# Patient Record
Sex: Female | Born: 1935 | Race: Black or African American | Hispanic: No | Marital: Single
Health system: Southern US, Community
[De-identification: ages and names within clinical notes are randomized; demographics above are authoritative.]

## PROBLEM LIST (undated history)

## (undated) DIAGNOSIS — I639 Cerebral infarction, unspecified: Secondary | ICD-10-CM

## (undated) DIAGNOSIS — R159 Full incontinence of feces: Secondary | ICD-10-CM

## (undated) DIAGNOSIS — F32A Depression, unspecified: Secondary | ICD-10-CM

## (undated) DIAGNOSIS — R32 Unspecified urinary incontinence: Secondary | ICD-10-CM

## (undated) DIAGNOSIS — R519 Headache, unspecified: Secondary | ICD-10-CM

## (undated) DIAGNOSIS — M2550 Pain in unspecified joint: Secondary | ICD-10-CM

## (undated) DIAGNOSIS — R51 Headache: Secondary | ICD-10-CM

## (undated) DIAGNOSIS — K219 Gastro-esophageal reflux disease without esophagitis: Secondary | ICD-10-CM

## (undated) DIAGNOSIS — N3281 Overactive bladder: Secondary | ICD-10-CM

## (undated) DIAGNOSIS — Z95818 Presence of other cardiac implants and grafts: Secondary | ICD-10-CM

## (undated) DIAGNOSIS — I4891 Unspecified atrial fibrillation: Secondary | ICD-10-CM

## (undated) DIAGNOSIS — R011 Cardiac murmur, unspecified: Secondary | ICD-10-CM

## (undated) DIAGNOSIS — E7800 Pure hypercholesterolemia, unspecified: Secondary | ICD-10-CM

## (undated) DIAGNOSIS — G450 Vertebro-basilar artery syndrome: Secondary | ICD-10-CM

## (undated) DIAGNOSIS — D649 Anemia, unspecified: Secondary | ICD-10-CM

## (undated) DIAGNOSIS — K589 Irritable bowel syndrome without diarrhea: Secondary | ICD-10-CM

## (undated) DIAGNOSIS — K5792 Diverticulitis of intestine, part unspecified, without perforation or abscess without bleeding: Secondary | ICD-10-CM

## (undated) DIAGNOSIS — Z9889 Other specified postprocedural states: Secondary | ICD-10-CM

## (undated) DIAGNOSIS — Z860100 Personal history of colon polyps, unspecified: Secondary | ICD-10-CM

## (undated) DIAGNOSIS — G2581 Restless legs syndrome: Secondary | ICD-10-CM

## (undated) DIAGNOSIS — Z8709 Personal history of other diseases of the respiratory system: Secondary | ICD-10-CM

## (undated) DIAGNOSIS — R112 Nausea with vomiting, unspecified: Secondary | ICD-10-CM

## (undated) DIAGNOSIS — N390 Urinary tract infection, site not specified: Secondary | ICD-10-CM

## (undated) DIAGNOSIS — I10 Essential (primary) hypertension: Secondary | ICD-10-CM

## (undated) DIAGNOSIS — M1990 Unspecified osteoarthritis, unspecified site: Secondary | ICD-10-CM

## (undated) DIAGNOSIS — Z950 Presence of cardiac pacemaker: Secondary | ICD-10-CM

## (undated) DIAGNOSIS — F419 Anxiety disorder, unspecified: Secondary | ICD-10-CM

## (undated) DIAGNOSIS — E039 Hypothyroidism, unspecified: Secondary | ICD-10-CM

## (undated) DIAGNOSIS — M2540 Effusion, unspecified joint: Secondary | ICD-10-CM

## (undated) DIAGNOSIS — Z973 Presence of spectacles and contact lenses: Secondary | ICD-10-CM

## (undated) DIAGNOSIS — F329 Major depressive disorder, single episode, unspecified: Secondary | ICD-10-CM

## (undated) DIAGNOSIS — R351 Nocturia: Secondary | ICD-10-CM

## (undated) HISTORY — PX: EYE SURGERY: SHX253

## (undated) HISTORY — PX: ABDOMINAL HYSTERECTOMY: SHX81

## (undated) HISTORY — PX: BREAST SURGERY: SHX581

## (undated) HISTORY — PX: INSERT / REPLACE / REMOVE PACEMAKER: SUR710

## (undated) HISTORY — PX: OTHER SURGICAL HISTORY: SHX169

## (undated) HISTORY — DX: Vertebro-basilar artery syndrome: G45.0

## (undated) HISTORY — PX: COLONOSCOPY: SHX174

---

## 2014-05-07 DIAGNOSIS — Z95818 Presence of other cardiac implants and grafts: Secondary | ICD-10-CM

## 2014-05-07 HISTORY — DX: Presence of other cardiac implants and grafts: Z95.818

## 2014-05-18 ENCOUNTER — Other Ambulatory Visit (HOSPITAL_COMMUNITY): Payer: Self-pay | Admitting: Interventional Radiology

## 2014-05-18 DIAGNOSIS — I651 Occlusion and stenosis of basilar artery: Secondary | ICD-10-CM

## 2014-05-21 ENCOUNTER — Ambulatory Visit (HOSPITAL_COMMUNITY): Admission: RE | Admit: 2014-05-21 | Payer: Self-pay | Source: Ambulatory Visit

## 2014-05-23 HISTORY — PX: LOOP RECORDER IMPLANT: SHX5954

## 2014-05-25 ENCOUNTER — Ambulatory Visit (HOSPITAL_COMMUNITY)
Admission: RE | Admit: 2014-05-25 | Discharge: 2014-05-25 | Disposition: A | Discharge status: Home / Self Care | Payer: Medicare HMO | Source: Ambulatory Visit | Attending: Interventional Radiology | Admitting: Interventional Radiology

## 2014-05-25 ENCOUNTER — Other Ambulatory Visit: Payer: Self-pay | Admitting: Radiology

## 2014-05-25 DIAGNOSIS — Z79899 Other long term (current) drug therapy: Secondary | ICD-10-CM | POA: Insufficient documentation

## 2014-05-25 DIAGNOSIS — G2581 Restless legs syndrome: Secondary | ICD-10-CM | POA: Diagnosis not present

## 2014-05-25 DIAGNOSIS — F329 Major depressive disorder, single episode, unspecified: Secondary | ICD-10-CM | POA: Diagnosis not present

## 2014-05-25 DIAGNOSIS — I6501 Occlusion and stenosis of right vertebral artery: Secondary | ICD-10-CM | POA: Insufficient documentation

## 2014-05-25 DIAGNOSIS — Z7982 Long term (current) use of aspirin: Secondary | ICD-10-CM | POA: Diagnosis not present

## 2014-05-25 DIAGNOSIS — Z8673 Personal history of transient ischemic attack (TIA), and cerebral infarction without residual deficits: Secondary | ICD-10-CM | POA: Diagnosis not present

## 2014-05-25 DIAGNOSIS — R110 Nausea: Secondary | ICD-10-CM | POA: Insufficient documentation

## 2014-05-25 DIAGNOSIS — M170 Bilateral primary osteoarthritis of knee: Secondary | ICD-10-CM | POA: Diagnosis not present

## 2014-05-25 DIAGNOSIS — Z7902 Long term (current) use of antithrombotics/antiplatelets: Secondary | ICD-10-CM | POA: Insufficient documentation

## 2014-05-25 DIAGNOSIS — E780 Pure hypercholesterolemia: Secondary | ICD-10-CM | POA: Diagnosis not present

## 2014-05-25 DIAGNOSIS — I651 Occlusion and stenosis of basilar artery: Secondary | ICD-10-CM | POA: Insufficient documentation

## 2014-05-25 DIAGNOSIS — R42 Dizziness and giddiness: Secondary | ICD-10-CM | POA: Diagnosis present

## 2014-05-25 DIAGNOSIS — I4891 Unspecified atrial fibrillation: Secondary | ICD-10-CM | POA: Insufficient documentation

## 2014-05-25 LAB — PLATELET INHIBITION P2Y12: PLATELET FUNCTION P2Y12: 187 [PRU] — AB (ref 194–418)

## 2014-06-05 ENCOUNTER — Other Ambulatory Visit (HOSPITAL_COMMUNITY): Payer: Self-pay | Admitting: Interventional Radiology

## 2014-06-05 DIAGNOSIS — I651 Occlusion and stenosis of basilar artery: Secondary | ICD-10-CM

## 2014-07-02 ENCOUNTER — Ambulatory Visit (HOSPITAL_COMMUNITY): Admission: RE | Admit: 2014-07-02 | Payer: Medicare HMO | Source: Ambulatory Visit

## 2014-07-05 ENCOUNTER — Encounter (HOSPITAL_COMMUNITY): Payer: Self-pay | Admitting: Pharmacy Technician

## 2014-07-05 ENCOUNTER — Other Ambulatory Visit: Payer: Self-pay | Admitting: Radiology

## 2014-07-11 ENCOUNTER — Other Ambulatory Visit (HOSPITAL_COMMUNITY): Payer: Self-pay | Admitting: *Deleted

## 2014-07-11 ENCOUNTER — Encounter (HOSPITAL_COMMUNITY): Payer: Self-pay

## 2014-07-11 ENCOUNTER — Encounter (HOSPITAL_COMMUNITY)
Admission: RE | Admit: 2014-07-11 | Discharge: 2014-07-11 | Disposition: A | Discharge status: Home / Self Care | Payer: Medicare HMO | Source: Ambulatory Visit | Attending: Interventional Radiology | Admitting: Interventional Radiology

## 2014-07-11 DIAGNOSIS — E039 Hypothyroidism, unspecified: Secondary | ICD-10-CM | POA: Insufficient documentation

## 2014-07-11 DIAGNOSIS — Z01812 Encounter for preprocedural laboratory examination: Secondary | ICD-10-CM | POA: Insufficient documentation

## 2014-07-11 DIAGNOSIS — Z8673 Personal history of transient ischemic attack (TIA), and cerebral infarction without residual deficits: Secondary | ICD-10-CM | POA: Diagnosis not present

## 2014-07-11 DIAGNOSIS — I4891 Unspecified atrial fibrillation: Secondary | ICD-10-CM | POA: Diagnosis not present

## 2014-07-11 DIAGNOSIS — Z01818 Encounter for other preprocedural examination: Secondary | ICD-10-CM | POA: Insufficient documentation

## 2014-07-11 DIAGNOSIS — I10 Essential (primary) hypertension: Secondary | ICD-10-CM | POA: Diagnosis not present

## 2014-07-11 DIAGNOSIS — Z7902 Long term (current) use of antithrombotics/antiplatelets: Secondary | ICD-10-CM | POA: Diagnosis not present

## 2014-07-11 DIAGNOSIS — I651 Occlusion and stenosis of basilar artery: Secondary | ICD-10-CM | POA: Diagnosis not present

## 2014-07-11 HISTORY — DX: Cardiac murmur, unspecified: R01.1

## 2014-07-11 HISTORY — DX: Urinary tract infection, site not specified: N39.0

## 2014-07-11 HISTORY — DX: Unspecified atrial fibrillation: I48.91

## 2014-07-11 HISTORY — DX: Presence of other cardiac implants and grafts: Z95.818

## 2014-07-11 HISTORY — DX: Pure hypercholesterolemia, unspecified: E78.00

## 2014-07-11 HISTORY — DX: Full incontinence of feces: R15.9

## 2014-07-11 HISTORY — DX: Anxiety disorder, unspecified: F41.9

## 2014-07-11 HISTORY — DX: Major depressive disorder, single episode, unspecified: F32.9

## 2014-07-11 HISTORY — DX: Cerebral infarction, unspecified: I63.9

## 2014-07-11 HISTORY — DX: Nausea with vomiting, unspecified: R11.2

## 2014-07-11 HISTORY — DX: Restless legs syndrome: G25.81

## 2014-07-11 HISTORY — DX: Unspecified osteoarthritis, unspecified site: M19.90

## 2014-07-11 HISTORY — DX: Hypothyroidism, unspecified: E03.9

## 2014-07-11 HISTORY — DX: Essential (primary) hypertension: I10

## 2014-07-11 HISTORY — DX: Depression, unspecified: F32.A

## 2014-07-11 HISTORY — DX: Other specified postprocedural states: Z98.890

## 2014-07-11 LAB — COMPREHENSIVE METABOLIC PANEL
ALK PHOS: 79 U/L (ref 39–117)
ALT: 15 U/L (ref 0–35)
AST: 23 U/L (ref 0–37)
Albumin: 3.6 g/dL (ref 3.5–5.2)
Anion gap: 7 (ref 5–15)
BILIRUBIN TOTAL: 0.5 mg/dL (ref 0.3–1.2)
BUN: 8 mg/dL (ref 6–23)
CO2: 30 mmol/L (ref 19–32)
Calcium: 9.3 mg/dL (ref 8.4–10.5)
Chloride: 99 mmol/L (ref 96–112)
Creatinine, Ser: 0.97 mg/dL (ref 0.50–1.10)
GFR, EST AFRICAN AMERICAN: 60 mL/min — AB (ref >90)
GFR, EST NON AFRICAN AMERICAN: 52 mL/min — AB (ref >90)
GLUCOSE: 88 mg/dL (ref 70–99)
POTASSIUM: 4.1 mmol/L (ref 3.5–5.1)
Sodium: 136 mmol/L (ref 135–145)
Total Protein: 6.6 g/dL (ref 6.0–8.3)

## 2014-07-11 LAB — CBC WITH DIFFERENTIAL/PLATELET
Basophils Absolute: 0 10*3/uL (ref 0.0–0.1)
Basophils Relative: 0 % (ref 0–1)
EOS ABS: 0.3 10*3/uL (ref 0.0–0.7)
Eosinophils Relative: 5 % (ref 0–5)
HCT: 35.7 % — ABNORMAL LOW (ref 36.0–46.0)
HEMOGLOBIN: 12.1 g/dL (ref 12.0–15.0)
LYMPHS ABS: 1.9 10*3/uL (ref 0.7–4.0)
LYMPHS PCT: 35 % (ref 12–46)
MCH: 28.3 pg (ref 26.0–34.0)
MCHC: 33.9 g/dL (ref 30.0–36.0)
MCV: 83.6 fL (ref 78.0–100.0)
MONOS PCT: 8 % (ref 3–12)
Monocytes Absolute: 0.5 10*3/uL (ref 0.1–1.0)
NEUTROS PCT: 52 % (ref 43–77)
Neutro Abs: 2.9 10*3/uL (ref 1.7–7.7)
Platelets: 168 10*3/uL (ref 150–400)
RBC: 4.27 MIL/uL (ref 3.87–5.11)
RDW: 13.2 % (ref 11.5–15.5)
WBC: 5.6 10*3/uL (ref 4.0–10.5)

## 2014-07-11 LAB — APTT: aPTT: 33 seconds (ref 24–37)

## 2014-07-11 LAB — PROTIME-INR
INR: 1.05 (ref 0.00–1.49)
Prothrombin Time: 13.9 seconds (ref 11.6–15.2)

## 2014-07-11 LAB — PLATELET INHIBITION P2Y12: Platelet Function  P2Y12: 248 [PRU] (ref 194–418)

## 2014-07-11 NOTE — Progress Notes (Signed)
Pt denies chest pain or sob. Only "heart" issue is that she was dx'ed with A-fib in February. Had loop recorder placed then. Pt's cardiologist is Dr. Elonda Husky with [facility] in [facility].

## 2014-07-11 NOTE — Pre-Procedure Instructions (Signed)
Jade Juarez  07/11/2014   Your procedure is scheduled on:  Monday, July 16, 2014 at 8:30 AM.   Report to [facility] Entrance "A" Admitting Office at 6:30 AM.   Call this number if you have problems the morning of surgery: [phone]               Any questions prior to day of surgery, please call [phone] between 8 & 4 PM.   Remember:   Do not eat food or drink liquids after midnight Sunday, 07/15/14.   Take these medicines the morning of surgery with A SIP OF WATER: Aspirin, Plavix, Diltiazem (Cardizem CD), Gabapentin (Neurontin), Levothyroxine (Synthroid), Sertraline (Zoloft)   Do not wear jewelry, make-up or nail polish.  Do not wear lotions, powders, or perfumes. You may wear deodorant.  Do not shave 48 hours prior to surgery.   Do not bring valuables to the hospital.  [facility] is not responsible                  for any belongings or valuables.               Contacts, dentures or bridgework may not be worn into surgery.  Leave suitcase in the car. After surgery it may be brought to your room.  For patients admitted to the hospital, discharge time is determined by your                treatment team.               Special Instructions: [place] - Preparing for Surgery  Before surgery, you can play an important role.  Because skin is not sterile, your skin needs to be as free of germs as possible.  You can reduce the number of germs on you skin by washing with CHG (chlorahexidine gluconate) soap before surgery.  CHG is an antiseptic cleaner which kills germs and bonds with the skin to continue killing germs even after washing.  Please DO NOT use if you have an allergy to CHG or antibacterial soaps.  If your skin becomes reddened/irritated stop using the CHG and inform your nurse when you arrive at Short Stay.  Do not shave (including legs and underarms) for at least 48 hours prior to the first CHG shower.  You may shave your face.  Please follow these  instructions carefully:   1.  Shower with CHG Soap the night before surgery and the                                morning of Surgery.  2.  If you choose to wash your hair, wash your hair first as usual with your       normal shampoo.  3.  After you shampoo, rinse your hair and body thoroughly to remove the                      Shampoo.  4.  Use CHG as you would any other liquid soap.  You can apply chg directly       to the skin and wash gently with scrungie or a clean washcloth.  5.  Apply the CHG Soap to your body ONLY FROM THE NECK DOWN.        Do not use on open wounds or open sores.  Avoid contact with your eyes, ears, mouth and genitals (private parts).  Wash genitals (private parts) with your normal soap.  6.  Wash thoroughly, paying special attention to the area where your surgery        will be performed.  7.  Thoroughly rinse your body with warm water from the neck down.  8.  DO NOT shower/wash with your normal soap after using and rinsing off       the CHG Soap.  9.  Pat yourself dry with a clean towel.            10.  Wear clean pajamas.            11.  Place clean sheets on your bed the night of your first shower and do not        sleep with pets.  Day of Surgery  Do not apply any lotions the morning of surgery.  Please wear clean clothes to the hospital.     Please read over the following fact sheets that you were given: Pain Booklet, Coughing and Deep Breathing and Surgical Site Infection Prevention

## 2014-07-12 ENCOUNTER — Encounter (HOSPITAL_COMMUNITY): Payer: Self-pay | Admitting: Emergency Medicine

## 2014-07-12 ENCOUNTER — Other Ambulatory Visit: Payer: Self-pay | Admitting: Radiology

## 2014-07-12 NOTE — Progress Notes (Addendum)
Anesthesia Chart Review:  Pt is 81 year old female scheduled for cerebral arteriogram with possible angioplasty/stent of basilar artery stenosis on 07/16/2014 with Dr. Estanislado Pandy.   Cardiologist is Dr. Elonda Husky of [facility].   PMH includes: HTN, atrial fibrillation (per pt, new dx 05/2014), s/p placement loop recorder 05/2014, heart murmur, stroke, hypothyroidism. Never smoker. BMI 31.   Medications include: Plavix. Pt is not to stop plavix.   Preoperative labs reviewed.    EKG: one was obtained from her cardiologist's office, but is essentially illegible.  Will need to obtain DOS.   Echo 12/27/2013: -Mild concentric LV hypertrophy  -Normal LV systolic function with no appreciable segmental abnormality -EF is visually estimated at 54-00% -Grade I diastolic dysfunction -Normal LV structure and function -Mild aortic regurgitation  Pt last saw Dr. Elonda Husky 06/27/2014. Her loop recorder demonstrated multiple episodes of atrial fibrillation as long as several hours each. He recommended she stop plavix and start eliquis for stroke prevention, but I do not see that this has been done yet. His note indicates he is aware of her upcoming procedure.   If EKG stable DOS, I anticipate pt can proceed with surgery as scheduled.   Willeen Cass, FNP-BC [facility] Short Stay Surgical Center/Anesthesiology Phone: [phone] 07/13/2014 1:43 PM

## 2014-07-13 ENCOUNTER — Encounter (HOSPITAL_COMMUNITY): Payer: Self-pay

## 2014-07-16 ENCOUNTER — Inpatient Hospital Stay (HOSPITAL_COMMUNITY): Admission: RE | Admit: 2014-07-16 | Payer: Medicare HMO | Source: Ambulatory Visit

## 2014-07-16 ENCOUNTER — Ambulatory Visit (HOSPITAL_COMMUNITY): Admission: RE | Admit: 2014-07-16 | Payer: Medicare HMO | Source: Ambulatory Visit | Admitting: Interventional Radiology

## 2014-07-16 ENCOUNTER — Encounter (HOSPITAL_COMMUNITY): Admission: RE | Payer: Self-pay | Source: Ambulatory Visit

## 2014-07-16 SURGERY — RADIOLOGY WITH ANESTHESIA
Anesthesia: General

## 2014-07-30 ENCOUNTER — Ambulatory Visit (HOSPITAL_COMMUNITY): Admission: RE | Admit: 2014-07-30 | Payer: Medicare HMO | Source: Ambulatory Visit

## 2014-08-02 ENCOUNTER — Other Ambulatory Visit: Payer: Self-pay | Admitting: Radiology

## 2014-08-09 ENCOUNTER — Encounter (HOSPITAL_COMMUNITY)
Admission: RE | Admit: 2014-08-09 | Discharge: 2014-08-09 | Disposition: A | Discharge status: Home / Self Care | Payer: Medicare HMO | Source: Ambulatory Visit | Attending: Interventional Radiology | Admitting: Interventional Radiology

## 2014-08-09 ENCOUNTER — Encounter (HOSPITAL_COMMUNITY): Payer: Self-pay

## 2014-08-09 DIAGNOSIS — I10 Essential (primary) hypertension: Secondary | ICD-10-CM | POA: Insufficient documentation

## 2014-08-09 DIAGNOSIS — E039 Hypothyroidism, unspecified: Secondary | ICD-10-CM | POA: Diagnosis not present

## 2014-08-09 DIAGNOSIS — Z7902 Long term (current) use of antithrombotics/antiplatelets: Secondary | ICD-10-CM | POA: Insufficient documentation

## 2014-08-09 DIAGNOSIS — Z01812 Encounter for preprocedural laboratory examination: Secondary | ICD-10-CM | POA: Insufficient documentation

## 2014-08-09 DIAGNOSIS — Z01818 Encounter for other preprocedural examination: Secondary | ICD-10-CM | POA: Diagnosis present

## 2014-08-09 DIAGNOSIS — Z8673 Personal history of transient ischemic attack (TIA), and cerebral infarction without residual deficits: Secondary | ICD-10-CM | POA: Diagnosis not present

## 2014-08-09 DIAGNOSIS — I651 Occlusion and stenosis of basilar artery: Secondary | ICD-10-CM | POA: Diagnosis not present

## 2014-08-09 DIAGNOSIS — I4891 Unspecified atrial fibrillation: Secondary | ICD-10-CM | POA: Insufficient documentation

## 2014-08-09 HISTORY — DX: Headache: R51

## 2014-08-09 HISTORY — DX: Personal history of colonic polyps: Z86.010

## 2014-08-09 HISTORY — DX: Overactive bladder: N32.81

## 2014-08-09 HISTORY — DX: Nocturia: R35.1

## 2014-08-09 HISTORY — DX: Unspecified urinary incontinence: R32

## 2014-08-09 HISTORY — DX: Diverticulitis of intestine, part unspecified, without perforation or abscess without bleeding: K57.92

## 2014-08-09 HISTORY — DX: Effusion, unspecified joint: M25.40

## 2014-08-09 HISTORY — DX: Headache, unspecified: R51.9

## 2014-08-09 HISTORY — DX: Personal history of other diseases of the respiratory system: Z87.09

## 2014-08-09 HISTORY — DX: Gastro-esophageal reflux disease without esophagitis: K21.9

## 2014-08-09 HISTORY — DX: Anemia, unspecified: D64.9

## 2014-08-09 HISTORY — DX: Personal history of colon polyps, unspecified: Z86.0100

## 2014-08-09 HISTORY — DX: Pain in unspecified joint: M25.50

## 2014-08-09 LAB — PLATELET INHIBITION P2Y12: Platelet Function  P2Y12: 215 [PRU] (ref 194–418)

## 2014-08-09 LAB — BASIC METABOLIC PANEL
Anion gap: 9 (ref 5–15)
BUN: 11 mg/dL (ref 6–20)
CO2: 26 mmol/L (ref 22–32)
CREATININE: 0.89 mg/dL (ref 0.44–1.00)
Calcium: 9.4 mg/dL (ref 8.9–10.3)
Chloride: 100 mmol/L — ABNORMAL LOW (ref 101–111)
GFR, EST NON AFRICAN AMERICAN: 57 mL/min — AB (ref >60)
GLUCOSE: 100 mg/dL — AB (ref 70–99)
Potassium: 4.1 mmol/L (ref 3.5–5.1)
Sodium: 135 mmol/L (ref 135–145)

## 2014-08-09 LAB — CBC
HCT: 37.2 % (ref 36.0–46.0)
Hemoglobin: 12.7 g/dL (ref 12.0–15.0)
MCH: 28.7 pg (ref 26.0–34.0)
MCHC: 34.1 g/dL (ref 30.0–36.0)
MCV: 84.2 fL (ref 78.0–100.0)
PLATELETS: 183 10*3/uL (ref 150–400)
RBC: 4.42 MIL/uL (ref 3.87–5.11)
RDW: 13.6 % (ref 11.5–15.5)
WBC: 6.2 10*3/uL (ref 4.0–10.5)

## 2014-08-09 NOTE — Progress Notes (Signed)
Dr.Tyson and Dr.mCGukin is with [facility] and last visit about 6wk-2 months ago-to request records  Echo done within past 2 yrs ago to be requested  Stress test to be requested   Denies ever having a heart cath  EKG to be requested from [facility]  CXR to be requested from [facility]

## 2014-08-09 NOTE — Pre-Procedure Instructions (Signed)
Nomie Buchberger  08/09/2014   Your procedure is scheduled on:  Wed, May 11 @ 8:30 AM  Report to [facility] Entrance A  at 6:30 AM.  Call this number if you have problems the morning of surgery: [phone]   Remember:   Do not eat food or drink liquids after midnight.   Take these medicines the morning of surgery with A SIP OF WATER: Aspirin,Plavix,Synthroid(Levothyroxine),and Sertraline(Zoloft)               No Goody's,BC's,Aleve,Ibuprofen,Fish Oil,or any Herbal Medications.   Do not wear jewelry, make-up or nail polish.  Do not wear lotions, powders, or perfumes. You may wear deodorant.  Do not shave 48 hours prior to surgery.   Do not bring valuables to the hospital.  [facility] is not responsible                  for any belongings or valuables.               Contacts, dentures or bridgework may not be worn into surgery.  Leave suitcase in the car. After surgery it may be brought to your room.  For patients admitted to the hospital, discharge time is determined by your                treatment team.               Patients discharged the day of surgery will not be allowed to drive  home.    Special Instructions:  [place] - Preparing for Surgery  Before surgery, you can play an important role.  Because skin is not sterile, your skin needs to be as free of germs as possible.  You can reduce the number of germs on you skin by washing with CHG (chlorahexidine gluconate) soap before surgery.  CHG is an antiseptic cleaner which kills germs and bonds with the skin to continue killing germs even after washing.  Please DO NOT use if you have an allergy to CHG or antibacterial soaps.  If your skin becomes reddened/irritated stop using the CHG and inform your nurse when you arrive at Short Stay.  Do not shave (including legs and underarms) for at least 48 hours prior to the first CHG shower.  You may shave your face.  Please follow these instructions carefully:   1.  Shower with CHG Soap  the night before surgery and the                                morning of Surgery.  2.  If you choose to wash your hair, wash your hair first as usual with your       normal shampoo.  3.  After you shampoo, rinse your hair and body thoroughly to remove the                      Shampoo.  4.  Use CHG as you would any other liquid soap.  You can apply chg directly       to the skin and wash gently with scrungie or a clean washcloth.  5.  Apply the CHG Soap to your body ONLY FROM THE NECK DOWN.        Do not use on open wounds or open sores.  Avoid contact with your eyes,       ears, mouth and genitals (private parts).  Wash genitals (private parts)       with your normal soap.  6.  Wash thoroughly, paying special attention to the area where your surgery        will be performed.  7.  Thoroughly rinse your body with warm water from the neck down.  8.  DO NOT shower/wash with your normal soap after using and rinsing off       the CHG Soap.  9.  Pat yourself dry with a clean towel.            10.  Wear clean pajamas.            11.  Place clean sheets on your bed the night of your first shower and do not        sleep with pets.  Day of Surgery  Do not apply any lotions/deoderants the morning of surgery.  Please wear clean clothes to the hospital/surgery center.     Please read over the following fact sheets that you were given: Pain Booklet, Coughing and Deep Breathing and Surgical Site Infection Prevention

## 2014-08-10 NOTE — Progress Notes (Addendum)
Anesthesia Chart Review:  Pt is 81 year old female scheduled for endovascular revascularization of the mid basilar artery for 95% plus stenosis on 08/15/2014 with Dr. Estanislado Pandy.   See my note dated  07/12/2014.   Cardiologist is Dr. Elonda Husky of [facility].   PMH includes: HTN, atrial fibrillation (per pt, new dx 05/2014), s/p placement loop recorder 05/2014, heart murmur, stroke, hypothyroidism. Never smoker. BMI 31.   Medications include: Plavix. Pt is not to stop plavix.   Preoperative labs reviewed.   Chest x-ray report 07/26/2014 reviewed. Mild hyperinflation. No active disease.   EKG: one was obtained from her cardiologist's office, but is essentially illegible. Will need to obtain DOS.   Requested updates from [facility]. Pt had loop recorder check on 07/23/2014, otherwise no updates.   If EKG acceptable DOS, I anticipate pt can proceed as scheduled.   Willeen Cass, FNP-BC [facility] Short Stay Surgical Center/Anesthesiology Phone: [phone] 08/14/2014 2:34 PM

## 2014-08-14 ENCOUNTER — Other Ambulatory Visit (HOSPITAL_COMMUNITY): Payer: Self-pay | Admitting: Physician Assistant

## 2014-08-14 ENCOUNTER — Other Ambulatory Visit: Payer: Self-pay | Admitting: Radiology

## 2014-08-15 ENCOUNTER — Encounter (HOSPITAL_COMMUNITY): Payer: Self-pay | Admitting: Anesthesiology

## 2014-08-15 ENCOUNTER — Ambulatory Visit (HOSPITAL_COMMUNITY): Payer: Medicare HMO | Admitting: Emergency Medicine

## 2014-08-15 ENCOUNTER — Other Ambulatory Visit: Payer: Self-pay

## 2014-08-15 ENCOUNTER — Encounter (HOSPITAL_COMMUNITY): Payer: Self-pay

## 2014-08-15 ENCOUNTER — Ambulatory Visit (HOSPITAL_COMMUNITY)
Admission: RE | Admit: 2014-08-15 | Discharge: 2014-08-15 | Disposition: A | Discharge status: Home / Self Care | Payer: Medicare HMO | Source: Ambulatory Visit | Attending: Interventional Radiology | Admitting: Interventional Radiology

## 2014-08-15 ENCOUNTER — Encounter (HOSPITAL_COMMUNITY)
Admission: RE | Disposition: A | Discharge status: Home / Self Care | Payer: Self-pay | Source: Ambulatory Visit | Attending: Interventional Radiology

## 2014-08-15 ENCOUNTER — Ambulatory Visit (HOSPITAL_COMMUNITY): Payer: Medicare HMO | Admitting: Anesthesiology

## 2014-08-15 DIAGNOSIS — I4891 Unspecified atrial fibrillation: Secondary | ICD-10-CM | POA: Insufficient documentation

## 2014-08-15 DIAGNOSIS — E039 Hypothyroidism, unspecified: Secondary | ICD-10-CM | POA: Diagnosis not present

## 2014-08-15 DIAGNOSIS — D649 Anemia, unspecified: Secondary | ICD-10-CM | POA: Diagnosis not present

## 2014-08-15 DIAGNOSIS — Z888 Allergy status to other drugs, medicaments and biological substances status: Secondary | ICD-10-CM | POA: Insufficient documentation

## 2014-08-15 DIAGNOSIS — Z7901 Long term (current) use of anticoagulants: Secondary | ICD-10-CM | POA: Insufficient documentation

## 2014-08-15 DIAGNOSIS — M13862 Other specified arthritis, left knee: Secondary | ICD-10-CM | POA: Insufficient documentation

## 2014-08-15 DIAGNOSIS — K219 Gastro-esophageal reflux disease without esophagitis: Secondary | ICD-10-CM | POA: Diagnosis not present

## 2014-08-15 DIAGNOSIS — I10 Essential (primary) hypertension: Secondary | ICD-10-CM | POA: Diagnosis not present

## 2014-08-15 DIAGNOSIS — Z881 Allergy status to other antibiotic agents status: Secondary | ICD-10-CM | POA: Insufficient documentation

## 2014-08-15 DIAGNOSIS — Z886 Allergy status to analgesic agent status: Secondary | ICD-10-CM | POA: Diagnosis not present

## 2014-08-15 DIAGNOSIS — G2581 Restless legs syndrome: Secondary | ICD-10-CM | POA: Diagnosis not present

## 2014-08-15 DIAGNOSIS — Z95810 Presence of automatic (implantable) cardiac defibrillator: Secondary | ICD-10-CM | POA: Diagnosis not present

## 2014-08-15 DIAGNOSIS — F419 Anxiety disorder, unspecified: Secondary | ICD-10-CM | POA: Insufficient documentation

## 2014-08-15 DIAGNOSIS — Z7982 Long term (current) use of aspirin: Secondary | ICD-10-CM | POA: Diagnosis not present

## 2014-08-15 DIAGNOSIS — E780 Pure hypercholesterolemia: Secondary | ICD-10-CM | POA: Diagnosis not present

## 2014-08-15 DIAGNOSIS — Z86010 Personal history of colonic polyps: Secondary | ICD-10-CM | POA: Diagnosis not present

## 2014-08-15 DIAGNOSIS — M13861 Other specified arthritis, right knee: Secondary | ICD-10-CM | POA: Diagnosis not present

## 2014-08-15 DIAGNOSIS — Z90710 Acquired absence of both cervix and uterus: Secondary | ICD-10-CM | POA: Insufficient documentation

## 2014-08-15 DIAGNOSIS — I668 Occlusion and stenosis of other cerebral arteries: Secondary | ICD-10-CM | POA: Diagnosis not present

## 2014-08-15 DIAGNOSIS — Z87440 Personal history of urinary (tract) infections: Secondary | ICD-10-CM | POA: Diagnosis not present

## 2014-08-15 DIAGNOSIS — F329 Major depressive disorder, single episode, unspecified: Secondary | ICD-10-CM | POA: Insufficient documentation

## 2014-08-15 DIAGNOSIS — I651 Occlusion and stenosis of basilar artery: Secondary | ICD-10-CM

## 2014-08-15 DIAGNOSIS — Z8673 Personal history of transient ischemic attack (TIA), and cerebral infarction without residual deficits: Secondary | ICD-10-CM | POA: Diagnosis not present

## 2014-08-15 DIAGNOSIS — R42 Dizziness and giddiness: Secondary | ICD-10-CM | POA: Diagnosis present

## 2014-08-15 HISTORY — PX: RADIOLOGY WITH ANESTHESIA: SHX6223

## 2014-08-15 LAB — PLATELET INHIBITION P2Y12: PLATELET FUNCTION P2Y12: 191 [PRU] — AB (ref 194–418)

## 2014-08-15 SURGERY — RADIOLOGY WITH ANESTHESIA
Anesthesia: Monitor Anesthesia Care

## 2014-08-15 MED ORDER — IOHEXOL 300 MG/ML  SOLN
200.0000 mL | Freq: Once | INTRAMUSCULAR | Status: AC | PRN
  Administered 2014-08-15: 90 mL via INTRA_ARTERIAL

## 2014-08-15 MED ORDER — LIDOCAINE HCL 1 % IJ SOLN
INTRAMUSCULAR | Status: AC
  Filled 2014-08-15: qty 20

## 2014-08-15 MED ORDER — HYDRALAZINE HCL 20 MG/ML IJ SOLN
INTRAMUSCULAR | Status: DC | PRN
  Administered 2014-08-15: 8 mg via INTRAVENOUS

## 2014-08-15 MED ORDER — LACTATED RINGERS IV SOLN
INTRAVENOUS | Status: DC
  Administered 2014-08-15 (×2): via INTRAVENOUS

## 2014-08-15 MED ORDER — FENTANYL CITRATE (PF) 100 MCG/2ML IJ SOLN
INTRAMUSCULAR | Status: DC | PRN
  Administered 2014-08-15: 50 ug via INTRAVENOUS

## 2014-08-15 MED ORDER — GLYCOPYRROLATE 0.2 MG/ML IJ SOLN
INTRAMUSCULAR | Status: DC | PRN
  Administered 2014-08-15: 0.2 mg via INTRAVENOUS

## 2014-08-15 MED ORDER — CLOPIDOGREL BISULFATE 75 MG PO TABS: 75.0000 mg | ORAL_TABLET | ORAL | Status: DC

## 2014-08-15 MED ORDER — SODIUM CHLORIDE 0.9 % IV SOLN
INTRAVENOUS | Status: AC
  Administered 2014-08-15: 14:00:00 via INTRAVENOUS

## 2014-08-15 MED ORDER — CEFAZOLIN SODIUM-DEXTROSE 2-3 GM-% IV SOLR
INTRAVENOUS | Status: AC
  Administered 2014-08-15: 2 g via INTRAVENOUS
  Filled 2014-08-15: qty 50

## 2014-08-15 MED ORDER — HEPARIN SODIUM (PORCINE) 1000 UNIT/ML IJ SOLN
INTRAMUSCULAR | Status: DC | PRN
  Administered 2014-08-15: 1000 [IU] via INTRAVENOUS

## 2014-08-15 MED ORDER — MIDAZOLAM HCL 5 MG/5ML IJ SOLN
INTRAMUSCULAR | Status: DC | PRN
  Administered 2014-08-15: 1 mg via INTRAVENOUS

## 2014-08-15 MED ORDER — ONDANSETRON HCL 4 MG/2ML IJ SOLN
INTRAMUSCULAR | Status: DC | PRN
  Administered 2014-08-15 (×2): 4 mg via INTRAVENOUS

## 2014-08-15 MED ORDER — ASPIRIN EC 325 MG PO TBEC: 325.0000 mg | DELAYED_RELEASE_TABLET | ORAL | Status: DC

## 2014-08-15 MED ORDER — SODIUM CHLORIDE 0.9 % IV SOLN
Freq: Once | INTRAVENOUS | Status: AC
  Administered 2014-08-15: 12:00:00 via INTRAVENOUS

## 2014-08-15 MED ORDER — NIMODIPINE 30 MG PO CAPS: 0.0000 mg | ORAL_CAPSULE | ORAL | Status: DC

## 2014-08-15 MED ORDER — CEFAZOLIN SODIUM-DEXTROSE 2-3 GM-% IV SOLR: 2.0000 g | Freq: Once | INTRAVENOUS | Status: DC

## 2014-08-15 NOTE — Sedation Documentation (Signed)
Report called to Toni Amend RN

## 2014-08-15 NOTE — Anesthesia Preprocedure Evaluation (Signed)
Anesthesia Evaluation  Patient identified by MRN, date of birth, ID band Patient awake    Reviewed: Allergy & Precautions, NPO status , Patient's Chart, lab work & pertinent test results  History of Anesthesia Complications (+) PONV  Airway Mallampati: I       Dental   Pulmonary    Pulmonary exam normal       Cardiovascular hypertension, + dysrhythmias Atrial Fibrillation Rhythm:Irregular Rate:Abnormal     Neuro/Psych  Headaches,  Neuromuscular disease CVA    GI/Hepatic GERD-  ,  Endo/Other  Hypothyroidism   Renal/GU      Musculoskeletal  (+) Arthritis -,   Abdominal   Peds  Hematology   Anesthesia Other Findings   Reproductive/Obstetrics                             Anesthesia Physical Anesthesia Plan  ASA: III  Anesthesia Plan: General and MAC   Post-op Pain Management:    Induction: Intravenous  Airway Management Planned: Oral ETT  Additional Equipment:   Intra-op Plan:   Post-operative Plan: Extubation in OR  Informed Consent: I have reviewed the patients History and Physical, chart, labs and discussed the procedure including the risks, benefits and alternatives for the proposed anesthesia with the patient or authorized representative who has indicated his/her understanding and acceptance.     Plan Discussed with: CRNA, Anesthesiologist and Surgeon  Anesthesia Plan Comments:         Anesthesia Quick Evaluation

## 2014-08-15 NOTE — Discharge Instructions (Signed)
Arteriogram °Care After °These instructions give you information on caring for yourself after your procedure. Your doctor may also give you more specific instructions. Call your doctor if you have any problems or questions after your procedure. °HOME CARE °· Do not bathe, swim, or use a hot tub until directed by your doctor. You can shower. °· Do not lift anything heavier than 10 pounds (about a gallon of milk) for 2 days. °· Do not walk a lot, run, or drive for 2 days. °· Return to normal activities in 2 days or as told by your doctor. °Finding out the results of your test °Ask when your test results will be ready. Make sure you get your test results. °GET HELP RIGHT AWAY IF:  °· You have fever. °· You have more pain in your leg. °· The leg that was cut is: °¨ Bleeding. °¨ Puffy (swollen) or red. °¨ Cold. °¨ Pale or changes color. °¨ Weak. °¨ Tingly or numb. °If you go to the Emergency Room, tell your nurse that you have had an arteriogram. Take this paper with you to show the nurse. °MAKE SURE YOU: °· Understand these instructions. °· Will watch your condition. °· Will get help right away if you are not doing well or get worse. °Document Released: 06/19/2008 Document Revised: 03/28/2013 Document Reviewed: 06/19/2008 °ExitCare® Patient Information ©2015 [employer]. This information is not intended to replace advice given to you by your health care provider. Make sure you discuss any questions you have with your health care provider. ° °

## 2014-08-15 NOTE — Sedation Documentation (Signed)
Anesthesia present for procedure.

## 2014-08-15 NOTE — Procedures (Signed)
S/p 4 vessel cerebral arteriogram. RT CFA approach. Findings. 1.Severe string sign 95 % plus stenosis of mid basilar artery. 2.%0 5 stenosis of RT VA origoin. 3.Approx 50 % stenosis of sup division of LT MCA  At  origin

## 2014-08-15 NOTE — Transfer of Care (Signed)
Immediate Anesthesia Transfer of Care Note  Patient: Jade Juarez  Procedure(s) Performed: Procedure(s): RADIOLOGY WITH ANESTHESIA (N/A)  Patient Location: Short Stay  Anesthesia Type:MAC  Level of Consciousness: awake, alert , oriented and patient cooperative  Airway & Oxygen Therapy: Patient Spontanous Breathing and Patient connected to nasal cannula oxygen  Post-op Assessment: Report given to RN and Post -op Vital signs reviewed and stable  Post vital signs: Reviewed and stable  Last Vitals:  Filed Vitals:   08/15/14 1346  BP: 102/49  Pulse: 70  Temp: 36.8 C  Resp: 20    Complications: No apparent anesthesia complications

## 2014-08-15 NOTE — H&P (Signed)
Chief Complaint: Dizziness; slurred speech Basilar artery stenosis  Referring Physician(s): Dr Freddie Breech  History of Present Illness: Jade Juarez is a 81 y.o. female   Dizziness; vertigo;slurred speech Developed 04/2014 Work up with Dr Freddie Breech revealed CT findings of mid basilar artery stenosis Referred to Dr Ulanda Edison 05/2014 for cerebral arteriogram with possible angioplasty/stent of Basilar artery Pt on ASA and Palvix p2y12 has been in 248 and 215  last 2 checks Procedure has been rescheduled secondary to these levels Rechecking this level now    Past Medical History  Diagnosis Date  . Atrial fibrillation     prescribed Eliquis but hasn't started-waiting until after this procedure per [place]  . High cholesterol     takes Atorvastatin daily  . Anxiety   . Frequent UTI   . Heart murmur   . Restless leg syndrome   . Stool incontinence     at times  . Status post placement of implantable loop recorder 05/2014  . PONV (postoperative nausea and vomiting)   . Hypertension     takes Diltiazem and Diovan daily  . Hypothyroidism     takes Synthroid daily  . Depression     takes Zoloft daily  . Anemia     takes Ferrous Sulfate daily  . History of bronchitis     about a yr ago  . Headache     occasionally  . Stroke     takes Plavix daily-right sided weakness  . Arthritis     right knees  . Joint pain   . Joint swelling   . GERD (gastroesophageal reflux disease)     hx of-yrs ago  . History of colon polyps   . Diverticulitis     hx of  . Overactive bladder   . Incontinence   . Nocturia     Past Surgical History  Procedure Laterality Date  . Eye surgery Bilateral     cataract surgery  . Abdominal hysterectomy    . Bladder tack      x 2  . Colonoscopy    . Breast surgery Right     cyst removed  . Loop recorder implant  05/23/2014    [employer]onic Reveal loop recorder (Dr. Lisa Roca, [facility])    Allergies: Hibiclens; Benicar; Codeine;  Erythromycin; Lipitor; Other; Tetracyclines & related; Vibramycin; and Vicodin  Medications: Prior to Admission medications   Medication Sig Start Date End Date Taking? Authorizing Provider  amoxicillin (AMOXIL) 500 MG capsule Take 2,000 mg by mouth See admin instructions. 1 hour before dental procedure 06/17/14  Yes Historical Provider, MD  aspirin EC 325 MG tablet Take 325 mg by mouth daily.   Yes Historical Provider, MD  atorvastatin (LIPITOR) 10 MG tablet Take 10 mg by mouth at bedtime.    Yes Historical Provider, MD  calcium carbonate (OS-CAL) 600 MG TABS tablet Take 600 mg by mouth daily.   Yes Historical Provider, MD  Cholecalciferol (VITAMIN D3) 5000 UNITS CAPS Take 5,000 Units by mouth daily.   Yes Historical Provider, MD  clopidogrel (PLAVIX) 75 MG tablet Take 75 mg by mouth daily.   Yes Historical Provider, MD  diltiazem (CARDIZEM CD) 120 MG 24 hr capsule Take 120 mg by mouth at bedtime.    Yes Historical Provider, MD  ferrous sulfate 325 (65 FE) MG tablet Take 325 mg by mouth daily with breakfast.   Yes Historical Provider, MD  gabapentin (NEURONTIN) 400 MG capsule Take 400 mg by mouth at bedtime.  Yes Historical Provider, MD  levothyroxine (SYNTHROID, LEVOTHROID) 88 MCG tablet Take 88 mcg by mouth daily before breakfast.   Yes Historical Provider, MD  sertraline (ZOLOFT) 50 MG tablet Take 50 mg by mouth daily.   Yes Historical Provider, MD  valsartan-hydrochlorothiazide (DIOVAN-HCT) 160-12.5 MG per tablet Take 1 tablet by mouth daily.   Yes Historical Provider, MD     Family History  Problem Relation Age of Onset  . Hypertension Mother   . Heart attack Mother   . Cancer Father     History   Social History  . Marital Status: Widowed    Spouse Name: N/A  . Number of Children: N/A  . Years of Education: N/A   Social History Main Topics  . Smoking status: Never Smoker   . Smokeless tobacco: Never Used  . Alcohol Use: No  . Drug Use: No  . Sexual Activity: Not on file     Other Topics Concern  . None   Social History Narrative     Review of Systems: A 12 point ROS discussed and pertinent positives are indicated in the HPI above.  All other systems are negative.  Review of Systems  Constitutional: Positive for activity change and fatigue. Negative for fever, appetite change and unexpected weight change.  HENT: Negative for sore throat, tinnitus and trouble swallowing.   Eyes: Positive for visual disturbance.  Respiratory: Negative for cough, chest tightness and shortness of breath.   Cardiovascular: Negative for chest pain.  Gastrointestinal: Negative for nausea and vomiting.  Genitourinary: Negative for difficulty urinating.  Neurological: Positive for dizziness, weakness and light-headedness. Negative for tremors, seizures, syncope, facial asymmetry, speech difficulty, numbness and headaches.  Psychiatric/Behavioral: Negative for behavioral problems and confusion.    Vital Signs: BP 171/88 mmHg  Pulse 57  Temp(Src) 97.8 F (36.6 C) (Oral)  Resp 18  Ht 5\' 2"  (1.575 m)  Wt 75.751 kg (167 lb)  BMI 30.54 kg/m2  SpO2 99%  Physical Exam  Constitutional: She is oriented to person, place, and time. She appears well-developed and well-nourished.  Eyes: EOM are normal.  Cardiovascular:  No murmur heard. irreg rate  Pulmonary/Chest: Effort normal and breath sounds normal. She has no wheezes.  Abdominal: Bowel sounds are normal. There is no tenderness.  Musculoskeletal: Normal range of motion.  Neurological: She is alert and oriented to person, place, and time.  Skin: Skin is warm and dry.  Psychiatric: She has a normal mood and affect. Her behavior is normal. Thought content normal.  Nursing note and vitals reviewed.   Mallampati Score:  MD Evaluation Airway: WNL Heart: WNL Abdomen: WNL Chest/ Lungs: WNL ASA  Classification: 3 Mallampati/Airway Score: One  Imaging: No results found.  Labs:  CBC:  Recent Labs  07/11/14 1542  08/09/14 1517  WBC 5.6 6.2  HGB 12.1 12.7  HCT 35.7* 37.2  PLT 168 183    COAGS:  Recent Labs  07/11/14 1542  INR 1.05  APTT 33    BMP:  Recent Labs  07/11/14 1542 08/09/14 1517  NA 136 135  K 4.1 4.1  CL 99 100*  CO2 30 26  GLUCOSE 88 100*  BUN 8 11  CALCIUM 9.3 9.4  CREATININE 0.97 0.89  GFRNONAA 52* 57*  GFRAA 60* >60    LIVER FUNCTION TESTS:  Recent Labs  07/11/14 1542  BILITOT 0.5  AST 23  ALT 15  ALKPHOS 79  PROT 6.6  ALBUMIN 3.6    TUMOR MARKERS: No results for input(s):  AFPTM, CEA, CA199, CHROMGRNA in the last 8760 hours.  Assessment and Plan:  Dizziness and vertigo started 04/2014 Basilar artery stenosis on CT Consulted with Dr Estanislado Pandy 05/2014 for arteriogram with poss pta/stent Pt now prepared for procedure p2y12 still pending We will move forward with arteriogram today---only move forward with pta/stent dependent on p2y12 level Pt aware of plan Risks and Benefits discussed with the patient including, but not limited to bleeding, infection, vascular injury, contrast induced renal failure, stroke or even death. All of the patient's questions were answered, patient is agreeable to proceed. Consent signed and in chart. Pt will be admitted to Neuro ICU if procedure intervention is performed Plan for discharge in am  Thank you for this interesting consult.  I greatly enjoyed meeting Kitrina Maurin and look forward to participating in their care.  Signed: Hoa Briggs A 08/15/2014, 11:34 AM   I spent a total of  20 Minutes   in face to face in clinical consultation, greater than 50% of which was counseling/coordinating care for cerebral arteriogram with poss pta/stent basilar artery

## 2014-08-15 NOTE — Progress Notes (Addendum)
Jade Juarez called related to heart rate 57 and BP 171/88. States to hold Nimotop for now and he will call be later if med is needed.

## 2014-08-15 NOTE — Anesthesia Postprocedure Evaluation (Signed)
  Anesthesia Post-op Note  Patient: Jade Juarez  Procedure(s) Performed: Procedure(s): RADIOLOGY WITH ANESTHESIA (N/A)  Patient Location: PACU  Anesthesia Type:MAC  Level of Consciousness: awake, alert , oriented and patient cooperative  Airway and Oxygen Therapy: Patient Spontanous Breathing  Post-op Pain: none  Post-op Assessment: Post-op Vital signs reviewed, Patient's Cardiovascular Status Stable, Respiratory Function Stable, Patent Airway, No signs of Nausea or vomiting and Pain level controlled  Post-op Vital Signs: stable  Last Vitals:  Filed Vitals:   08/15/14 1346  BP: 102/49  Pulse: 70  Temp: 36.8 C  Resp: 20    Complications: No apparent anesthesia complications

## 2014-08-16 ENCOUNTER — Other Ambulatory Visit: Payer: Self-pay | Admitting: Radiology

## 2014-08-16 ENCOUNTER — Encounter (HOSPITAL_COMMUNITY): Payer: Self-pay | Admitting: Interventional Radiology

## 2014-08-17 ENCOUNTER — Telehealth (HOSPITAL_COMMUNITY): Payer: Self-pay | Admitting: Interventional Radiology

## 2014-08-17 NOTE — Telephone Encounter (Signed)
Pt's daughter called and stated that since Dr. Estanislado Pandy changed her Plavix 75mg  dosage to 1 tablet one day and 1 1/2 tablets the next day she is about to run of this medication. I told her I would call in a script with the new dosage today. Called in Plavix 75mg  1 tablet 1 day and 1 1/2 tablet the next day - alternating dosage x45 tablets with 3 refills to [facility] on [address] JM

## 2014-08-20 LAB — PLATELET INHIBITION P2Y12: Platelet Function  P2Y12: 247 [PRU] (ref 194–418)

## 2014-08-27 ENCOUNTER — Other Ambulatory Visit: Payer: Self-pay | Admitting: Radiology

## 2014-08-27 LAB — PLATELET INHIBITION P2Y12: Platelet Function  P2Y12: 234 [PRU] (ref 194–418)

## 2014-10-02 ENCOUNTER — Other Ambulatory Visit: Payer: Self-pay | Admitting: Radiology

## 2014-10-02 LAB — PLATELET INHIBITION P2Y12: PLATELET FUNCTION P2Y12: 170 [PRU] — AB (ref 194–418)

## 2014-10-05 ENCOUNTER — Telehealth (HOSPITAL_COMMUNITY): Payer: Self-pay

## 2014-10-24 ENCOUNTER — Telehealth (HOSPITAL_COMMUNITY): Payer: Self-pay | Admitting: Interventional Radiology

## 2014-10-24 ENCOUNTER — Other Ambulatory Visit (HOSPITAL_COMMUNITY): Payer: Self-pay | Admitting: Interventional Radiology

## 2014-10-24 DIAGNOSIS — I651 Occlusion and stenosis of basilar artery: Secondary | ICD-10-CM

## 2014-10-24 NOTE — Telephone Encounter (Signed)
Tried to call pt's daughter to schedule procedure. No answer, no VM. JM

## 2014-11-01 ENCOUNTER — Encounter (HOSPITAL_COMMUNITY)
Admission: RE | Admit: 2014-11-01 | Discharge: 2014-11-01 | Disposition: A | Discharge status: Home / Self Care | Payer: Medicare HMO | Source: Ambulatory Visit | Attending: Interventional Radiology | Admitting: Interventional Radiology

## 2014-11-01 ENCOUNTER — Encounter (HOSPITAL_COMMUNITY): Payer: Self-pay

## 2014-11-01 DIAGNOSIS — I6501 Occlusion and stenosis of right vertebral artery: Secondary | ICD-10-CM | POA: Diagnosis not present

## 2014-11-01 DIAGNOSIS — Z01812 Encounter for preprocedural laboratory examination: Secondary | ICD-10-CM | POA: Insufficient documentation

## 2014-11-01 DIAGNOSIS — I651 Occlusion and stenosis of basilar artery: Secondary | ICD-10-CM | POA: Insufficient documentation

## 2014-11-01 HISTORY — DX: Presence of spectacles and contact lenses: Z97.3

## 2014-11-01 HISTORY — DX: Irritable bowel syndrome, unspecified: K58.9

## 2014-11-01 HISTORY — DX: Presence of cardiac pacemaker: Z95.0

## 2014-11-01 LAB — CBC
HEMATOCRIT: 33 % — AB (ref 36.0–46.0)
HEMOGLOBIN: 11 g/dL — AB (ref 12.0–15.0)
MCH: 27.8 pg (ref 26.0–34.0)
MCHC: 33.3 g/dL (ref 30.0–36.0)
MCV: 83.5 fL (ref 78.0–100.0)
Platelets: 166 10*3/uL (ref 150–400)
RBC: 3.95 MIL/uL (ref 3.87–5.11)
RDW: 14.3 % (ref 11.5–15.5)
WBC: 5.4 10*3/uL (ref 4.0–10.5)

## 2014-11-01 LAB — BASIC METABOLIC PANEL
Anion gap: 8 (ref 5–15)
BUN: 11 mg/dL (ref 6–20)
CO2: 27 mmol/L (ref 22–32)
CREATININE: 0.93 mg/dL (ref 0.44–1.00)
Calcium: 9.1 mg/dL (ref 8.9–10.3)
Chloride: 99 mmol/L — ABNORMAL LOW (ref 101–111)
GFR calc Af Amer: >60 mL/min (ref >60)
GFR calc non Af Amer: 54 mL/min — ABNORMAL LOW (ref >60)
Glucose, Bld: 89 mg/dL (ref 65–99)
Potassium: 3.8 mmol/L (ref 3.5–5.1)
SODIUM: 134 mmol/L — AB (ref 135–145)

## 2014-11-01 NOTE — Pre-Procedure Instructions (Signed)
Jade Juarez  11/01/2014      [facility] [address] - [place], [place] AT [address] [place] [address] Phone: [phone] Fax: [phone]    Your procedure is scheduled on Monday. November 05, 2014  Report to [facility] Admitting at 6:30 A.M.  Call this number if you have problems the morning of surgery:  [phone]   Remember:  Do not eat food or drink liquids after midnight Sunday, November 04, 2014  Take these medicines the morning of surgery with A SIP OF WATER :aspirin, clopidogrel (PLAVIX),   diltiazem (CARDIZEM ), levothyroxine (SYNTHROID, LEVOTHROID), sertraline (ZOLOFT)   Stop taking vitamins and herbal medications. Do not take any NSAIDs ie: Ibuprofen, Advil, Naproxen BC's Goody's and etc.;stop now.  Do not wear jewelry, make-up or nail polish.  Do not wear lotions, powders, or perfumes.  You may not wear deodorant.  Do not shave 48 hours prior to surgery.    Do not bring valuables to the hospital.  [facility] is not responsible for any belongings or valuables.  Contacts, dentures or bridgework may not be worn into surgery.  Leave your suitcase in the car.  After surgery it may be brought to your room.  For patients admitted to the hospital, discharge time will be determined by your treatment team.  Patients discharged the day of surgery will not be allowed to drive home.   Name and phone number of your driver:   Special instructions: Special Instructions:Special Instructions: [facility] - Preparing for Surgery  Before surgery, you can play an important role.  Because skin is not sterile, your skin needs to be as free of germs as possible.  You can reduce the number of germs on you skin by washing with CHG (chlorahexidine gluconate) soap before surgery.  CHG is an antiseptic cleaner which kills germs and bonds with the skin to continue killing germs even after washing.  Please DO NOT use if  you have an allergy to CHG or antibacterial soaps.  If your skin becomes reddened/irritated stop using the CHG and inform your nurse when you arrive at Short Stay.  Do not shave (including legs and underarms) for at least 48 hours prior to the first CHG shower.  You may shave your face.  Please follow these instructions carefully:   1.  Shower with CHG Soap the night before surgery and the morning of Surgery.  2.  If you choose to wash your hair, wash your hair first as usual with your normal shampoo.  3.  After you shampoo, rinse your hair and body thoroughly to remove the Shampoo.  4.  Use CHG as you would any other liquid soap.  You can apply chg directly  to the skin and wash gently with scrungie or a clean washcloth.  5.  Apply the CHG Soap to your body ONLY FROM THE NECK DOWN.  Do not use on open wounds or open sores.  Avoid contact with your eyes, ears, mouth and genitals (private parts).  Wash genitals (private parts) with your normal soap.  6.  Wash thoroughly, paying special attention to the area where your surgery will be performed.  7.  Thoroughly rinse your body with warm water from the neck down.  8.  DO NOT shower/wash with your normal soap after using and rinsing off the CHG Soap.  9.  Pat yourself dry with a  clean towel.            10.  Wear clean pajamas.            11.  Place clean sheets on your bed the night of your first shower and do not sleep with pets.  Day of Surgery  Do not apply any lotions/deodorants the morning of surgery.  Please wear clean clothes to the hospital/surgery center.  Please read over the following fact sheets that you were given. Pain Booklet, Coughing and Deep Breathing and Surgical Site Infection Prevention

## 2014-11-01 NOTE — Progress Notes (Signed)
Pt denies SOB and chest pain. Pt under the care of Dr. Elonda Husky of [facility] Cardiology and Dr. Adella Hare, cardiology. Pt stated that a stress test and echo was performed; records requested in addition to request for ICD peri-op prescription to be completed and re-faxed.

## 2014-11-02 ENCOUNTER — Other Ambulatory Visit: Payer: Self-pay | Admitting: Radiology

## 2014-11-02 NOTE — Progress Notes (Signed)
Anesthesia Chart Review: See previous anesthesia notes by Kabbe, FNP-BC from 07/12/14 and 08/14/14. She is s/p 4 vessel cerebral arteriogram on 08/15/14 with findings: 1.Severe string sign 95 % plus stenosis of mid basilar artery. 2.50% stenosis of RT VA origoin. 3.Approx 50 % stenosis of sup division of LT MCA At origin   Patient is now posted for angioplasty with possible stenting by Dr. Estanislado Pandy on 11/05/14. DX: Vertebrobasilar artery stenosis. She is on Plavix and ASA. Surgeon orders were pending at PAT. IR PA was notified yesterday morning.  Since her last procedure, she had explantation of her loop recorder and implantation of a Medtronic PPM (10/09/14) for SSS with 3 second pause after conversion from afib on her loop recorder. She had normal PPM function on interrogation 10/22/14.  Cardiologist is Dr. Elonda Husky with [facility]. He is aware of plans for procedure. She may ultimately be switched to Eliquis, but he is suggesting on waiting until after Dr. Estanislado Pandy has completed his procedure and gives his recommendations.   08/15/14 EKG: SR with occasional PVC and PACs, right BBB.  10/19/14 Echo: Limited echocardiogram/post-pacemaker check. LV cavity is normal in size. Concentric hypertrophy of the LV. Normal global wall motion. Calculated EF 73%. Pacing wire in RV/RA. No pericardial effusion.  10/09/14 CXR (Care Everywhere): IMPRESSION: Dual lead cardiac pacemaker with left subclavian approach with leads in right atrium and right ventricle. No acute infiltrate or pulmonary edema. No pneumothorax.  Preoperative labs noted. Surgeon orders were pending at that time of her PAT visit. Typically, IR orders a p2y12 and PT/PTT prior to these procedure, so I would anticipate that these will still need to be drawn on the day of surgery per IR orders.   From an anesthesia standpoint I think she will be able to proceed if no new changes.  [facility] Short Stay  Center/Anesthesiology Phone [phone] 11/02/2014 12:24 PM

## 2014-11-05 ENCOUNTER — Ambulatory Visit (HOSPITAL_COMMUNITY): Payer: Medicare HMO | Admitting: Anesthesiology

## 2014-11-05 ENCOUNTER — Ambulatory Visit (HOSPITAL_COMMUNITY): Payer: Medicare HMO | Admitting: Vascular Surgery

## 2014-11-05 ENCOUNTER — Encounter (HOSPITAL_COMMUNITY)
Admission: RE | Disposition: A | Discharge status: Home / Self Care | Payer: Self-pay | Source: Ambulatory Visit | Attending: Interventional Radiology

## 2014-11-05 ENCOUNTER — Encounter (HOSPITAL_COMMUNITY): Payer: Self-pay | Admitting: Certified Registered Nurse Anesthetist

## 2014-11-05 ENCOUNTER — Ambulatory Visit (HOSPITAL_COMMUNITY)
Admission: RE | Admit: 2014-11-05 | Discharge: 2014-11-05 | Disposition: A | Discharge status: Home / Self Care | Payer: Medicare HMO | Source: Ambulatory Visit | Attending: Interventional Radiology | Admitting: Interventional Radiology

## 2014-11-05 DIAGNOSIS — Z538 Procedure and treatment not carried out for other reasons: Secondary | ICD-10-CM | POA: Insufficient documentation

## 2014-11-05 DIAGNOSIS — I6509 Occlusion and stenosis of unspecified vertebral artery: Secondary | ICD-10-CM | POA: Diagnosis not present

## 2014-11-05 HISTORY — PX: RADIOLOGY WITH ANESTHESIA: SHX6223

## 2014-11-05 LAB — PROTIME-INR
INR: 1.03 (ref 0.00–1.49)
PROTHROMBIN TIME: 13.7 s (ref 11.6–15.2)

## 2014-11-05 LAB — PLATELET INHIBITION P2Y12: Platelet Function  P2Y12: 227 [PRU] (ref 194–418)

## 2014-11-05 SURGERY — RADIOLOGY WITH ANESTHESIA
Anesthesia: General

## 2014-11-05 MED ORDER — SODIUM CHLORIDE 0.9 % IV SOLN: INTRAVENOUS | Status: DC

## 2014-11-05 MED ORDER — CEFAZOLIN SODIUM-DEXTROSE 2-3 GM-% IV SOLR
2.0000 g | Freq: Once | INTRAVENOUS | Status: DC
  Filled 2014-11-05: qty 50

## 2014-11-05 MED ORDER — NIMODIPINE 30 MG PO CAPS
0.0000 mg | ORAL_CAPSULE | ORAL | Status: DC
  Filled 2014-11-05: qty 2

## 2014-11-05 MED ORDER — CLOPIDOGREL BISULFATE 75 MG PO TABS
75.0000 mg | ORAL_TABLET | ORAL | Status: DC
  Filled 2014-11-05: qty 1

## 2014-11-05 MED ORDER — ASPIRIN EC 325 MG PO TBEC
325.0000 mg | DELAYED_RELEASE_TABLET | ORAL | Status: DC
  Filled 2014-11-05: qty 1

## 2014-11-05 NOTE — H&P (Signed)
Chief Complaint: "I'm here for an angioplasty"  HPI: Jade Juarez is an 81 y.o. female with basilar stenosis and prior hx of CVA. She has had cerebral angiogram and is scheduled today for angioplasty/stent of this basilar stenosis. Pt feels ok, no new sxs or spells of TIA/CVA. Has been NPO this am. Taking her ASA and Plavix as directed. Family at bedside today  Past Medical History:  Past Medical History  Diagnosis Date  . Atrial fibrillation     prescribed Eliquis but hasn't started-waiting until after this procedure per [place]  . High cholesterol     takes Atorvastatin daily  . Anxiety   . Frequent UTI   . Heart murmur   . Restless leg syndrome   . Stool incontinence     at times  . Status post placement of implantable loop recorder 05/2014  . PONV (postoperative nausea and vomiting)   . Hypertension     takes Diltiazem and Diovan daily  . Hypothyroidism     takes Synthroid daily  . Depression     takes Zoloft daily  . Anemia     takes Ferrous Sulfate daily  . History of bronchitis     about a yr ago  . Headache     occasionally  . Stroke     takes Plavix daily-right sided weakness  . Arthritis     right knees  . Joint pain   . Joint swelling   . GERD (gastroesophageal reflux disease)     hx of-yrs ago  . History of colon polyps   . Diverticulitis     hx of  . Overactive bladder   . Incontinence   . Nocturia   . Presence of permanent cardiac pacemaker   . IBS (irritable bowel syndrome)     PMH  . Wears glasses     Past Surgical History:  Past Surgical History  Procedure Laterality Date  . Eye surgery Bilateral     cataract surgery  . Abdominal hysterectomy    . Bladder tack      x 2  . Colonoscopy    . Breast surgery Right     cyst removed  . Loop recorder implant  05/23/2014    Medtronic Reveal loop recorder (Dr. Lisa Roca, [facility])  . Radiology with anesthesia N/A 08/15/2014    Procedure: RADIOLOGY WITH ANESTHESIA;  Surgeon: Luanne Bras, MD;   Location: [facility];  Service: Radiology;  Laterality: N/A;  . Insert / replace / remove pacemaker      Family History:  Family History  Problem Relation Age of Onset  . Hypertension Mother   . Heart attack Mother   . Cancer Father     Social History:  reports that she has never smoked. She has never used smokeless tobacco. She reports that she does not drink alcohol or use illicit drugs.  Allergies:  Allergies  Allergen Reactions  . Hibiclens [Chlorhexidine Gluconate]     vomiting  . Benicar [Olmesartan] Other (See Comments)    Headache  . Codeine Nausea Only  . Erythromycin Nausea Only  . Lipitor [Atorvastatin] Other (See Comments)    Myalgia  . Other Nausea Only and Other (See Comments)    Entex ER OTC Cough Medicines - Causes confusion / disorientation   . Tetracyclines & Related Nausea Only  . Vibramycin [Doxycycline] Nausea Only  . Vicodin [Hydrocodone-Acetaminophen] Nausea Only    Medications:   Medication List    Notice    Cannot display patient  medications because the patient has not yet been checked in.      Please HPI for pertinent positives, otherwise complete 10 system ROS negative.  Physical Exam: There were no vitals taken for this visit. There is no weight on file to calculate BMI.   General Appearance:  Alert, cooperative, no distress, appears stated age  Head:  Normocephalic, without obvious abnormality, atraumatic  ENT: Unremarkable  Neck: Supple, symmetrical, trachea midline  Lungs:   Clear to auscultation bilaterally, no w/r/r, respirations unlabored without use of accessory muscles.  Chest Wall:  Palpable (L)pacemaker. Incision well healed  Heart:  Regular rate and rhythm, S1, S2 normal, no murmur, rub or gallop.  Abdomen:   Soft, non-tender, non distended.  Extremities: Extremities normal, atraumatic, no cyanosis or edema  Pulses: 2+ and symmetric  Neurologic: Normal affect, no gross deficits.  Labs: Results for orders placed or  performed during the hospital encounter of 11/05/14 (from the past 48 hour(s))  Platelet inhibition p2y12     Status: None   Collection Time: 11/05/14  7:21 AM  Result Value Ref Range   Platelet Function  P2Y12 227 194 - 418 PRU    Comment:        The literature has shown a direct correlation of PRU values over 230 with higher risks of thrombotic events.  Lower PRU values are associated with platelet inhibition.   Protime-INR     Status: None   Collection Time: 11/05/14  7:21 AM  Result Value Ref Range   Prothrombin Time 13.7 11.6 - 15.2 seconds   INR 1.03 0.00 - 1.49    Imaging: No results found.  Assessment/Plan Severe basilar artery stenosis For angiogram and possible intervention by angioplasty/stent Dr. Estanislado Pandy to review labs, as P2Y12 227. May need dose adjustment and reschedule vs additional loading dose today. Procedure reviewed, risks, complications, and plan for overnight observation. Consent signed in chart  Ascencion Dike PA-C 11/05/2014, 8:07 AM

## 2014-11-05 NOTE — Anesthesia Preprocedure Evaluation (Addendum)
Anesthesia Evaluation  Patient identified by MRN, date of birth, ID band Patient awake    Reviewed: Allergy & Precautions, NPO status , Patient's Chart, lab work & pertinent test results  History of Anesthesia Complications (+) PONV and history of anesthetic complications  Airway Mallampati: II  TM Distance: >3 FB Neck ROM: full    Dental  (+) Dental Advisory Given, Partial Upper   Pulmonary  breath sounds clear to auscultation        Cardiovascular hypertension, + Peripheral Vascular Disease + dysrhythmias Atrial Fibrillation + pacemaker Rhythm:irregular Rate:Normal     Neuro/Psych  Headaches, Anxiety Depression  Neuromuscular disease CVA, Residual Symptoms    GI/Hepatic GERD-  Medicated,  Endo/Other  Hypothyroidism obese  Renal/GU      Musculoskeletal  (+) Arthritis -,   Abdominal   Peds  Hematology   Anesthesia Other Findings   Reproductive/Obstetrics                            Anesthesia Physical Anesthesia Plan  ASA: III  Anesthesia Plan: General   Post-op Pain Management:    Induction: Intravenous  Airway Management Planned: Oral ETT  Additional Equipment: Arterial line  Intra-op Plan:   Post-operative Plan: Extubation in OR and Possible Post-op intubation/ventilation  Informed Consent: I have reviewed the patients History and Physical, chart, labs and discussed the procedure including the risks, benefits and alternatives for the proposed anesthesia with the patient or authorized representative who has indicated his/her understanding and acceptance.     Plan Discussed with: CRNA, Anesthesiologist and Surgeon  Anesthesia Plan Comments:         Anesthesia Quick Evaluation

## 2014-11-06 ENCOUNTER — Encounter (HOSPITAL_COMMUNITY): Payer: Self-pay | Admitting: Interventional Radiology

## 2014-11-06 ENCOUNTER — Telehealth (HOSPITAL_COMMUNITY): Payer: Self-pay | Admitting: Interventional Radiology

## 2014-11-06 NOTE — Telephone Encounter (Signed)
Pt's daughter called stating that her [employer] company requires an authorization for the [place] that Dr. Estanislado Pandy prescribed for her. I told her I would call the insurance company to try and get authorization and call her back this afternoon before I leave for the day. She was in agreement with this. JM

## 2014-11-07 ENCOUNTER — Telehealth (HOSPITAL_COMMUNITY): Payer: Self-pay | Admitting: Interventional Radiology

## 2014-11-12 ENCOUNTER — Other Ambulatory Visit: Payer: Self-pay | Admitting: Radiology

## 2014-11-12 LAB — PLATELET INHIBITION P2Y12: PLATELET FUNCTION P2Y12: 6 [PRU] — AB (ref 194–418)

## 2014-11-13 ENCOUNTER — Other Ambulatory Visit: Payer: Self-pay | Admitting: Radiology

## 2014-11-13 ENCOUNTER — Encounter (HOSPITAL_COMMUNITY): Payer: Self-pay | Admitting: *Deleted

## 2014-11-13 NOTE — Progress Notes (Signed)
Medtr[employer] rep paged. ?

## 2014-11-13 NOTE — Progress Notes (Signed)
Called pharmacy tech to put in [place] into the Med list. Pt started it on 11/06/14 taking 90 mg BID thru 11/11/14. On 11/12/14 she 90 mg one time, today took 45 mg this AM, she is to take 45 mg this PM and 45 mg tomorrow AM. This is per pt's daughter Taylor Spilde.

## 2014-11-13 NOTE — Progress Notes (Signed)
Jade Juarez, [employer] rep returned page. She states to call her in the AM if she is needed.

## 2014-11-14 ENCOUNTER — Encounter (HOSPITAL_COMMUNITY): Payer: Self-pay | Admitting: Surgery

## 2014-11-14 ENCOUNTER — Encounter (HOSPITAL_COMMUNITY)
Admission: AD | Disposition: A | Discharge status: Home / Self Care | Payer: Self-pay | Source: Ambulatory Visit | Attending: Interventional Radiology

## 2014-11-14 ENCOUNTER — Inpatient Hospital Stay (HOSPITAL_COMMUNITY)
Admission: AD | Admit: 2014-11-14 | Discharge: 2014-11-15 | DRG: 027 | Disposition: A | Discharge status: Home / Self Care | Payer: Medicare HMO | Source: Ambulatory Visit | Attending: Interventional Radiology | Admitting: Interventional Radiology

## 2014-11-14 ENCOUNTER — Encounter (HOSPITAL_COMMUNITY): Payer: Self-pay

## 2014-11-14 ENCOUNTER — Ambulatory Visit (HOSPITAL_COMMUNITY)
Admission: RE | Admit: 2014-11-14 | Discharge: 2014-11-14 | Disposition: A | Discharge status: Home / Self Care | Payer: Medicare HMO | Source: Ambulatory Visit | Attending: Interventional Radiology | Admitting: Interventional Radiology

## 2014-11-14 ENCOUNTER — Ambulatory Visit (HOSPITAL_COMMUNITY): Payer: Medicare HMO | Admitting: Certified Registered"

## 2014-11-14 DIAGNOSIS — Z7982 Long term (current) use of aspirin: Secondary | ICD-10-CM

## 2014-11-14 DIAGNOSIS — I10 Essential (primary) hypertension: Secondary | ICD-10-CM | POA: Diagnosis present

## 2014-11-14 DIAGNOSIS — Z950 Presence of cardiac pacemaker: Secondary | ICD-10-CM

## 2014-11-14 DIAGNOSIS — Z7902 Long term (current) use of antithrombotics/antiplatelets: Secondary | ICD-10-CM

## 2014-11-14 DIAGNOSIS — I651 Occlusion and stenosis of basilar artery: Secondary | ICD-10-CM | POA: Diagnosis present

## 2014-11-14 DIAGNOSIS — E039 Hypothyroidism, unspecified: Secondary | ICD-10-CM | POA: Diagnosis present

## 2014-11-14 DIAGNOSIS — Z79899 Other long term (current) drug therapy: Secondary | ICD-10-CM | POA: Diagnosis not present

## 2014-11-14 DIAGNOSIS — Z8673 Personal history of transient ischemic attack (TIA), and cerebral infarction without residual deficits: Secondary | ICD-10-CM | POA: Diagnosis not present

## 2014-11-14 DIAGNOSIS — F329 Major depressive disorder, single episode, unspecified: Secondary | ICD-10-CM | POA: Diagnosis present

## 2014-11-14 HISTORY — PX: RADIOLOGY WITH ANESTHESIA: SHX6223

## 2014-11-14 LAB — CBC WITH DIFFERENTIAL/PLATELET
BASOS ABS: 0 10*3/uL (ref 0.0–0.1)
Basophils Relative: 0 % (ref 0–1)
EOS PCT: 5 % (ref 0–5)
Eosinophils Absolute: 0.3 10*3/uL (ref 0.0–0.7)
HEMATOCRIT: 33.2 % — AB (ref 36.0–46.0)
Hemoglobin: 11.2 g/dL — ABNORMAL LOW (ref 12.0–15.0)
Lymphocytes Relative: 26 % (ref 12–46)
Lymphs Abs: 1.5 10*3/uL (ref 0.7–4.0)
MCH: 28.6 pg (ref 26.0–34.0)
MCHC: 33.7 g/dL (ref 30.0–36.0)
MCV: 84.7 fL (ref 78.0–100.0)
MONO ABS: 0.5 10*3/uL (ref 0.1–1.0)
Monocytes Relative: 8 % (ref 3–12)
Neutro Abs: 3.7 10*3/uL (ref 1.7–7.7)
Neutrophils Relative %: 61 % (ref 43–77)
Platelets: 171 10*3/uL (ref 150–400)
RBC: 3.92 MIL/uL (ref 3.87–5.11)
RDW: 14.4 % (ref 11.5–15.5)
WBC: 5.9 10*3/uL (ref 4.0–10.5)

## 2014-11-14 LAB — COMPREHENSIVE METABOLIC PANEL
ALT: 19 U/L (ref 14–54)
AST: 24 U/L (ref 15–41)
Albumin: 3.6 g/dL (ref 3.5–5.0)
Alkaline Phosphatase: 74 U/L (ref 38–126)
Anion gap: 8 (ref 5–15)
BUN: 20 mg/dL (ref 6–20)
CO2: 27 mmol/L (ref 22–32)
Calcium: 9.2 mg/dL (ref 8.9–10.3)
Chloride: 100 mmol/L — ABNORMAL LOW (ref 101–111)
Creatinine, Ser: 1.08 mg/dL — ABNORMAL HIGH (ref 0.44–1.00)
GFR calc non Af Amer: 45 mL/min — ABNORMAL LOW (ref >60)
GFR, EST AFRICAN AMERICAN: 52 mL/min — AB (ref >60)
GLUCOSE: 89 mg/dL (ref 65–99)
Potassium: 3.6 mmol/L (ref 3.5–5.1)
SODIUM: 135 mmol/L (ref 135–145)
TOTAL PROTEIN: 6.4 g/dL — AB (ref 6.5–8.1)
Total Bilirubin: 0.7 mg/dL (ref 0.3–1.2)

## 2014-11-14 LAB — POCT ACTIVATED CLOTTING TIME
ACTIVATED CLOTTING TIME: 245 s
Activated Clotting Time: 220 seconds
Activated Clotting Time: 239 seconds
Activated Clotting Time: 183 seconds

## 2014-11-14 LAB — PROTIME-INR
INR: 1.1 (ref 0.00–1.49)
PROTHROMBIN TIME: 14.4 s (ref 11.6–15.2)

## 2014-11-14 LAB — PLATELET INHIBITION P2Y12: Platelet Function  P2Y12: 59 [PRU] — ABNORMAL LOW (ref 194–418)

## 2014-11-14 LAB — APTT: aPTT: 31 seconds (ref 24–37)

## 2014-11-14 LAB — HEPARIN LEVEL (UNFRACTIONATED): Heparin Unfractionated: 0.31 IU/mL (ref 0.30–0.70)

## 2014-11-14 LAB — MRSA PCR SCREENING: MRSA by PCR: NEGATIVE

## 2014-11-14 SURGERY — RADIOLOGY WITH ANESTHESIA
Anesthesia: General

## 2014-11-14 MED ORDER — NIMODIPINE 30 MG PO CAPS
ORAL_CAPSULE | ORAL | Status: AC
  Administered 2014-11-14: 60 mg via ORAL
  Filled 2014-11-14: qty 2

## 2014-11-14 MED ORDER — NIMODIPINE 30 MG PO CAPS
0.0000 mg | ORAL_CAPSULE | ORAL | Status: AC
  Administered 2014-11-14: 60 mg via ORAL
  Filled 2014-11-14: qty 2

## 2014-11-14 MED ORDER — ASPIRIN 81 MG PO CHEW
81.0000 mg | CHEWABLE_TABLET | Freq: Every day | ORAL | Status: DC
Start: 2014-11-15 — End: 2014-11-15
  Filled 2014-11-14 (×2): qty 1

## 2014-11-14 MED ORDER — ONDANSETRON HCL 4 MG/2ML IJ SOLN
4.0000 mg | Freq: Four times a day (QID) | INTRAMUSCULAR | Status: DC | PRN
  Administered 2014-11-14 (×2): 4 mg via INTRAVENOUS
  Filled 2014-11-14 (×2): qty 2

## 2014-11-14 MED ORDER — HEPARIN (PORCINE) IN NACL 100-0.45 UNIT/ML-% IJ SOLN
400.0000 [IU]/h | INTRAMUSCULAR | Status: DC
  Administered 2014-11-14 (×2): 500 [IU]/h via INTRAVENOUS
  Filled 2014-11-14: qty 250

## 2014-11-14 MED ORDER — HEPARIN SODIUM (PORCINE) 1000 UNIT/ML IJ SOLN
INTRAMUSCULAR | Status: DC | PRN
  Administered 2014-11-14: 3000 [IU] via INTRAVENOUS

## 2014-11-14 MED ORDER — NITROGLYCERIN 1 MG/10 ML FOR IR/CATH LAB
INTRA_ARTERIAL | Status: AC
  Administered 2014-11-14: 25 ug
  Filled 2014-11-14: qty 10

## 2014-11-14 MED ORDER — PROPOFOL 10 MG/ML IV BOLUS
INTRAVENOUS | Status: DC | PRN
Start: 2014-11-14 — End: 2014-11-14
  Administered 2014-11-14: 110 mg via INTRAVENOUS

## 2014-11-14 MED ORDER — PHENYLEPHRINE HCL 10 MG/ML IJ SOLN
INTRAMUSCULAR | Status: DC | PRN
  Administered 2014-11-14 (×2): 80 ug via INTRAVENOUS

## 2014-11-14 MED ORDER — ONDANSETRON HCL 4 MG/2ML IJ SOLN
INTRAMUSCULAR | Status: DC | PRN
  Administered 2014-11-14: 4 mg via INTRAVENOUS

## 2014-11-14 MED ORDER — NICARDIPINE HCL IN NACL 20-0.86 MG/200ML-% IV SOLN
5.0000 mg/h | INTRAVENOUS | Status: DC
  Administered 2014-11-14 (×4): 10 mg/h via INTRAVENOUS
  Administered 2014-11-14: 5 mg/h via INTRAVENOUS
  Filled 2014-11-14 (×5): qty 200

## 2014-11-14 MED ORDER — ROCURONIUM BROMIDE 100 MG/10ML IV SOLN
INTRAVENOUS | Status: DC | PRN
  Administered 2014-11-14: 30 mg via INTRAVENOUS
  Administered 2014-11-14 (×2): 10 mg via INTRAVENOUS

## 2014-11-14 MED ORDER — ACETAMINOPHEN 650 MG RE SUPP: 650.0000 mg | Freq: Four times a day (QID) | RECTAL | Status: DC | PRN

## 2014-11-14 MED ORDER — LIDOCAINE HCL (CARDIAC) 20 MG/ML IV SOLN
INTRAVENOUS | Status: DC | PRN
  Administered 2014-11-14: 60 mg via INTRAVENOUS

## 2014-11-14 MED ORDER — PROMETHAZINE HCL 25 MG/ML IJ SOLN: 6.2500 mg | INTRAMUSCULAR | Status: DC | PRN

## 2014-11-14 MED ORDER — CEFAZOLIN SODIUM-DEXTROSE 2-3 GM-% IV SOLR
INTRAVENOUS | Status: AC
  Filled 2014-11-14: qty 50

## 2014-11-14 MED ORDER — TICAGRELOR 90 MG PO TABS
45.0000 mg | ORAL_TABLET | Freq: Two times a day (BID) | ORAL | Status: DC
  Filled 2014-11-14 (×2): qty 1

## 2014-11-14 MED ORDER — GLYCOPYRROLATE 0.2 MG/ML IJ SOLN
INTRAMUSCULAR | Status: DC | PRN
  Administered 2014-11-14: 0.4 mg via INTRAVENOUS

## 2014-11-14 MED ORDER — DEXTROSE 5 % IV SOLN
10.0000 mg | INTRAVENOUS | Status: DC | PRN
  Administered 2014-11-14: 20 ug/min via INTRAVENOUS

## 2014-11-14 MED ORDER — NICARDIPINE HCL IN NACL 40-0.83 MG/200ML-% IV SOLN
5.0000 mg/h | INTRAVENOUS | Status: DC
  Administered 2014-11-14: 15 mg/h via INTRAVENOUS
  Filled 2014-11-14 (×2): qty 200

## 2014-11-14 MED ORDER — ACETAMINOPHEN 500 MG PO TABS
1000.0000 mg | ORAL_TABLET | Freq: Four times a day (QID) | ORAL | Status: DC | PRN
  Administered 2014-11-14: 1000 mg via ORAL
  Filled 2014-11-14: qty 2

## 2014-11-14 MED ORDER — NEOSTIGMINE METHYLSULFATE 10 MG/10ML IV SOLN
INTRAVENOUS | Status: DC | PRN
  Administered 2014-11-14: 3 mg via INTRAVENOUS

## 2014-11-14 MED ORDER — LACTATED RINGERS IV SOLN
INTRAVENOUS | Status: DC | PRN
  Administered 2014-11-14 (×2): via INTRAVENOUS

## 2014-11-14 MED ORDER — HEPARIN (PORCINE) IN NACL 100-0.45 UNIT/ML-% IJ SOLN
INTRAMUSCULAR | Status: AC
  Filled 2014-11-14: qty 250

## 2014-11-14 MED ORDER — FENTANYL CITRATE (PF) 250 MCG/5ML IJ SOLN
INTRAMUSCULAR | Status: DC | PRN
  Administered 2014-11-14: 100 ug via INTRAVENOUS

## 2014-11-14 MED ORDER — SODIUM CHLORIDE 0.9 % IV SOLN: INTRAVENOUS | Status: DC

## 2014-11-14 MED ORDER — SODIUM CHLORIDE 0.9 % IV SOLN
INTRAVENOUS | Status: DC
  Administered 2014-11-14 – 2014-11-15 (×2): via INTRAVENOUS

## 2014-11-14 MED ORDER — LIDOCAINE HCL 1 % IJ SOLN
INTRAMUSCULAR | Status: AC
  Filled 2014-11-14: qty 20

## 2014-11-14 MED ORDER — PROTAMINE SULFATE 10 MG/ML IV SOLN
INTRAVENOUS | Status: DC | PRN
  Administered 2014-11-14: 7.5 mg via INTRAVENOUS

## 2014-11-14 MED ORDER — FENTANYL CITRATE (PF) 100 MCG/2ML IJ SOLN: 25.0000 ug | INTRAMUSCULAR | Status: DC | PRN

## 2014-11-14 MED ORDER — CEFAZOLIN SODIUM-DEXTROSE 2-3 GM-% IV SOLR
2.0000 g | Freq: Once | INTRAVENOUS | Status: AC
  Administered 2014-11-14: 2 g via INTRAVENOUS

## 2014-11-14 MED ORDER — IOHEXOL 300 MG/ML  SOLN
150.0000 mL | Freq: Once | INTRAMUSCULAR | Status: DC | PRN
  Administered 2014-11-14: 80 mL via INTRAVENOUS
  Filled 2014-11-14: qty 150

## 2014-11-14 NOTE — Progress Notes (Signed)
Referring Physician(s): Dr Everette Rank  Chief Complaint:  Basilar artery stenosis  Subjective:  Basilar artery stenosis Angioplasty 11/14/14 with Dr Estanislado Pandy Pt has done well Resting Only mildly nauseated No vomiting Denies headache Denies visual or speech issues Heparin ongoing Cardene still in use  Allergies: Hibiclens; Benicar; Codeine; Erythromycin; Lipitor; Other; Tetracyclines & related; Vibramycin; and Vicodin  Medications: Prior to Admission medications   Medication Sig Start Date End Date Taking? Authorizing Provider  aspirin EC 325 MG tablet Take 325 mg by mouth daily.   Yes Historical Provider, MD  atorvastatin (LIPITOR) 10 MG tablet Take 10 mg by mouth at bedtime.    Yes Historical Provider, MD  calcium carbonate (OS-CAL) 600 MG TABS tablet Take 600 mg by mouth daily.   Yes Historical Provider, MD  Cholecalciferol (VITAMIN D3) 5000 UNITS CAPS Take 5,000 Units by mouth daily.   Yes Historical Provider, MD  clopidogrel (PLAVIX) 75 MG tablet Take 150 mg by mouth daily.    Yes Historical Provider, MD  conjugated estrogens (PREMARIN) vaginal cream Place 1 Applicatorful vaginally every other day.   Yes Historical Provider, MD  diltiazem (CARDIZEM CD) 120 MG 24 hr capsule Take 120 mg by mouth 2 (two) times daily.    Yes Historical Provider, MD  ferrous sulfate 325 (65 FE) MG tablet Take 325 mg by mouth 2 (two) times a week. Takes on Monday & Wedensday   Yes Historical Provider, MD  gabapentin (NEURONTIN) 400 MG capsule Take 400 mg by mouth at bedtime.    Yes Historical Provider, MD  levothyroxine (SYNTHROID, LEVOTHROID) 88 MCG tablet Take 88 mcg by mouth daily before breakfast.   Yes Historical Provider, MD  mirabegron ER (MYRBETRIQ) 50 MG TB24 tablet Take 50 mg by mouth daily.   Yes Historical Provider, MD  sertraline (ZOLOFT) 50 MG tablet Take 50 mg by mouth daily.   Yes Historical Provider, MD  ticagrelor (BRILINTA) 90 MG TABS tablet Take 45-90 mg by mouth See admin  instructions. 11/06/14-11/11/14=90 mg twice a day 11/12/14=90 mg daily 11/13/14=45 mg twice a day 11/14/14=45 mg in the morning... Unknowing dosing thereafter   Yes Historical Provider, MD  valsartan-hydrochlorothiazide (DIOVAN-HCT) 160-12.5 MG per tablet Take 1 tablet by mouth daily.   Yes Historical Provider, MD  amoxicillin (AMOXIL) 500 MG capsule Take 2,000 mg by mouth See admin instructions. 1 hour before dental procedure 06/17/14   Historical Provider, MD  ciprofloxacin (CIPRO) 500 MG tablet Take 1,000 mg by mouth daily as needed (chronic dental appt).  10/11/14   Historical Provider, MD  mupirocin ointment (BACTROBAN) 2 % Place 1 application into the nose 2 (two) times daily. 10/04/14   Historical Provider, MD     Vital Signs: BP 103/49 mmHg  Pulse 70  Temp(Src) 97.5 F (36.4 C) (Oral)  Resp 17  Ht 5\' 2"  (1.575 m)  Wt 168 lb (76.204 kg)  BMI 30.72 kg/m2  SpO2 94%  Physical Exam  Constitutional: She is oriented to person, place, and time. She appears well-nourished.  HENT:  Face symmetrical Smile = Puffs cheeks =   Eyes: EOM are normal.  Neck: Normal range of motion.  Cardiovascular: Normal rate and regular rhythm.   Pulmonary/Chest: Effort normal and breath sounds normal.  Abdominal: Soft.  Musculoskeletal: Normal range of motion.  Rt groin Bandaged NT No bleeding No hematoma Rt foot 2+ pulses  Grip is strong B Minimally weaker on RT   Neurological: She is alert and oriented to person, place, and time.  Skin:  Skin is warm and dry.  Psychiatric: She has a normal mood and affect. Her behavior is normal. Judgment and thought content normal.  Nursing note and vitals reviewed.   Imaging: No results found.  Labs:  CBC:  Recent Labs  07/11/14 1542 08/09/14 1517 11/01/14 1606 11/14/14 0744  WBC 5.6 6.2 5.4 5.9  HGB 12.1 12.7 11.0* 11.2*  HCT 35.7* 37.2 33.0* 33.2*  PLT 168 183 166 171    COAGS:  Recent Labs  07/11/14 1542 11/05/14 0721 11/14/14 0744  INR  1.05 1.03 1.10  APTT 33  --  31    BMP:  Recent Labs  07/11/14 1542 08/09/14 1517 11/01/14 1606 11/14/14 0744  NA 136 135 134* 135  K 4.1 4.1 3.8 3.6  CL 99 100* 99* 100*  CO2 30 26 27 27   GLUCOSE 88 100* 89 89  BUN 8 11 11 20   CALCIUM 9.3 9.4 9.1 9.2  CREATININE 0.97 0.89 0.93 1.08*  GFRNONAA 52* 57* 54* 45*  GFRAA 60* >60 >60 52*    LIVER FUNCTION TESTS:  Recent Labs  07/11/14 1542 11/14/14 0744  BILITOT 0.5 0.7  AST 23 24  ALT 15 19  ALKPHOS 79 74  PROT 6.6 6.4*  ALBUMIN 3.6 3.6    Assessment and Plan:  Basilar artery pta Doing well Dr Estanislado Pandy has seen and examined pt Admit overnight Plan for dc in am  Signed: Oliviagrace Crisanti A 11/14/2014, 3:26 PM   I spent a total of 15 Minutes in face to face in clinical consultation/evaluation, greater than 50% of which was counseling/coordinating care for basilar artery pta

## 2014-11-14 NOTE — H&P (Signed)
HPI: Patient with severe basilar artery stenosis scheduled today for cerebral angiogram and possible intervention by angioplasty/stent. She denies any new neurological symptoms and states she has been taking her medications as directed.   The patient has had a H&P performed within the last 30 days, all history, medications, and exam have been reviewed. The patient denies any interval changes since the H&P.  Medications: Prior to Admission medications   Medication Sig Start Date End Date Taking? Authorizing Provider  amoxicillin (AMOXIL) 500 MG capsule Take 2,000 mg by mouth See admin instructions. 1 hour before dental procedure 06/17/14  Yes Historical Provider, MD  aspirin EC 325 MG tablet Take 325 mg by mouth daily.   Yes Historical Provider, MD  atorvastatin (LIPITOR) 10 MG tablet Take 10 mg by mouth at bedtime.    Yes Historical Provider, MD  calcium carbonate (OS-CAL) 600 MG TABS tablet Take 600 mg by mouth daily.   Yes Historical Provider, MD  Cholecalciferol (VITAMIN D3) 5000 UNITS CAPS Take 5,000 Units by mouth daily.   Yes Historical Provider, MD  ciprofloxacin (CIPRO) 500 MG tablet Take 1,000 mg by mouth daily as needed (chronic dental appt).  10/11/14  Yes Historical Provider, MD  clopidogrel (PLAVIX) 75 MG tablet Take 150 mg by mouth daily.    Yes Historical Provider, MD  conjugated estrogens (PREMARIN) vaginal cream Place 1 Applicatorful vaginally every other day.   Yes Historical Provider, MD  diltiazem (CARDIZEM CD) 120 MG 24 hr capsule Take 120 mg by mouth 2 (two) times daily.    Yes Historical Provider, MD  ferrous sulfate 325 (65 FE) MG tablet Take 325 mg by mouth 2 (two) times a week. Takes on Monday & Wedensday   Yes Historical Provider, MD  gabapentin (NEURONTIN) 400 MG capsule Take 400 mg by mouth at bedtime.    Yes Historical Provider, MD  levothyroxine (SYNTHROID, LEVOTHROID) 88 MCG tablet Take 88 mcg by mouth daily before breakfast.   Yes Historical Provider, MD    mirabegron ER (MYRBETRIQ) 50 MG TB24 tablet Take 50 mg by mouth daily.   Yes Historical Provider, MD  mupirocin ointment (BACTROBAN) 2 % Place 1 application into the nose 2 (two) times daily. 10/04/14  Yes Historical Provider, MD  sertraline (ZOLOFT) 50 MG tablet Take 50 mg by mouth daily.   Yes Historical Provider, MD  valsartan-hydrochlorothiazide (DIOVAN-HCT) 160-12.5 MG per tablet Take 1 tablet by mouth daily.   Yes Historical Provider, MD  ticagrelor (BRILINTA) 90 MG TABS tablet Take 45-90 mg by mouth See admin instructions. 11/06/14-11/11/14=90 mg twice a day 11/12/14=90 mg daily 11/13/14=45 mg twice a day 11/14/14=45 mg in the morning... Unknowing dosing thereafter    Historical Provider, MD    Vital Signs: T: 97.5 F, HR: 74 bpm, BP: 159/80 mmHg  Physical Exam  Constitutional: She is oriented to person, place, and time. No distress.  HENT:  Head: Normocephalic and atraumatic.  Neck: No tracheal deviation present.  Cardiovascular: Normal rate, regular rhythm and intact distal pulses.  Exam reveals no gallop and no friction rub.   No murmur heard. Pulmonary/Chest: Effort normal and breath sounds normal. No respiratory distress. She has no wheezes. She has no rales.  Abdominal: Soft. Bowel sounds are normal.  Neurological: She is alert and oriented to person, place, and time.  Speech clear, smile symmetrical, equal strength upper and lower extremities 5/5  Skin: Skin is warm and dry. She is not diaphoretic.  Psychiatric: She has a normal mood and affect. Her  behavior is normal. Thought content normal.    Mallampati Score:  MD Evaluation Airway: WNL Heart: WNL Abdomen: WNL Chest/ Lungs: WNL ASA  Classification: 3 Mallampati/Airway Score: One  Labs:  CBC:  Recent Labs  07/11/14 1542 08/09/14 1517 11/01/14 1606 11/14/14 0744  WBC 5.6 6.2 5.4 5.9  HGB 12.1 12.7 11.0* 11.2*  HCT 35.7* 37.2 33.0* 33.2*  PLT 168 183 166 171    COAGS:  Recent Labs  07/11/14 1542  11/05/14 0721 11/14/14 0744  INR 1.05 1.03 1.10  APTT 33  --  31    BMP:  Recent Labs  07/11/14 1542 08/09/14 1517 11/01/14 1606 11/14/14 0744  NA 136 135 134* 135  K 4.1 4.1 3.8 3.6  CL 99 100* 99* 100*  CO2 30 26 27 27   GLUCOSE 88 100* 89 89  BUN 8 11 11 20   CALCIUM 9.3 9.4 9.1 9.2  CREATININE 0.97 0.89 0.93 1.08*  GFRNONAA 52* 57* 54* 45*  GFRAA 60* >60 >60 52*    LIVER FUNCTION TESTS:  Recent Labs  07/11/14 1542 11/14/14 0744  BILITOT 0.5 0.7  AST 23 24  ALT 15 19  ALKPHOS 79 74  PROT 6.6 6.4*  ALBUMIN 3.6 3.6    Assessment/Plan:  Severe basilar artery stenosis For angiogram and possible intervention by angioplasty/stent Dr. Estanislado Pandy reviewed labs, as P2Y12 59 The patient has been NPO, no blood thinners taken, labs and vitals have been reviewed. Risks and Benefits discussed with the patient including, but not limited to bleeding, infection, vascular injury, contrast induced renal failure or stroke. All of the patient's questions were answered, patient is agreeable to proceed. Consent signed and in chart.   SignedHedy Jacob 11/14/2014, 8:54 AM

## 2014-11-14 NOTE — Anesthesia Procedure Notes (Signed)
Procedure Name: Intubation Date/Time: 11/14/2014 9:39 AM Performed by: Barrington Ellison Pre-anesthesia Checklist: Patient identified, Emergency Drugs available, Suction available, Patient being monitored and Timeout performed Patient Re-evaluated:Patient Re-evaluated prior to inductionOxygen Delivery Method: Circle system utilized Preoxygenation: Pre-oxygenation with 100% oxygen Intubation Type: IV induction Ventilation: Mask ventilation without difficulty and Oral airway inserted - appropriate to patient size Laryngoscope Size: Mac and 3 Grade View: Grade I Tube type: Oral Tube size: 7.0 mm Number of attempts: 1 Airway Equipment and Method: Stylet Placement Confirmation: ETT inserted through vocal cords under direct vision,  positive ETCO2 and breath sounds checked- equal and bilateral Secured at: 21 cm Tube secured with: Tape Dental Injury: Teeth and Oropharynx as per pre-operative assessment

## 2014-11-14 NOTE — Progress Notes (Signed)
ANTICOAGULATION CONSULT NOTE - Initial Consult  Pharmacy Consult for heparin Indication: s/p cerebral angiogram  Allergies  Allergen Reactions  . Hibiclens [Chlorhexidine Gluconate]     vomiting  . Benicar [Olmesartan] Other (See Comments)    Headache  . Codeine Nausea Only  . Erythromycin Nausea Only  . Lipitor [Atorvastatin] Other (See Comments)    Myalgia  . Other Nausea Only and Other (See Comments)    Entex ER OTC Cough Medicines - Causes confusion / disorientation   . Tetracyclines & Related Nausea Only  . Vibramycin [Doxycycline] Nausea Only  . Vicodin [Hydrocodone-Acetaminophen] Nausea Only    Patient Measurements: Height: 5\' 2"  (157.5 cm) Weight: 168 lb (76.204 kg) IBW/kg (Calculated) : 50.1 Heparin Dosing Weight: 66.7kg  Vital Signs: Temp: 97.5 F (36.4 C) (08/10 1330) Temp Source: Oral (08/10 0716) BP: 103/49 mmHg (08/10 1345) Pulse Rate: 70 (08/10 1345)  Labs:  Recent Labs  11/14/14 0744  HGB 11.2*  HCT 33.2*  PLT 171  APTT 31  LABPROT 14.4  INR 1.10  CREATININE 1.08*    Estimated Creatinine Clearance: 35.7 mL/min (by C-G formula based on Cr of 1.08).  Medications:  Infusions:  . heparin 500 Units/hr (11/14/14 1239)  . niCARDipine 10 mg/hr (11/14/14 1300)    Assessment: 56 yof s/p cerebral angiogram to start heparin gtt post-op. H/H slightly low but platelets are WNL. Heparin already started at a dose of 8 units/kg/hr which is appropriate.   Goal of Therapy:  Heparin level 0.1-0.25 units/ml Monitor platelets by anticoagulation protocol: Yes   Plan:  - Continue heparin gtt at 500 units/hr - off at 0700 on 8/11 - Check an 8 hour heparin level - Daily HL and CBC x 1  Crescentia Boutwell, Rande Lawman 11/14/2014,1:57 PM

## 2014-11-14 NOTE — Progress Notes (Signed)
Right groin oozing bright red blood upon assessment.  Direct Pressure applied for 10 min then 10lb sandbag placed to area.  Pt instructed to keep right leg straight and HOB flat.  Pt verbalized understanding.  Old drainage marked on dressing.  Oozing has subsided, sandbag remains in place.  Pt denies pain at site.  Gus Rankin 11/14/2014 10:18 PM

## 2014-11-14 NOTE — Addendum Note (Signed)
Addendum  created 11/14/14 1449 by Barrington Ellison, CRNA   Modules edited: Anesthesia Attestations

## 2014-11-14 NOTE — Anesthesia Preprocedure Evaluation (Addendum)
Anesthesia Evaluation  Patient identified by MRN, date of birth, ID band Patient awake    Reviewed: Allergy & Precautions, NPO status , Patient's Chart, lab work & pertinent test results  History of Anesthesia Complications (+) PONV  Airway Mallampati: II  TM Distance: >3 FB Neck ROM: Full    Dental  (+) Dental Advisory Given, Partial Upper, Partial Lower   Pulmonary neg pulmonary ROS,  breath sounds clear to auscultation        Cardiovascular hypertension, Pt. on medications + Peripheral Vascular Disease + dysrhythmias (SSS) Atrial Fibrillation + pacemaker Rhythm:Regular Rate:Normal     Neuro/Psych Anxiety Depression CVA    GI/Hepatic Neg liver ROS, GERD-  ,  Endo/Other  Hypothyroidism   Renal/GU negative Renal ROS     Musculoskeletal  (+) Arthritis -,   Abdominal   Peds  Hematology  (+) anemia ,   Anesthesia Other Findings   Reproductive/Obstetrics                           Anesthesia Physical Anesthesia Plan  ASA: III  Anesthesia Plan: General   Post-op Pain Management:    Induction: Intravenous  Airway Management Planned: Oral ETT  Additional Equipment: Arterial line  Intra-op Plan:   Post-operative Plan: Extubation in OR and Possible Post-op intubation/ventilation  Informed Consent: I have reviewed the patients History and Physical, chart, labs and discussed the procedure including the risks, benefits and alternatives for the proposed anesthesia with the patient or authorized representative who has indicated his/her understanding and acceptance.   Dental advisory given  Plan Discussed with: CRNA  Anesthesia Plan Comments:         Anesthesia Quick Evaluation

## 2014-11-14 NOTE — Progress Notes (Signed)
Pre-procedure in IR 2: pedal pulses: DP 2+; PT 1+

## 2014-11-14 NOTE — Progress Notes (Signed)
ANTICOAGULATION CONSULT NOTE - Follow Up Consult  Pharmacy Consult for Heparin Indication: s/p cerebral angiogram  Allergies  Allergen Reactions  . Hibiclens [Chlorhexidine Gluconate]     vomiting  . Benicar [Olmesartan] Other (See Comments)    Headache  . Codeine Nausea Only  . Erythromycin Nausea Only  . Lipitor [Atorvastatin] Other (See Comments)    Myalgia  . Other Nausea Only and Other (See Comments)    Entex ER OTC Cough Medicines - Causes confusion / disorientation   . Tetracyclines & Related Nausea Only  . Vibramycin [Doxycycline] Nausea Only  . Vicodin [Hydrocodone-Acetaminophen] Nausea Only    Patient Measurements: Height: 5\' 2"  (157.5 cm) Weight: 163 lb 5.8 oz (74.1 kg) IBW/kg (Calculated) : 50.1 Heparin Dosing Weight: 66.7 kg  Vital Signs: Temp: 98.4 F (36.9 C) (08/10 2000) Temp Source: Axillary (08/10 2000) BP: 102/39 mmHg (08/10 2100) Pulse Rate: 70 (08/10 2100)  Labs:  Recent Labs  11/14/14 0744 11/14/14 2010  HGB 11.2*  --   HCT 33.2*  --   PLT 171  --   APTT 31  --   LABPROT 14.4  --   INR 1.10  --   HEPARINUNFRC  --  0.31  CREATININE 1.08*  --     Estimated Creatinine Clearance: 35.2 mL/min (by C-G formula based on Cr of 1.08).  Assessment:  Heparin level tonight is 0.31 on 500 units/hr.  Above low goal.  RN reports IV access lost for ~30 minutes prior to level sample, so steady-state level would have been higher.  No bleeding noted.  Goal of Therapy:  Heparin level 0.1-0.25 units/ml Monitor platelets by anticoagulation protocol: Yes   Plan:   Decrease heparin drip to 400 units/hr.  Drip to be off at 7am on 11/15/14.  Arty Baumgartner, [place] Pager: [phone] 11/14/2014,9:55 PM

## 2014-11-14 NOTE — Procedures (Signed)
S/P basilar artery angioplasty for symptomatic severe bid basilar artery stenosis with residual stenosis of approx 10 %

## 2014-11-14 NOTE — Anesthesia Postprocedure Evaluation (Signed)
  Anesthesia Post-op Note  Patient: Jade Juarez  Procedure(s) Performed: Procedure(s): RADIOLOGY WITH ANESTHESIA (N/A)  Patient Location: PACU  Anesthesia Type:General  Level of Consciousness: awake and alert   Airway and Oxygen Therapy: Patient Spontanous Breathing  Post-op Pain: none  Post-op Assessment: Post-op Vital signs reviewed   LLE Sensation: Full sensation, No numbness, No tingling RLE Motor Response: Purposeful movement, Responds to commands RLE Sensation: Full sensation, No numbness, No tingling      Post-op Vital Signs: Reviewed  Last Vitals:  Filed Vitals:   11/14/14 1345  BP: 103/49  Pulse: 70  Temp:   Resp: 17    Complications: No apparent anesthesia complications

## 2014-11-14 NOTE — Transfer of Care (Signed)
Immediate Anesthesia Transfer of Care Note  Patient: Jade Juarez  Procedure(s) Performed: Procedure(s): RADIOLOGY WITH ANESTHESIA (N/A)  Patient Location: PACU  Anesthesia Type:General  Level of Consciousness: awake and patient cooperative  Airway & Oxygen Therapy: Patient Spontanous Breathing and Patient connected to nasal cannula oxygen  Post-op Assessment: Report given to RN, Patient moving all extremities X 4 and Patient able to stick tongue midline  Post vital signs: Reviewed and stable  Last Vitals:  Filed Vitals:   11/14/14 0839  BP: 163/76  Pulse:   Temp:   Resp:     Complications: No apparent anesthesia complications

## 2014-11-15 ENCOUNTER — Encounter (HOSPITAL_COMMUNITY): Payer: Self-pay | Admitting: Interventional Radiology

## 2014-11-15 LAB — CBC WITH DIFFERENTIAL/PLATELET
Basophils Absolute: 0 10*3/uL (ref 0.0–0.1)
Basophils Relative: 0 % (ref 0–1)
EOS PCT: 1 % (ref 0–5)
Eosinophils Absolute: 0.1 10*3/uL (ref 0.0–0.7)
HCT: 26.1 % — ABNORMAL LOW (ref 36.0–46.0)
HEMOGLOBIN: 8.8 g/dL — AB (ref 12.0–15.0)
Lymphocytes Relative: 23 % (ref 12–46)
Lymphs Abs: 1.8 10*3/uL (ref 0.7–4.0)
MCH: 28.7 pg (ref 26.0–34.0)
MCHC: 33.7 g/dL (ref 30.0–36.0)
MCV: 85 fL (ref 78.0–100.0)
MONO ABS: 0.6 10*3/uL (ref 0.1–1.0)
Monocytes Relative: 8 % (ref 3–12)
Neutro Abs: 5.2 10*3/uL (ref 1.7–7.7)
Neutrophils Relative %: 68 % (ref 43–77)
Platelets: 145 10*3/uL — ABNORMAL LOW (ref 150–400)
RBC: 3.07 MIL/uL — ABNORMAL LOW (ref 3.87–5.11)
RDW: 14.6 % (ref 11.5–15.5)
WBC: 7.6 10*3/uL (ref 4.0–10.5)

## 2014-11-15 LAB — BASIC METABOLIC PANEL
Anion gap: 7 (ref 5–15)
BUN: 17 mg/dL (ref 6–20)
CO2: 24 mmol/L (ref 22–32)
Calcium: 7.7 mg/dL — ABNORMAL LOW (ref 8.9–10.3)
Chloride: 106 mmol/L (ref 101–111)
Creatinine, Ser: 1.08 mg/dL — ABNORMAL HIGH (ref 0.44–1.00)
GFR calc non Af Amer: 45 mL/min — ABNORMAL LOW (ref >60)
GFR, EST AFRICAN AMERICAN: 52 mL/min — AB (ref >60)
Glucose, Bld: 94 mg/dL (ref 65–99)
Potassium: 3.9 mmol/L (ref 3.5–5.1)
Sodium: 137 mmol/L (ref 135–145)

## 2014-11-15 LAB — HEPARIN LEVEL (UNFRACTIONATED): Heparin Unfractionated: <0.1 IU/mL — ABNORMAL LOW (ref 0.30–0.70)

## 2014-11-15 MED ORDER — DILTIAZEM HCL ER COATED BEADS 120 MG PO CP24
120.0000 mg | ORAL_CAPSULE | Freq: Two times a day (BID) | ORAL | Status: DC
  Administered 2014-11-15: 120 mg via ORAL
  Filled 2014-11-15 (×2): qty 1

## 2014-11-15 MED ORDER — IRBESARTAN 150 MG PO TABS
150.0000 mg | ORAL_TABLET | Freq: Every day | ORAL | Status: DC
  Administered 2014-11-15: 150 mg via ORAL
  Filled 2014-11-15: qty 1

## 2014-11-15 MED ORDER — ATORVASTATIN CALCIUM 10 MG PO TABS
10.0000 mg | ORAL_TABLET | Freq: Every day | ORAL | Status: DC
  Filled 2014-11-15: qty 1

## 2014-11-15 MED ORDER — HYDROCHLOROTHIAZIDE 12.5 MG PO CAPS
12.5000 mg | ORAL_CAPSULE | Freq: Every day | ORAL | Status: DC
  Administered 2014-11-15: 12.5 mg via ORAL
  Filled 2014-11-15: qty 1

## 2014-11-15 MED ORDER — VALSARTAN-HYDROCHLOROTHIAZIDE 160-12.5 MG PO TABS: 1.0000 | ORAL_TABLET | Freq: Every day | ORAL | Status: DC

## 2014-11-15 MED ORDER — TICAGRELOR 90 MG PO TABS
45.0000 mg | ORAL_TABLET | Freq: Two times a day (BID) | ORAL | Status: DC
  Administered 2014-11-15: 45 mg via ORAL
  Filled 2014-11-15 (×2): qty 1

## 2014-11-15 MED ORDER — FERROUS SULFATE 325 (65 FE) MG PO TABS
325.0000 mg | ORAL_TABLET | ORAL | Status: DC
  Filled 2014-11-15: qty 1

## 2014-11-15 MED ORDER — ASPIRIN EC 325 MG PO TBEC
325.0000 mg | DELAYED_RELEASE_TABLET | Freq: Every day | ORAL | Status: DC
  Administered 2014-11-15: 325 mg via ORAL
  Filled 2014-11-15: qty 1

## 2014-11-15 MED ORDER — MIRABEGRON ER 50 MG PO TB24
50.0000 mg | ORAL_TABLET | Freq: Every day | ORAL | Status: DC
Start: 2014-11-15 — End: 2014-11-15
  Filled 2014-11-15: qty 1

## 2014-11-15 MED ORDER — VITAMIN D3 125 MCG (5000 UT) PO CAPS: 5000.0000 [IU] | ORAL_CAPSULE | Freq: Every day | ORAL | Status: DC

## 2014-11-15 MED ORDER — TICAGRELOR 90 MG PO TABS: 45.0000 mg | ORAL_TABLET | ORAL | Status: DC

## 2014-11-15 MED ORDER — ASPIRIN 81 MG PO CHEW: 324.0000 mg | CHEWABLE_TABLET | Freq: Every day | ORAL | Status: DC

## 2014-11-15 MED ORDER — LEVOTHYROXINE SODIUM 88 MCG PO TABS
88.0000 ug | ORAL_TABLET | Freq: Every day | ORAL | Status: DC
  Administered 2014-11-15: 88 ug via ORAL
  Filled 2014-11-15 (×2): qty 1

## 2014-11-15 MED ORDER — SERTRALINE HCL 50 MG PO TABS
50.0000 mg | ORAL_TABLET | Freq: Every day | ORAL | Status: DC
  Administered 2014-11-15: 50 mg via ORAL
  Filled 2014-11-15: qty 1

## 2014-11-15 MED ORDER — CALCIUM CARBONATE 600 MG PO TABS: 600.0000 mg | ORAL_TABLET | Freq: Every day | ORAL | Status: DC

## 2014-11-15 MED ORDER — GABAPENTIN 400 MG PO CAPS
400.0000 mg | ORAL_CAPSULE | Freq: Every day | ORAL | Status: DC
  Filled 2014-11-15: qty 1

## 2014-11-15 NOTE — Progress Notes (Signed)
Referring Physician(s): Dr Joya Salm  Chief Complaint:  Basilar artery stenosis Angioplasty 8/10 in IR  Subjective:  Basilar artery angioplasty 8/10 in IR with Dr Estanislado Pandy Has done well overnight Did have some oozing at Rt groin Controlled immediately No complaints Dr Estanislado Pandy has seen and examined pt  Allergies: Hibiclens; Benicar; Codeine; Erythromycin; Lipitor; Other; Tetracyclines & related; Vibramycin; and Vicodin  Medications: Prior to Admission medications   Medication Sig Start Date End Date Taking? Authorizing Provider  aspirin EC 325 MG tablet Take 325 mg by mouth daily.   Yes Historical Provider, MD  atorvastatin (LIPITOR) 10 MG tablet Take 10 mg by mouth at bedtime.    Yes Historical Provider, MD  calcium carbonate (OS-CAL) 600 MG TABS tablet Take 600 mg by mouth daily.   Yes Historical Provider, MD  Cholecalciferol (VITAMIN D3) 5000 UNITS CAPS Take 5,000 Units by mouth daily.   Yes Historical Provider, MD  clopidogrel (PLAVIX) 75 MG tablet Take 150 mg by mouth daily.    Yes Historical Provider, MD  conjugated estrogens (PREMARIN) vaginal cream Place 1 Applicatorful vaginally every other day.   Yes Historical Provider, MD  diltiazem (CARDIZEM CD) 120 MG 24 hr capsule Take 120 mg by mouth 2 (two) times daily.    Yes Historical Provider, MD  ferrous sulfate 325 (65 FE) MG tablet Take 325 mg by mouth 2 (two) times a week. Takes on Monday & Wedensday   Yes Historical Provider, MD  gabapentin (NEURONTIN) 400 MG capsule Take 400 mg by mouth at bedtime.    Yes Historical Provider, MD  levothyroxine (SYNTHROID, LEVOTHROID) 88 MCG tablet Take 88 mcg by mouth daily before breakfast.   Yes Historical Provider, MD  mirabegron ER (MYRBETRIQ) 50 MG TB24 tablet Take 50 mg by mouth daily.   Yes Historical Provider, MD  sertraline (ZOLOFT) 50 MG tablet Take 50 mg by mouth daily.   Yes Historical Provider, MD  ticagrelor (BRILINTA) 90 MG TABS tablet Take 45-90 mg by mouth See admin  instructions. 11/06/14-11/11/14=90 mg twice a day 11/12/14=90 mg daily 11/13/14=45 mg twice a day 11/14/14=45 mg in the morning... Unknowing dosing thereafter   Yes Historical Provider, MD  valsartan-hydrochlorothiazide (DIOVAN-HCT) 160-12.5 MG per tablet Take 1 tablet by mouth daily.   Yes Historical Provider, MD  amoxicillin (AMOXIL) 500 MG capsule Take 2,000 mg by mouth See admin instructions. 1 hour before dental procedure 06/17/14   Historical Provider, MD  ciprofloxacin (CIPRO) 500 MG tablet Take 1,000 mg by mouth daily as needed (chronic dental appt).  10/11/14   Historical Provider, MD  mupirocin ointment (BACTROBAN) 2 % Place 1 application into the nose 2 (two) times daily. 10/04/14   Historical Provider, MD     Vital Signs: BP 103/50 mmHg  Pulse 80  Temp(Src) 98.5 F (36.9 C) (Oral)  Resp 17  Ht 5\' 2"  (1.575 m)  Wt 163 lb 5.8 oz (74.1 kg)  BMI 29.87 kg/m2  SpO2 99%  Physical Exam  Constitutional: She is oriented to person, place, and time.  HENT:  Face symmetrical Puffs cheeks = Smile = appropriate  Eyes: EOM are normal.  Neck: Normal range of motion.  Musculoskeletal: Normal range of motion.  R groin NT; no bleeding No hematoma Clean and dry  Rt foot 2+ pulses  Good strength B Good sensation  Neurological: She is alert and oriented to person, place, and time.  Skin: Skin is warm and dry.  Psychiatric: She has a normal mood and affect. Her behavior is  normal. Judgment and thought content normal.  Nursing note and vitals reviewed.   Imaging: No results found.  Labs:  CBC:  Recent Labs  08/09/14 1517 11/01/14 1606 11/14/14 0744 11/15/14 0510  WBC 6.2 5.4 5.9 7.6  HGB 12.7 11.0* 11.2* 8.8*  HCT 37.2 33.0* 33.2* 26.1*  PLT 183 166 171 145*    COAGS:  Recent Labs  07/11/14 1542 11/05/14 0721 11/14/14 0744  INR 1.05 1.03 1.10  APTT 33  --  31    BMP:  Recent Labs  08/09/14 1517 11/01/14 1606 11/14/14 0744 11/15/14 0510  NA 135 134* 135 137    K 4.1 3.8 3.6 3.9  CL 100* 99* 100* 106  CO2 26 27 27 24   GLUCOSE 100* 89 89 94  BUN 11 11 20 17   CALCIUM 9.4 9.1 9.2 7.7*  CREATININE 0.89 0.93 1.08* 1.08*  GFRNONAA 57* 54* 45* 45*  GFRAA >60 >60 52* 52*    LIVER FUNCTION TESTS:  Recent Labs  07/11/14 1542 11/14/14 0744  BILITOT 0.5 0.7  AST 23 24  ALT 15 19  ALKPHOS 79 74  PROT 6.6 6.4*  ALBUMIN 3.6 3.6    Assessment and Plan:  Basilar artery angioplasty 8/10 Doing well Rt groin clean and dry; NT No bleeding Plan to advance diet Dc foley Dc art line Up to chair then ambulate with assistance Plan to dc home today  Signed: Sita Mangen A 11/15/2014, 8:34 AM   I spent a total of 15 Minutes in face to face in clinical consultation/evaluation, greater than 50% of which was counseling/coordinating care for basilar artery pta

## 2014-11-15 NOTE — Discharge Summary (Signed)
Patient ID: Jade Juarez MRN: [id number] DOB/AGE: 1926/10/10 81 y.o.  Admit date: 11/14/2014 Discharge date: 11/15/2014  Admission Diagnoses: Basilar artery stenosis  Discharge Diagnoses:  Active Problems:   Basilar artery stenosis   Discharged Condition: improved  Hospital Course: Basilar artery stenosis and seen by Dr Everette Rank in [facility]. Referred to Dr Estanislado Pandy for evaluation and treatment of same. Cerebral arteriogram and angioplasty of basilar artery stenosis performed in IR 11/14/2014. Pt has done well overnight in Neuro ICU. No complaints. Slept well, eating well this am. Passing gas; foley out now--awaiting urination on own. Neuro intact per exam Pt has been seen and examined by Dr Estanislado Pandy Plan for discharge today.  Consults: None  Significant Diagnostic Studies: cerebral arteriogram  Treatments: Basilar artery angioplasty  Discharge Exam: Blood pressure 111/50, pulse 77, temperature 98.5 F (36.9 C), temperature source Oral, resp. rate 18, height 5\' 2"  (1.575 m), weight 163 lb 5.8 oz (74.1 kg), SpO2 97 %.  PE:  Heart: RRR Lungs: CTA Abd: soft; +BS Constitutional: She is oriented to person, place, and time.  HENT:  Face symmetrical Puffs cheeks = Smile = appropriate  Eyes: EOM are normal.  Neck: Normal range of motion.  Musculoskeletal: Normal range of motion.  R groin NT; no bleeding No hematoma Clean and dry Rt foot 2+ pulses Good strength B Good sensation  Neurological: She is alert and oriented to person, place, and time.  Skin: Skin is warm and dry.  Psychiatric: She has a normal mood and affect. Her behavior is normal. Judgment and thought content normal.  Nursing note and vitals reviewed.  Results for orders placed or performed during the hospital encounter of 11/14/14  MRSA PCR Screening  Result Value Ref Range   MRSA by PCR NEGATIVE NEGATIVE  APTT  Result Value Ref Range   aPTT 31 24 - 37 seconds  CBC WITH DIFFERENTIAL  Result Value  Ref Range   WBC 5.9 4.0 - 10.5 K/uL   RBC 3.92 3.87 - 5.11 MIL/uL   Hemoglobin 11.2 (L) 12.0 - 15.0 g/dL   HCT 33.2 (L) 36.0 - 46.0 %   MCV 84.7 78.0 - 100.0 fL   MCH 28.6 26.0 - 34.0 pg   MCHC 33.7 30.0 - 36.0 g/dL   RDW 14.4 11.5 - 15.5 %   Platelets 171 150 - 400 K/uL   Neutrophils Relative % 61 43 - 77 %   Neutro Abs 3.7 1.7 - 7.7 K/uL   Lymphocytes Relative 26 12 - 46 %   Lymphs Abs 1.5 0.7 - 4.0 K/uL   Monocytes Relative 8 3 - 12 %   Monocytes Absolute 0.5 0.1 - 1.0 K/uL   Eosinophils Relative 5 0 - 5 %   Eosinophils Absolute 0.3 0.0 - 0.7 K/uL   Basophils Relative 0 0 - 1 %   Basophils Absolute 0.0 0.0 - 0.1 K/uL  Comprehensive metabolic panel  Result Value Ref Range   Sodium 135 135 - 145 mmol/L   Potassium 3.6 3.5 - 5.1 mmol/L   Chloride 100 (L) 101 - 111 mmol/L   CO2 27 22 - 32 mmol/L   Glucose, Bld 89 65 - 99 mg/dL   BUN 20 6 - 20 mg/dL   Creatinine, Ser 1.08 (H) 0.44 - 1.00 mg/dL   Calcium 9.2 8.9 - 10.3 mg/dL   Total Protein 6.4 (L) 6.5 - 8.1 g/dL   Albumin 3.6 3.5 - 5.0 g/dL   AST 24 15 - 41 U/L   ALT 19  14 - 54 U/L   Alkaline Phosphatase 74 38 - 126 U/L   Total Bilirubin 0.7 0.3 - 1.2 mg/dL   GFR calc non Af Amer 45 (L) >60 mL/min   GFR calc Af Amer 52 (L) >60 mL/min   Anion gap 8 5 - 15  Platelet inhibition p2y12  (not at [facility])  Result Value Ref Range   Platelet Function  P2Y12 59 (L) 194 - 418 PRU  Protime-INR  Result Value Ref Range   Prothrombin Time 14.4 11.6 - 15.2 seconds   INR 1.10 0.00 - 1.49  Heparin level (unfractionated)  Result Value Ref Range   Heparin Unfractionated 0.31 0.30 - 0.70 IU/mL  Heparin level (unfractionated)  Result Value Ref Range   Heparin Unfractionated <0.10 (L) 0.30 - 0.70 IU/mL  Basic metabolic panel  Result Value Ref Range   Sodium 137 135 - 145 mmol/L   Potassium 3.9 3.5 - 5.1 mmol/L   Chloride 106 101 - 111 mmol/L   CO2 24 22 - 32 mmol/L   Glucose, Bld 94 65 - 99 mg/dL   BUN 17 6 - 20 mg/dL   Creatinine,  Ser 1.08 (H) 0.44 - 1.00 mg/dL   Calcium 7.7 (L) 8.9 - 10.3 mg/dL   GFR calc non Af Amer 45 (L) >60 mL/min   GFR calc Af Amer 52 (L) >60 mL/min   Anion gap 7 5 - 15  CBC WITH DIFFERENTIAL  Result Value Ref Range   WBC 7.6 4.0 - 10.5 K/uL   RBC 3.07 (L) 3.87 - 5.11 MIL/uL   Hemoglobin 8.8 (L) 12.0 - 15.0 g/dL   HCT 26.1 (L) 36.0 - 46.0 %   MCV 85.0 78.0 - 100.0 fL   MCH 28.7 26.0 - 34.0 pg   MCHC 33.7 30.0 - 36.0 g/dL   RDW 14.6 11.5 - 15.5 %   Platelets 145 (L) 150 - 400 K/uL   Neutrophils Relative % 68 43 - 77 %   Neutro Abs 5.2 1.7 - 7.7 K/uL   Lymphocytes Relative 23 12 - 46 %   Lymphs Abs 1.8 0.7 - 4.0 K/uL   Monocytes Relative 8 3 - 12 %   Monocytes Absolute 0.6 0.1 - 1.0 K/uL   Eosinophils Relative 1 0 - 5 %   Eosinophils Absolute 0.1 0.0 - 0.7 K/uL   Basophils Relative 0 0 - 1 %   Basophils Absolute 0.0 0.0 - 0.1 K/uL    Disposition: Basilar artery angioplasty with Dr Estanislado Pandy 11/14/2014 Has done well Now for discharge Pt has been seen and examined by Dr Estanislado Pandy She has good understanding of dc instructions Continue all home meds Continue Brilinta 45 mg twice daily; ASA 325 mg daily Pt to see Cardiologist next week---if starts Eloquis---must stay on Brilinta at least 3 months To see Dr Estanislado Pandy 2 week follow up---she will hear from scheduler for appt date and time She leaves here with good understanding of plan.  Discharge Instructions    Call MD for:  difficulty breathing, headache or visual disturbances    Complete by:  As directed      Call MD for:  extreme fatigue    Complete by:  As directed      Call MD for:  hives    Complete by:  As directed      Call MD for:  persistant dizziness or light-headedness    Complete by:  As directed      Call MD for:  persistant nausea and  vomiting    Complete by:  As directed      Call MD for:  redness, tenderness, or signs of infection (pain, swelling, redness, odor or green/yellow discharge around incision site)     Complete by:  As directed      Call MD for:  severe uncontrolled pain    Complete by:  As directed      Call MD for:  temperature >100.4    Complete by:  As directed      Diet - low sodium heart healthy    Complete by:  As directed      Discharge instructions    Complete by:  As directed   Return in 2 weeks for follow up with Dr Estanislado Pandy- scheduler will call pt with time and date (call [phone] if questions/issues); resume all home meds; continue Brilinta 45 mg twice daily and ASA 325 mg daily     Discharge wound care:    Complete by:  As directed   Leave bandage on RT groin today; may shower tomorrow and replace bandage with clean band aid daily x 1 week     Driving Restrictions    Complete by:  As directed   No driving x 2 weeks     Increase activity slowly    Complete by:  As directed      Lifting restrictions    Complete by:  As directed   No lifting over 10 lbs x 2 weeks            Medication List    TAKE these medications        amoxicillin 500 MG capsule  Commonly known as:  AMOXIL  Take 2,000 mg by mouth See admin instructions. 1 hour before dental procedure     aspirin EC 325 MG tablet  Take 325 mg by mouth daily.     atorvastatin 10 MG tablet  Commonly known as:  LIPITOR  Take 10 mg by mouth at bedtime.     calcium carbonate 600 MG Tabs tablet  Commonly known as:  OS-CAL  Take 600 mg by mouth daily.     ciprofloxacin 500 MG tablet  Commonly known as:  CIPRO  Take 1,000 mg by mouth daily as needed (chronic dental appt).     conjugated estrogens vaginal cream  Commonly known as:  PREMARIN  Place 1 Applicatorful vaginally every other day.     diltiazem 120 MG 24 hr capsule  Commonly known as:  CARDIZEM CD  Take 120 mg by mouth 2 (two) times daily.     ferrous sulfate 325 (65 FE) MG tablet  Take 325 mg by mouth 2 (two) times a week. Takes on Monday & Wedensday     gabapentin 400 MG capsule  Commonly known as:  NEURONTIN  Take 400 mg by mouth at  bedtime.     levothyroxine 88 MCG tablet  Commonly known as:  SYNTHROID, LEVOTHROID  Take 88 mcg by mouth daily before breakfast.     mupirocin ointment 2 %  Commonly known as:  BACTROBAN  Place 1 application into the nose 2 (two) times daily.     MYRBETRIQ 50 MG Tb24 tablet  Generic drug:  mirabegron ER  Take 50 mg by mouth daily.     sertraline 50 MG tablet  Commonly known as:  ZOLOFT  Take 50 mg by mouth daily.     ticagrelor 90 MG Tabs tablet  Commonly known as:  BRILINTA  Take 45-90 mg  by mouth See admin instructions. 11/06/14-11/11/14=90 mg twice a day 11/12/14=90 mg daily 11/13/14=45 mg twice a day 11/14/14=45 mg in the morning... Unknowing dosing thereafter     valsartan-hydrochlorothiazide 160-12.5 MG per tablet  Commonly known as:  DIOVAN-HCT  Take 1 tablet by mouth daily.     Vitamin D3 5000 UNITS Caps  Take 5,000 Units by mouth daily.          Signed: Zyniah Ferraiolo A 11/15/2014, 9:50 AM   I have spent Greater Than 30 Minutes discharging Jade Juarez.

## 2014-11-22 ENCOUNTER — Telehealth (HOSPITAL_COMMUNITY): Payer: Self-pay | Admitting: *Deleted

## 2014-11-22 ENCOUNTER — Other Ambulatory Visit (HOSPITAL_COMMUNITY): Payer: Self-pay | Admitting: Interventional Radiology

## 2014-11-22 DIAGNOSIS — I771 Stricture of artery: Secondary | ICD-10-CM

## 2014-11-22 NOTE — Telephone Encounter (Signed)
Called and verified pt had medication refilled and scheduled appointment for follow-up fro 9/6 at 2pm

## 2014-12-11 ENCOUNTER — Ambulatory Visit (HOSPITAL_COMMUNITY): Payer: Medicare HMO

## 2014-12-28 ENCOUNTER — Other Ambulatory Visit: Payer: Self-pay | Admitting: Radiology

## 2014-12-28 ENCOUNTER — Ambulatory Visit (HOSPITAL_COMMUNITY)
Admission: RE | Admit: 2014-12-28 | Discharge: 2014-12-28 | Disposition: A | Discharge status: Home / Self Care | Payer: Medicare HMO | Source: Ambulatory Visit | Attending: Interventional Radiology | Admitting: Interventional Radiology

## 2014-12-28 DIAGNOSIS — I771 Stricture of artery: Secondary | ICD-10-CM | POA: Insufficient documentation

## 2014-12-28 LAB — PLATELET INHIBITION P2Y12: Platelet Function  P2Y12: 18 [PRU] — ABNORMAL LOW (ref 194–418)

## 2015-01-08 ENCOUNTER — Telehealth (HOSPITAL_COMMUNITY): Payer: Self-pay | Admitting: *Deleted

## 2015-01-08 NOTE — Telephone Encounter (Signed)
Attempted call no answer, no option for VM

## 2015-01-16 ENCOUNTER — Telehealth (HOSPITAL_COMMUNITY): Payer: Self-pay | Admitting: *Deleted

## 2015-01-16 NOTE — Telephone Encounter (Signed)
Called and spoke with Merlene Pulling patients daughter.  As per Dr. Anette Guarneri instructions patient is to take Brilanta 45mg  daily.  Daughter also asked about scheduling patients next angio.  Will F/U with Dr. Estanislado Pandy and get that scheduled once he confirms timeline

## 2015-01-17 ENCOUNTER — Other Ambulatory Visit (HOSPITAL_COMMUNITY): Payer: Self-pay | Admitting: Interventional Radiology

## 2015-01-17 DIAGNOSIS — I729 Aneurysm of unspecified site: Secondary | ICD-10-CM

## 2015-03-05 ENCOUNTER — Telehealth (HOSPITAL_COMMUNITY): Payer: Self-pay | Admitting: *Deleted

## 2015-03-05 NOTE — Telephone Encounter (Signed)
Called and spoke Ledane pt's daughter per Dr. Estanislado Pandy instructions it is okay for patient to stop ASA 325 and start taking Eloquis 2.5 twice a day and continue brilanta.  Ledane ask that I call Dr. Elonda Husky and let him know.

## 2015-03-15 ENCOUNTER — Ambulatory Visit (HOSPITAL_COMMUNITY)
Admission: RE | Admit: 2015-03-15 | Discharge: 2015-03-15 | Disposition: A | Discharge status: Home / Self Care | Payer: Medicare HMO | Source: Ambulatory Visit | Attending: Interventional Radiology | Admitting: Interventional Radiology

## 2015-03-15 ENCOUNTER — Encounter (HOSPITAL_COMMUNITY): Payer: Self-pay

## 2015-03-15 DIAGNOSIS — I672 Cerebral atherosclerosis: Secondary | ICD-10-CM | POA: Diagnosis not present

## 2015-03-15 DIAGNOSIS — I6501 Occlusion and stenosis of right vertebral artery: Secondary | ICD-10-CM | POA: Diagnosis not present

## 2015-03-15 DIAGNOSIS — R42 Dizziness and giddiness: Secondary | ICD-10-CM | POA: Insufficient documentation

## 2015-03-15 DIAGNOSIS — G450 Vertebro-basilar artery syndrome: Secondary | ICD-10-CM | POA: Insufficient documentation

## 2015-03-15 DIAGNOSIS — I729 Aneurysm of unspecified site: Secondary | ICD-10-CM | POA: Diagnosis present

## 2015-03-15 LAB — POCT I-STAT CREATININE: Creatinine, Ser: 1 mg/dL (ref 0.44–1.00)

## 2015-03-15 MED ORDER — IOHEXOL 350 MG/ML SOLN
50.0000 mL | Freq: Once | INTRAVENOUS | Status: AC | PRN
  Administered 2015-03-15: 50 mL via INTRAVENOUS

## 2015-03-18 ENCOUNTER — Telehealth (HOSPITAL_COMMUNITY): Payer: Self-pay | Admitting: Interventional Radiology

## 2015-03-18 NOTE — Telephone Encounter (Signed)
Called pt and daughter, left VM for either one of them to call me back. JM

## 2015-03-20 ENCOUNTER — Telehealth (HOSPITAL_COMMUNITY): Payer: Self-pay | Admitting: Interventional Radiology

## 2015-03-20 ENCOUNTER — Other Ambulatory Visit (HOSPITAL_COMMUNITY): Payer: Self-pay | Admitting: Interventional Radiology

## 2015-03-20 DIAGNOSIS — I771 Stricture of artery: Secondary | ICD-10-CM

## 2015-03-20 NOTE — Telephone Encounter (Signed)
Called and spoke with patient and daughter. Told them both that Dr. Estanislado Pandy wanted Jade Juarez to have a cerebral angiogram in March 2017. They agreed and the angiogram is scheduled for 06/11/15. JM

## 2015-03-27 ENCOUNTER — Encounter: Payer: Self-pay | Admitting: Radiology

## 2015-06-10 ENCOUNTER — Other Ambulatory Visit: Payer: Self-pay | Admitting: Radiology

## 2015-06-10 MED ORDER — SODIUM CHLORIDE 0.9 % IV SOLN: INTRAVENOUS | Status: AC

## 2015-06-11 ENCOUNTER — Ambulatory Visit (HOSPITAL_COMMUNITY)
Admission: RE | Admit: 2015-06-11 | Discharge: 2015-06-11 | Disposition: A | Discharge status: Home / Self Care | Payer: Medicare HMO | Source: Ambulatory Visit | Attending: Interventional Radiology | Admitting: Interventional Radiology

## 2015-06-11 DIAGNOSIS — Z7901 Long term (current) use of anticoagulants: Secondary | ICD-10-CM | POA: Diagnosis not present

## 2015-06-11 DIAGNOSIS — Z5309 Procedure and treatment not carried out because of other contraindication: Secondary | ICD-10-CM | POA: Insufficient documentation

## 2015-06-11 DIAGNOSIS — I4891 Unspecified atrial fibrillation: Secondary | ICD-10-CM | POA: Insufficient documentation

## 2015-06-11 DIAGNOSIS — I771 Stricture of artery: Secondary | ICD-10-CM

## 2015-06-11 LAB — CBC WITH DIFFERENTIAL/PLATELET
Basophils Absolute: 0 10*3/uL (ref 0.0–0.1)
Basophils Relative: 0 %
Eosinophils Absolute: 0.2 10*3/uL (ref 0.0–0.7)
Eosinophils Relative: 3 %
HCT: 35.4 % — ABNORMAL LOW (ref 36.0–46.0)
HEMOGLOBIN: 12.4 g/dL (ref 12.0–15.0)
Lymphocytes Relative: 29 %
Lymphs Abs: 1.7 10*3/uL (ref 0.7–4.0)
MCH: 29.2 pg (ref 26.0–34.0)
MCHC: 35 g/dL (ref 30.0–36.0)
MCV: 83.5 fL (ref 78.0–100.0)
Monocytes Absolute: 0.5 10*3/uL (ref 0.1–1.0)
Monocytes Relative: 8 %
NEUTROS ABS: 3.5 10*3/uL (ref 1.7–7.7)
NEUTROS PCT: 60 %
Platelets: 165 10*3/uL (ref 150–400)
RBC: 4.24 MIL/uL (ref 3.87–5.11)
RDW: 14 % (ref 11.5–15.5)
WBC: 5.9 10*3/uL (ref 4.0–10.5)

## 2015-06-11 LAB — PROTIME-INR
INR: 1.23 (ref 0.00–1.49)
Prothrombin Time: 15.7 seconds — ABNORMAL HIGH (ref 11.6–15.2)

## 2015-06-11 LAB — BASIC METABOLIC PANEL
Anion gap: 10 (ref 5–15)
BUN: 12 mg/dL (ref 6–20)
CO2: 27 mmol/L (ref 22–32)
Calcium: 9.8 mg/dL (ref 8.9–10.3)
Chloride: 100 mmol/L — ABNORMAL LOW (ref 101–111)
Creatinine, Ser: 0.96 mg/dL (ref 0.44–1.00)
GFR calc non Af Amer: 52 mL/min — ABNORMAL LOW (ref >60)
Glucose, Bld: 91 mg/dL (ref 65–99)
Potassium: 3.7 mmol/L (ref 3.5–5.1)
Sodium: 137 mmol/L (ref 135–145)

## 2015-06-11 LAB — APTT: aPTT: 35 seconds (ref 24–37)

## 2015-06-11 MED ORDER — SODIUM CHLORIDE 0.9 % IV SOLN: INTRAVENOUS | Status: DC

## 2015-06-11 NOTE — Progress Notes (Signed)
Spoke with pt daughter and pt. Daughter states that Dr Elonda Husky prescribed Eliquis for atrial fibrillation. Daughter states she asked JM about taking eliquis prior to procedure and JM advised her to take all blood thinners, especially eliquis.

## 2015-06-11 NOTE — Progress Notes (Signed)
Patient ID: Jade Juarez, female   DOB: 12/25/1926, 82 y.o.   MRN: [id number] Patient presented today for her cerebral angiogram.  However, she has been taking her eliquis.  She apparently is on this for A. Fib which is prescribed to her by Dr. Elonda Husky.  The patient states she was told she should continue taking her blood thinner prior to this procedure by our staff.  Unfortunately, this should be held for 48 hrs prior to procedure.  Her procedure will have to be rescheduled.  Vick Filter E 8:19 AM 06/11/2015

## 2015-06-13 ENCOUNTER — Other Ambulatory Visit: Payer: Self-pay | Admitting: Radiology

## 2015-06-13 NOTE — Patient Instructions (Signed)
Instructions to arrive at 9am to radiology, NPO after MN, take am meds with sips NO eliquis

## 2015-06-14 ENCOUNTER — Other Ambulatory Visit (HOSPITAL_COMMUNITY): Payer: Self-pay | Admitting: Interventional Radiology

## 2015-06-14 ENCOUNTER — Encounter (HOSPITAL_COMMUNITY): Payer: Self-pay

## 2015-06-14 ENCOUNTER — Ambulatory Visit (HOSPITAL_COMMUNITY)
Admission: RE | Admit: 2015-06-14 | Discharge: 2015-06-14 | Disposition: A | Discharge status: Home / Self Care | Payer: Medicare HMO | Source: Ambulatory Visit | Attending: Interventional Radiology | Admitting: Interventional Radiology

## 2015-06-14 DIAGNOSIS — Z7902 Long term (current) use of antithrombotics/antiplatelets: Secondary | ICD-10-CM | POA: Diagnosis not present

## 2015-06-14 DIAGNOSIS — Z950 Presence of cardiac pacemaker: Secondary | ICD-10-CM | POA: Diagnosis not present

## 2015-06-14 DIAGNOSIS — F419 Anxiety disorder, unspecified: Secondary | ICD-10-CM | POA: Insufficient documentation

## 2015-06-14 DIAGNOSIS — Z86010 Personal history of colonic polyps: Secondary | ICD-10-CM | POA: Insufficient documentation

## 2015-06-14 DIAGNOSIS — M1990 Unspecified osteoarthritis, unspecified site: Secondary | ICD-10-CM | POA: Diagnosis not present

## 2015-06-14 DIAGNOSIS — Z8249 Family history of ischemic heart disease and other diseases of the circulatory system: Secondary | ICD-10-CM | POA: Insufficient documentation

## 2015-06-14 DIAGNOSIS — Z48812 Encounter for surgical aftercare following surgery on the circulatory system: Secondary | ICD-10-CM | POA: Diagnosis not present

## 2015-06-14 DIAGNOSIS — D649 Anemia, unspecified: Secondary | ICD-10-CM | POA: Insufficient documentation

## 2015-06-14 DIAGNOSIS — I4891 Unspecified atrial fibrillation: Secondary | ICD-10-CM | POA: Insufficient documentation

## 2015-06-14 DIAGNOSIS — E039 Hypothyroidism, unspecified: Secondary | ICD-10-CM | POA: Diagnosis not present

## 2015-06-14 DIAGNOSIS — G2581 Restless legs syndrome: Secondary | ICD-10-CM | POA: Diagnosis not present

## 2015-06-14 DIAGNOSIS — K219 Gastro-esophageal reflux disease without esophagitis: Secondary | ICD-10-CM | POA: Insufficient documentation

## 2015-06-14 DIAGNOSIS — E7800 Pure hypercholesterolemia, unspecified: Secondary | ICD-10-CM | POA: Diagnosis not present

## 2015-06-14 DIAGNOSIS — F329 Major depressive disorder, single episode, unspecified: Secondary | ICD-10-CM | POA: Insufficient documentation

## 2015-06-14 DIAGNOSIS — I771 Stricture of artery: Secondary | ICD-10-CM

## 2015-06-14 DIAGNOSIS — K589 Irritable bowel syndrome without diarrhea: Secondary | ICD-10-CM | POA: Insufficient documentation

## 2015-06-14 DIAGNOSIS — Z8673 Personal history of transient ischemic attack (TIA), and cerebral infarction without residual deficits: Secondary | ICD-10-CM | POA: Insufficient documentation

## 2015-06-14 DIAGNOSIS — G450 Vertebro-basilar artery syndrome: Secondary | ICD-10-CM | POA: Diagnosis not present

## 2015-06-14 DIAGNOSIS — Z7901 Long term (current) use of anticoagulants: Secondary | ICD-10-CM | POA: Diagnosis not present

## 2015-06-14 MED ORDER — SODIUM CHLORIDE 0.9 % IV SOLN
INTRAVENOUS | Status: AC
Start: 2015-06-14 — End: 2015-06-14

## 2015-06-14 MED ORDER — ONDANSETRON HCL 4 MG/2ML IJ SOLN
INTRAMUSCULAR | Status: AC
  Administered 2015-06-14: 4 mg via INTRAVENOUS
  Filled 2015-06-14: qty 2

## 2015-06-14 MED ORDER — FENTANYL CITRATE (PF) 100 MCG/2ML IJ SOLN
INTRAMUSCULAR | Status: AC
  Filled 2015-06-14: qty 2

## 2015-06-14 MED ORDER — FENTANYL CITRATE (PF) 100 MCG/2ML IJ SOLN
INTRAMUSCULAR | Status: AC | PRN
  Administered 2015-06-14: 25 ug via INTRAVENOUS

## 2015-06-14 MED ORDER — ONDANSETRON HCL 4 MG/2ML IJ SOLN
INTRAMUSCULAR | Status: AC
  Filled 2015-06-14: qty 2

## 2015-06-14 MED ORDER — HEPARIN SODIUM (PORCINE) 1000 UNIT/ML IJ SOLN
INTRAMUSCULAR | Status: AC
  Filled 2015-06-14: qty 1

## 2015-06-14 MED ORDER — MIDAZOLAM HCL 2 MG/2ML IJ SOLN
INTRAMUSCULAR | Status: AC
  Filled 2015-06-14: qty 2

## 2015-06-14 MED ORDER — LIDOCAINE HCL 1 % IJ SOLN
INTRAMUSCULAR | Status: AC
  Filled 2015-06-14: qty 20

## 2015-06-14 MED ORDER — ONDANSETRON HCL 4 MG/2ML IJ SOLN
4.0000 mg | Freq: Once | INTRAMUSCULAR | Status: AC
  Administered 2015-06-14: 4 mg via INTRAVENOUS

## 2015-06-14 MED ORDER — MIDAZOLAM HCL 2 MG/2ML IJ SOLN
INTRAMUSCULAR | Status: AC | PRN
  Administered 2015-06-14: 1 mg via INTRAVENOUS

## 2015-06-14 MED ORDER — IODIXANOL 320 MG/ML IV SOLN: 150.0000 mL | Freq: Once | INTRAVENOUS | Status: DC | PRN

## 2015-06-14 MED ORDER — HEPARIN SODIUM (PORCINE) 1000 UNIT/ML IJ SOLN
INTRAMUSCULAR | Status: AC | PRN
  Administered 2015-06-14: 500 [IU] via INTRAVENOUS

## 2015-06-14 MED ORDER — IOHEXOL 300 MG/ML  SOLN
150.0000 mL | Freq: Once | INTRAMUSCULAR | Status: AC | PRN
  Administered 2015-06-14: 85 mL via INTRAVENOUS

## 2015-06-14 NOTE — Sedation Documentation (Signed)
Will medicate pt with IV Zofran as ordered pre-procedure to prevent nausea, as requested by pt and family.

## 2015-06-14 NOTE — Sedation Documentation (Signed)
Pressure held until `12pm. dsg intact. Instructions given

## 2015-06-14 NOTE — Sedation Documentation (Signed)
Patient is resting comfortably. 

## 2015-06-14 NOTE — H&P (Signed)
Chief Complaint: follow up s/p angioplasty for basilar artery stenosis  Referring Physician:Dr. Luanne Bras  Supervising Physician: Dr. Luanne Bras  HPI: Jade Juarez is an 82 y.o. female who underwent an arteriogram with angioplasty and revascularization of the basilar artery in August of 2016.  She has done well since then.  Her repeat CT angio in December showed slightly more prominent residual narrowing of the basilar artery stenosis.  She presents today for a follow up cerebral arteriogram.  Past Medical History:  Past Medical History  Diagnosis Date  . Atrial fibrillation ([place])     prescribed Eliquis but hasn't started-waiting until after this procedure per [place]  . High cholesterol     takes Atorvastatin daily  . Anxiety   . Frequent UTI   . Heart murmur   . Restless leg syndrome   . Stool incontinence     at times  . Status post placement of implantable loop recorder 05/2014  . PONV (postoperative nausea and vomiting)   . Hypertension     takes Diltiazem and Diovan daily  . Hypothyroidism     takes Synthroid daily  . Depression     takes Zoloft daily  . Anemia     takes Ferrous Sulfate daily  . History of bronchitis     about a yr ago  . Headache     occasionally  . Stroke [facility])     takes Plavix daily-right sided weakness  . Arthritis     right knees  . Joint pain   . Joint swelling   . GERD (gastroesophageal reflux disease)     hx of-yrs ago  . History of colon polyps   . Diverticulitis     hx of  . Overactive bladder   . Incontinence   . Nocturia   . Presence of permanent cardiac pacemaker   . IBS (irritable bowel syndrome)     PMH  . Wears glasses   . Vertebrobasilar artery insufficiency     Past Surgical History:  Past Surgical History  Procedure Laterality Date  . Eye surgery Bilateral     cataract surgery  . Abdominal hysterectomy    . Bladder tack      x 2  . Colonoscopy    . Breast surgery Right     cyst removed  .  Loop recorder implant  05/23/2014    Medtronic Reveal loop recorder (Dr. Lisa Roca, [facility])  . Radiology with anesthesia N/A 08/15/2014    Procedure: RADIOLOGY WITH ANESTHESIA;  Surgeon: Luanne Bras, MD;  Location: [place];  Service: Radiology;  Laterality: N/A;  . Insert / replace / remove pacemaker    . Radiology with anesthesia N/A 11/05/2014    Procedure: ANGIOPLASTY WITH POSSIBLE STENTING      (RADIOLOGY WITH ANESTHESIA );  Surgeon: Luanne Bras, MD;  Location: [place];  Service: Radiology;  Laterality: N/A;  . Radiology with anesthesia N/A 11/14/2014    Procedure: RADIOLOGY WITH ANESTHESIA;  Surgeon: Luanne Bras, MD;  Location: [employer];  Service: Radiology;  Laterality: N/A;    Family History:  Family History  Problem Relation Age of Onset  . Hypertension Mother   . Heart attack Mother   . Cancer Father     Social History:  reports that she has never smoked. She has never used smokeless tobacco. She reports that she does not drink alcohol or use illicit drugs.  Allergies:  Allergies  Allergen Reactions  . Hibiclens [Chlorhexidine Gluconate]  vomiting  . Benicar [Olmesartan] Other (See Comments)    Headache  . Codeine Nausea Only  . Erythromycin Nausea Only  . Lipitor [Atorvastatin] Other (See Comments)    Myalgia  . Other Nausea Only and Other (See Comments)    Entex ER OTC Cough Medicines - Causes confusion / disorientation   . Tetracyclines & Related Nausea Only  . Vibramycin [Doxycycline] Nausea Only  . Vicodin [Hydrocodone-Acetaminophen] Nausea Only    Medications:   Medication List    ASK your doctor about these medications        amoxicillin 500 MG capsule  Commonly known as:  AMOXIL  Take 2,000 mg by mouth See admin instructions. 1 hour before dental procedure     atorvastatin 10 MG tablet  Commonly known as:  LIPITOR  Take 10 mg by mouth daily.     calcium carbonate 600 MG Tabs tablet  Commonly known as:  OS-CAL  Take 600 mg by mouth daily.  2 tabs     conjugated estrogens vaginal cream  Commonly known as:  PREMARIN  Place 1 Applicatorful vaginally every other day.     diclofenac sodium 1 % Gel  Commonly known as:  VOLTAREN  Apply 2 g topically 2 (two) times daily.     diltiazem 120 MG 24 hr capsule  Commonly known as:  CARDIZEM CD  Take 120 mg by mouth 2 (two) times daily.     ELIQUIS 2.5 MG Tabs tablet  Generic drug:  apixaban  Take 2.5 mg by mouth 2 (two) times daily.     ferrous sulfate 325 (65 FE) MG tablet  Take 325 mg by mouth 2 (two) times a week. Takes on Monday & Wedensday     gabapentin 400 MG capsule  Commonly known as:  NEURONTIN  Take 400 mg by mouth at bedtime.     glucosamine-chondroitin 500-400 MG tablet  Take 1 tablet by mouth daily.     levothyroxine 88 MCG tablet  Commonly known as:  SYNTHROID, LEVOTHROID  Take 88 mcg by mouth daily before breakfast.     montelukast 10 MG tablet  Commonly known as:  SINGULAIR  Take 10 mg by mouth at bedtime.     multivitamin tablet  Take 1 tablet by mouth daily.     sertraline 50 MG tablet  Commonly known as:  ZOLOFT  Take 50 mg by mouth daily.     ticagrelor 90 MG Tabs tablet  Commonly known as:  BRILINTA  Take 45-90 mg by mouth See admin instructions. 11/06/14-11/11/14=90 mg twice a day 11/12/14=90 mg daily 11/13/14=45 mg twice a day 11/14/14=45 mg in the morning... Unknowing dosing thereafter     valsartan-hydrochlorothiazide 160-12.5 MG tablet  Commonly known as:  DIOVAN-HCT  Take 1 tablet by mouth daily.     Vitamin D3 5000 units Caps  Take 5,000 Units by mouth daily.        Please HPI for pertinent positives, otherwise complete 10 system ROS negative.  Mallampati Score: MD Evaluation Airway: WNL Heart: WNL Abdomen: WNL Chest/ Lungs: WNL ASA  Classification: 3 Mallampati/Airway Score: One  Physical Exam: BP 162/82 mmHg  Pulse 71  Resp 72  SpO2 98% There is no weight on file to calculate BMI. General: pleasant, obese black female who  is laying in bed in NAD HEENT: head is normocephalic, atraumatic.  Sclera are noninjected.  PERRL.  Ears and nose without any masses or lesions.  Mouth is pink and moist Heart: regular, rate, and rhythm.  Normal s1,s2. No obvious murmurs, gallops, or rubs noted.  Palpable radial and pedal pulses bilaterally Lungs: CTAB, no wheezes, rhonchi, or rales noted.  Respiratory effort nonlabored Abd: soft, NT, ND, +BS, no masses, hernias, or organomegaly Skin: warm and dry with no masses, lesions, or rashes Psych: A&Ox3 with an appropriate affect.   Labs: No results found for this or any previous visit (from the past 48 hour(s)).  Imaging: No results found.  Assessment/Plan 1. H/o basilar artery stenosis, s/p revascularization and angioplasty in 11/2014. -will plan for cerebral arteriogram today to follow up on patient's above condition. -vitals are normal.  Labs are pending. -Risks and Benefits discussed with the patient including, but not limited to bleeding, infection, vascular injury or contrast induced renal failure. All of the patient's questions were answered, patient is agreeable to proceed. Consent signed and in chart.  Thank you for this interesting consult.  I greatly enjoyed meeting Jade Juarez and look forward to participating in their care.  A copy of this report was sent to the requesting provider on this date.  Electronically Signed: Henreitta Cea 06/14/2015, 9:52 AM   I spent a total of    25 Minutes in face to face in clinical consultation, greater than 50% of which was counseling/coordinating care for basilar artery stenosis

## 2015-06-14 NOTE — Sedation Documentation (Signed)
IR tech holding pressure to R groin 

## 2015-06-14 NOTE — Sedation Documentation (Signed)
IR staff to place exoseal to R groin

## 2015-06-14 NOTE — Sedation Documentation (Signed)
No exoseal- Vpad to R groin- to be removed by [facility] RN

## 2015-06-14 NOTE — Procedures (Signed)
S/P 4 vessel cerebral arteriogram. RT CVFA approach. Findings.Marland Kitchen 1.Approx 30% stenosis of mid basilar artert angioplastied site. 2aprox 60 % stenosis of prox  Sup division of LT MCA . 3.Approx 60  To 70 % stenosis at origin of RT [place]

## 2015-06-14 NOTE — Discharge Instructions (Signed)
Angiogram, Care After °Refer to this sheet in the next few weeks. These instructions provide you with information about caring for yourself after your procedure. Your health care provider may also give you more specific instructions. Your treatment has been planned according to current medical practices, but problems sometimes occur. Call your health care provider if you have any problems or questions after your procedure. °WHAT TO EXPECT AFTER THE PROCEDURE °After your procedure, it is typical to have the following: °· Bruising at the catheter insertion site that usually fades within 1-2 weeks. °· Blood collecting in the tissue (hematoma) that may be painful to the touch. It should usually decrease in size and tenderness within 1-2 weeks. °HOME CARE INSTRUCTIONS °· Take medicines only as directed by your health care provider. °· You may shower 24-48 hours after the procedure or as directed by your health care provider. Remove the bandage (dressing) and gently wash the site with plain soap and water. Pat the area dry with a clean towel. Do not rub the site, because this may cause bleeding. °· Do not take baths, swim, or use a hot tub until your health care provider approves. °· Check your insertion site every day for redness, swelling, or drainage. °· Do not apply powder or lotion to the site. °· Do not lift over 10 lb (4.5 kg) for 5 days after your procedure or as directed by your health care provider. °· Ask your health care provider when it is okay to: °¨ Return to work or school. °¨ Resume usual physical activities or sports. °¨ Resume sexual activity. °· Do not drive home if you are discharged the same day as the procedure. Have someone else drive you. °· You may drive 24 hours after the procedure unless otherwise instructed by your health care provider. °· Do not operate machinery or power tools for 24 hours after the procedure or as directed by your health care provider. °· If your procedure was done as an  outpatient procedure, which means that you went home the same day as your procedure, a responsible adult should be with you for the first 24 hours after you arrive home. °· Keep all follow-up visits as directed by your health care provider. This is important. °SEEK MEDICAL CARE IF: °· You have a fever. °· You have chills. °· You have increased bleeding from the catheter insertion site. Hold pressure on the site.  CALL 911 °SEEK IMMEDIATE MEDICAL CARE IF: °· You have unusual pain at the catheter insertion site. °· You have redness, warmth, or swelling at the catheter insertion site. °· You have drainage (other than a small amount of blood on the dressing) from the catheter insertion site. °· The catheter insertion site is bleeding, and the bleeding does not stop after 30 minutes of holding steady pressure on the site. °· The area near or just beyond the catheter insertion site becomes pale, cool, tingly, or numb. °  °This information is not intended to replace advice given to you by your health care provider. Make sure you discuss any questions you have with your health care provider. °  °Document Released: 10/09/2004 Document Revised: 04/13/2014 Document Reviewed: 08/24/2012 °Elsevier Interactive Patient Education ©2016 Else[employer] Inc[employer] ° °

## 2015-06-20 ENCOUNTER — Other Ambulatory Visit (HOSPITAL_COMMUNITY): Payer: Self-pay | Admitting: Interventional Radiology

## 2015-06-20 DIAGNOSIS — I771 Stricture of artery: Secondary | ICD-10-CM

## 2015-12-08 ENCOUNTER — Other Ambulatory Visit (HOSPITAL_COMMUNITY): Payer: Self-pay | Admitting: Interventional Radiology

## 2015-12-10 ENCOUNTER — Ambulatory Visit (HOSPITAL_COMMUNITY)
Admission: RE | Admit: 2015-12-10 | Discharge: 2015-12-10 | Disposition: A | Discharge status: Home / Self Care | Payer: Medicare HMO | Source: Ambulatory Visit | Attending: Interventional Radiology | Admitting: Interventional Radiology

## 2015-12-10 DIAGNOSIS — I771 Stricture of artery: Secondary | ICD-10-CM

## 2015-12-10 DIAGNOSIS — I6501 Occlusion and stenosis of right vertebral artery: Secondary | ICD-10-CM | POA: Insufficient documentation

## 2015-12-10 LAB — POCT I-STAT CREATININE: Creatinine, Ser: 1 mg/dL (ref 0.44–1.00)

## 2015-12-10 MED ORDER — IOPAMIDOL (ISOVUE-370) INJECTION 76%
INTRAVENOUS | Status: AC
  Administered 2015-12-10: 50 mL
  Filled 2015-12-10: qty 50

## 2015-12-17 ENCOUNTER — Telehealth (HOSPITAL_COMMUNITY): Payer: Self-pay

## 2015-12-17 ENCOUNTER — Other Ambulatory Visit (HOSPITAL_COMMUNITY): Payer: Self-pay | Admitting: Interventional Radiology

## 2015-12-17 DIAGNOSIS — I771 Stricture of artery: Secondary | ICD-10-CM

## 2015-12-17 NOTE — Telephone Encounter (Signed)
Left message for pt's daughter to return call. AW 

## 2016-01-28 ENCOUNTER — Telehealth: Payer: Self-pay | Admitting: General Surgery

## 2016-01-28 NOTE — Progress Notes (Signed)
brilinta 45mg  daily, 90 tablets called into pharmacy.  Jade Juarez E

## 2016-04-15 DIAGNOSIS — L700 Acne vulgaris: Secondary | ICD-10-CM | POA: Diagnosis not present

## 2016-04-16 DIAGNOSIS — K5909 Other constipation: Secondary | ICD-10-CM | POA: Diagnosis not present

## 2016-04-16 DIAGNOSIS — R112 Nausea with vomiting, unspecified: Secondary | ICD-10-CM | POA: Diagnosis not present

## 2016-04-16 DIAGNOSIS — K5900 Constipation, unspecified: Secondary | ICD-10-CM | POA: Diagnosis not present

## 2016-04-20 DIAGNOSIS — Z950 Presence of cardiac pacemaker: Secondary | ICD-10-CM | POA: Diagnosis not present

## 2016-05-07 DIAGNOSIS — I10 Essential (primary) hypertension: Secondary | ICD-10-CM | POA: Diagnosis not present

## 2016-05-07 DIAGNOSIS — R911 Solitary pulmonary nodule: Secondary | ICD-10-CM | POA: Diagnosis not present

## 2016-05-07 DIAGNOSIS — R918 Other nonspecific abnormal finding of lung field: Secondary | ICD-10-CM | POA: Diagnosis not present

## 2016-05-07 DIAGNOSIS — E782 Mixed hyperlipidemia: Secondary | ICD-10-CM | POA: Diagnosis not present

## 2016-05-14 DIAGNOSIS — F0150 Vascular dementia without behavioral disturbance: Secondary | ICD-10-CM | POA: Diagnosis not present

## 2016-05-14 DIAGNOSIS — D500 Iron deficiency anemia secondary to blood loss (chronic): Secondary | ICD-10-CM | POA: Diagnosis not present

## 2016-05-14 DIAGNOSIS — Z950 Presence of cardiac pacemaker: Secondary | ICD-10-CM | POA: Diagnosis not present

## 2016-05-14 DIAGNOSIS — I480 Paroxysmal atrial fibrillation: Secondary | ICD-10-CM | POA: Diagnosis not present

## 2016-05-14 DIAGNOSIS — I10 Essential (primary) hypertension: Secondary | ICD-10-CM | POA: Diagnosis not present

## 2016-05-14 DIAGNOSIS — E782 Mixed hyperlipidemia: Secondary | ICD-10-CM | POA: Diagnosis not present

## 2016-05-14 DIAGNOSIS — E039 Hypothyroidism, unspecified: Secondary | ICD-10-CM | POA: Diagnosis not present

## 2016-05-26 DIAGNOSIS — N3941 Urge incontinence: Secondary | ICD-10-CM | POA: Diagnosis not present

## 2016-06-01 DIAGNOSIS — N3941 Urge incontinence: Secondary | ICD-10-CM | POA: Diagnosis not present

## 2016-06-11 ENCOUNTER — Ambulatory Visit (HOSPITAL_COMMUNITY)
Admission: RE | Admit: 2016-06-11 | Discharge: 2016-06-11 | Disposition: A | Discharge status: Home / Self Care | Payer: PPO | Source: Ambulatory Visit | Attending: Interventional Radiology | Admitting: Interventional Radiology

## 2016-06-11 ENCOUNTER — Encounter (HOSPITAL_COMMUNITY): Payer: Self-pay

## 2016-06-11 ENCOUNTER — Ambulatory Visit (HOSPITAL_COMMUNITY): Payer: PPO

## 2016-06-11 DIAGNOSIS — I771 Stricture of artery: Secondary | ICD-10-CM | POA: Diagnosis not present

## 2016-06-11 DIAGNOSIS — Z09 Encounter for follow-up examination after completed treatment for conditions other than malignant neoplasm: Secondary | ICD-10-CM | POA: Insufficient documentation

## 2016-06-11 DIAGNOSIS — I6523 Occlusion and stenosis of bilateral carotid arteries: Secondary | ICD-10-CM | POA: Diagnosis not present

## 2016-06-11 LAB — POCT I-STAT CREATININE: Creatinine, Ser: 1 mg/dL (ref 0.44–1.00)

## 2016-06-11 MED ORDER — IOPAMIDOL (ISOVUE-370) INJECTION 76%
INTRAVENOUS | Status: DC
Start: 2016-06-11 — End: 2016-06-12
  Filled 2016-06-11: qty 50

## 2016-06-22 ENCOUNTER — Telehealth (HOSPITAL_COMMUNITY): Payer: Self-pay

## 2016-06-22 NOTE — Telephone Encounter (Signed)
Called, left message for pt's daughter to return call. AW

## 2016-06-23 ENCOUNTER — Telehealth (HOSPITAL_COMMUNITY): Payer: Self-pay

## 2016-06-23 NOTE — Telephone Encounter (Signed)
Pt agreed to f/u in 6 months with cta head and neck. AW 

## 2016-06-29 ENCOUNTER — Other Ambulatory Visit (HOSPITAL_COMMUNITY): Payer: Self-pay | Admitting: Interventional Radiology

## 2016-06-29 DIAGNOSIS — I771 Stricture of artery: Secondary | ICD-10-CM

## 2016-07-08 DIAGNOSIS — Z0000 Encounter for general adult medical examination without abnormal findings: Secondary | ICD-10-CM | POA: Diagnosis not present

## 2016-07-14 DIAGNOSIS — N3941 Urge incontinence: Secondary | ICD-10-CM | POA: Diagnosis not present

## 2016-07-24 DIAGNOSIS — J309 Allergic rhinitis, unspecified: Secondary | ICD-10-CM | POA: Diagnosis not present

## 2016-08-17 DIAGNOSIS — M8588 Other specified disorders of bone density and structure, other site: Secondary | ICD-10-CM | POA: Diagnosis not present

## 2016-08-17 DIAGNOSIS — Z13820 Encounter for screening for osteoporosis: Secondary | ICD-10-CM | POA: Diagnosis not present

## 2016-08-17 DIAGNOSIS — Z1231 Encounter for screening mammogram for malignant neoplasm of breast: Secondary | ICD-10-CM | POA: Diagnosis not present

## 2016-08-21 DIAGNOSIS — I10 Essential (primary) hypertension: Secondary | ICD-10-CM | POA: Diagnosis not present

## 2016-08-21 DIAGNOSIS — F3341 Major depressive disorder, recurrent, in partial remission: Secondary | ICD-10-CM | POA: Diagnosis not present

## 2016-08-21 DIAGNOSIS — E782 Mixed hyperlipidemia: Secondary | ICD-10-CM | POA: Diagnosis not present

## 2016-08-21 DIAGNOSIS — J45991 Cough variant asthma: Secondary | ICD-10-CM | POA: Diagnosis not present

## 2016-08-21 DIAGNOSIS — E039 Hypothyroidism, unspecified: Secondary | ICD-10-CM | POA: Diagnosis not present

## 2016-08-21 DIAGNOSIS — R3915 Urgency of urination: Secondary | ICD-10-CM | POA: Diagnosis not present

## 2016-08-21 DIAGNOSIS — F0150 Vascular dementia without behavioral disturbance: Secondary | ICD-10-CM | POA: Diagnosis not present

## 2016-08-21 DIAGNOSIS — I480 Paroxysmal atrial fibrillation: Secondary | ICD-10-CM | POA: Diagnosis not present

## 2016-08-24 DIAGNOSIS — R8271 Bacteriuria: Secondary | ICD-10-CM | POA: Diagnosis not present

## 2016-08-24 DIAGNOSIS — R399 Unspecified symptoms and signs involving the genitourinary system: Secondary | ICD-10-CM | POA: Diagnosis not present

## 2016-09-11 DIAGNOSIS — H52221 Regular astigmatism, right eye: Secondary | ICD-10-CM | POA: Diagnosis not present

## 2016-09-11 DIAGNOSIS — H52222 Regular astigmatism, left eye: Secondary | ICD-10-CM | POA: Diagnosis not present

## 2016-09-11 DIAGNOSIS — H5211 Myopia, right eye: Secondary | ICD-10-CM | POA: Diagnosis not present

## 2016-10-01 DIAGNOSIS — N3941 Urge incontinence: Secondary | ICD-10-CM | POA: Diagnosis not present

## 2016-10-01 DIAGNOSIS — Z87440 Personal history of urinary (tract) infections: Secondary | ICD-10-CM | POA: Diagnosis not present

## 2016-10-05 DIAGNOSIS — G2581 Restless legs syndrome: Secondary | ICD-10-CM | POA: Diagnosis not present

## 2016-10-05 DIAGNOSIS — F0150 Vascular dementia without behavioral disturbance: Secondary | ICD-10-CM | POA: Diagnosis not present

## 2016-10-05 DIAGNOSIS — I651 Occlusion and stenosis of basilar artery: Secondary | ICD-10-CM | POA: Diagnosis not present

## 2016-11-11 DIAGNOSIS — I10 Essential (primary) hypertension: Secondary | ICD-10-CM | POA: Diagnosis not present

## 2016-11-11 DIAGNOSIS — D500 Iron deficiency anemia secondary to blood loss (chronic): Secondary | ICD-10-CM | POA: Diagnosis not present

## 2016-11-11 DIAGNOSIS — E782 Mixed hyperlipidemia: Secondary | ICD-10-CM | POA: Diagnosis not present

## 2016-11-11 DIAGNOSIS — E039 Hypothyroidism, unspecified: Secondary | ICD-10-CM | POA: Diagnosis not present

## 2016-11-17 DIAGNOSIS — R7301 Impaired fasting glucose: Secondary | ICD-10-CM | POA: Diagnosis not present

## 2016-11-17 DIAGNOSIS — Z87440 Personal history of urinary (tract) infections: Secondary | ICD-10-CM | POA: Diagnosis not present

## 2016-11-17 DIAGNOSIS — E782 Mixed hyperlipidemia: Secondary | ICD-10-CM | POA: Diagnosis not present

## 2016-11-17 DIAGNOSIS — I480 Paroxysmal atrial fibrillation: Secondary | ICD-10-CM | POA: Diagnosis not present

## 2016-11-17 DIAGNOSIS — F0150 Vascular dementia without behavioral disturbance: Secondary | ICD-10-CM | POA: Diagnosis not present

## 2016-11-17 DIAGNOSIS — J45991 Cough variant asthma: Secondary | ICD-10-CM | POA: Diagnosis not present

## 2016-12-15 ENCOUNTER — Ambulatory Visit (HOSPITAL_COMMUNITY): Payer: PPO

## 2016-12-15 DIAGNOSIS — N3941 Urge incontinence: Secondary | ICD-10-CM | POA: Diagnosis not present

## 2016-12-22 DIAGNOSIS — G2581 Restless legs syndrome: Secondary | ICD-10-CM | POA: Diagnosis not present

## 2016-12-22 DIAGNOSIS — F0150 Vascular dementia without behavioral disturbance: Secondary | ICD-10-CM | POA: Diagnosis not present

## 2016-12-22 DIAGNOSIS — I651 Occlusion and stenosis of basilar artery: Secondary | ICD-10-CM | POA: Diagnosis not present

## 2017-01-14 DIAGNOSIS — N3281 Overactive bladder: Secondary | ICD-10-CM | POA: Diagnosis not present

## 2017-01-14 DIAGNOSIS — N3941 Urge incontinence: Secondary | ICD-10-CM | POA: Diagnosis not present

## 2017-01-14 DIAGNOSIS — N390 Urinary tract infection, site not specified: Secondary | ICD-10-CM | POA: Diagnosis not present

## 2017-01-21 DIAGNOSIS — Z45018 Encounter for adjustment and management of other part of cardiac pacemaker: Secondary | ICD-10-CM | POA: Diagnosis not present

## 2017-01-21 DIAGNOSIS — I495 Sick sinus syndrome: Secondary | ICD-10-CM | POA: Diagnosis not present

## 2017-02-16 ENCOUNTER — Ambulatory Visit (HOSPITAL_COMMUNITY)
Admission: RE | Admit: 2017-02-16 | Discharge: 2017-02-16 | Disposition: A | Discharge status: Home / Self Care | Payer: PPO | Source: Ambulatory Visit | Attending: Interventional Radiology | Admitting: Interventional Radiology

## 2017-02-16 ENCOUNTER — Encounter (HOSPITAL_COMMUNITY): Payer: Self-pay

## 2017-02-16 ENCOUNTER — Ambulatory Visit (HOSPITAL_COMMUNITY): Payer: PPO

## 2017-02-16 DIAGNOSIS — I6523 Occlusion and stenosis of bilateral carotid arteries: Secondary | ICD-10-CM | POA: Diagnosis not present

## 2017-02-16 DIAGNOSIS — R55 Syncope and collapse: Secondary | ICD-10-CM | POA: Diagnosis not present

## 2017-02-16 DIAGNOSIS — I771 Stricture of artery: Secondary | ICD-10-CM | POA: Diagnosis not present

## 2017-02-16 DIAGNOSIS — R5383 Other fatigue: Secondary | ICD-10-CM | POA: Diagnosis not present

## 2017-02-16 DIAGNOSIS — I639 Cerebral infarction, unspecified: Secondary | ICD-10-CM | POA: Diagnosis not present

## 2017-02-16 DIAGNOSIS — I7389 Other specified peripheral vascular diseases: Secondary | ICD-10-CM | POA: Insufficient documentation

## 2017-02-16 LAB — POCT I-STAT CREATININE: CREATININE: 1 mg/dL (ref 0.44–1.00)

## 2017-02-16 MED ORDER — IOPAMIDOL (ISOVUE-370) INJECTION 76%
INTRAVENOUS | Status: AC
  Administered 2017-02-16: 50 mL
  Filled 2017-02-16: qty 50

## 2017-02-18 DIAGNOSIS — I10 Essential (primary) hypertension: Secondary | ICD-10-CM | POA: Diagnosis not present

## 2017-02-18 DIAGNOSIS — E559 Vitamin D deficiency, unspecified: Secondary | ICD-10-CM | POA: Diagnosis not present

## 2017-02-18 DIAGNOSIS — D500 Iron deficiency anemia secondary to blood loss (chronic): Secondary | ICD-10-CM | POA: Diagnosis not present

## 2017-02-18 DIAGNOSIS — E039 Hypothyroidism, unspecified: Secondary | ICD-10-CM | POA: Diagnosis not present

## 2017-02-18 DIAGNOSIS — E782 Mixed hyperlipidemia: Secondary | ICD-10-CM | POA: Diagnosis not present

## 2017-02-18 DIAGNOSIS — I651 Occlusion and stenosis of basilar artery: Secondary | ICD-10-CM | POA: Diagnosis not present

## 2017-03-02 ENCOUNTER — Telehealth (HOSPITAL_COMMUNITY): Payer: Self-pay

## 2017-03-02 NOTE — Telephone Encounter (Signed)
Pt agreed to f/u in 6 months with cta head/neck. AW 

## 2017-03-03 DIAGNOSIS — N3941 Urge incontinence: Secondary | ICD-10-CM | POA: Diagnosis not present

## 2017-04-08 DIAGNOSIS — N952 Postmenopausal atrophic vaginitis: Secondary | ICD-10-CM | POA: Diagnosis not present

## 2017-04-08 DIAGNOSIS — N3281 Overactive bladder: Secondary | ICD-10-CM | POA: Diagnosis not present

## 2017-04-08 DIAGNOSIS — R3915 Urgency of urination: Secondary | ICD-10-CM | POA: Diagnosis not present

## 2017-04-08 DIAGNOSIS — R350 Frequency of micturition: Secondary | ICD-10-CM | POA: Diagnosis not present

## 2017-04-08 DIAGNOSIS — R300 Dysuria: Secondary | ICD-10-CM | POA: Diagnosis not present

## 2017-04-21 DIAGNOSIS — L853 Xerosis cutis: Secondary | ICD-10-CM | POA: Diagnosis not present

## 2017-04-21 DIAGNOSIS — D239 Other benign neoplasm of skin, unspecified: Secondary | ICD-10-CM | POA: Diagnosis not present

## 2017-04-21 DIAGNOSIS — L821 Other seborrheic keratosis: Secondary | ICD-10-CM | POA: Diagnosis not present

## 2017-04-23 DIAGNOSIS — E785 Hyperlipidemia, unspecified: Secondary | ICD-10-CM | POA: Diagnosis not present

## 2017-04-23 DIAGNOSIS — I10 Essential (primary) hypertension: Secondary | ICD-10-CM | POA: Diagnosis not present

## 2017-04-23 DIAGNOSIS — I480 Paroxysmal atrial fibrillation: Secondary | ICD-10-CM | POA: Diagnosis not present

## 2017-04-23 DIAGNOSIS — Z950 Presence of cardiac pacemaker: Secondary | ICD-10-CM | POA: Diagnosis not present

## 2017-04-28 DIAGNOSIS — K811 Chronic cholecystitis: Secondary | ICD-10-CM | POA: Diagnosis not present

## 2017-04-28 DIAGNOSIS — E7800 Pure hypercholesterolemia, unspecified: Secondary | ICD-10-CM | POA: Diagnosis not present

## 2017-04-28 DIAGNOSIS — J45909 Unspecified asthma, uncomplicated: Secondary | ICD-10-CM | POA: Diagnosis not present

## 2017-04-28 DIAGNOSIS — G609 Hereditary and idiopathic neuropathy, unspecified: Secondary | ICD-10-CM | POA: Diagnosis not present

## 2017-04-28 DIAGNOSIS — R112 Nausea with vomiting, unspecified: Secondary | ICD-10-CM | POA: Diagnosis not present

## 2017-04-28 DIAGNOSIS — D509 Iron deficiency anemia, unspecified: Secondary | ICD-10-CM | POA: Diagnosis not present

## 2017-04-28 DIAGNOSIS — E039 Hypothyroidism, unspecified: Secondary | ICD-10-CM | POA: Diagnosis not present

## 2017-04-28 DIAGNOSIS — R062 Wheezing: Secondary | ICD-10-CM | POA: Diagnosis not present

## 2017-04-28 DIAGNOSIS — K5730 Diverticulosis of large intestine without perforation or abscess without bleeding: Secondary | ICD-10-CM | POA: Diagnosis not present

## 2017-04-28 DIAGNOSIS — R109 Unspecified abdominal pain: Secondary | ICD-10-CM | POA: Diagnosis not present

## 2017-04-28 DIAGNOSIS — R531 Weakness: Secondary | ICD-10-CM | POA: Diagnosis not present

## 2017-04-28 DIAGNOSIS — R1011 Right upper quadrant pain: Secondary | ICD-10-CM | POA: Diagnosis not present

## 2017-04-28 DIAGNOSIS — I480 Paroxysmal atrial fibrillation: Secondary | ICD-10-CM | POA: Diagnosis not present

## 2017-04-28 DIAGNOSIS — M8580 Other specified disorders of bone density and structure, unspecified site: Secondary | ICD-10-CM | POA: Diagnosis not present

## 2017-04-28 DIAGNOSIS — I6509 Occlusion and stenosis of unspecified vertebral artery: Secondary | ICD-10-CM | POA: Diagnosis not present

## 2017-04-28 DIAGNOSIS — R1031 Right lower quadrant pain: Secondary | ICD-10-CM | POA: Diagnosis not present

## 2017-04-28 DIAGNOSIS — K810 Acute cholecystitis: Secondary | ICD-10-CM | POA: Diagnosis not present

## 2017-04-28 DIAGNOSIS — R197 Diarrhea, unspecified: Secondary | ICD-10-CM | POA: Diagnosis not present

## 2017-04-28 DIAGNOSIS — K8020 Calculus of gallbladder without cholecystitis without obstruction: Secondary | ICD-10-CM | POA: Diagnosis not present

## 2017-04-28 DIAGNOSIS — I10 Essential (primary) hypertension: Secondary | ICD-10-CM | POA: Diagnosis not present

## 2017-04-28 DIAGNOSIS — R1032 Left lower quadrant pain: Secondary | ICD-10-CM | POA: Diagnosis not present

## 2017-04-28 DIAGNOSIS — K5900 Constipation, unspecified: Secondary | ICD-10-CM | POA: Diagnosis not present

## 2017-04-28 DIAGNOSIS — R0989 Other specified symptoms and signs involving the circulatory and respiratory systems: Secondary | ICD-10-CM | POA: Diagnosis not present

## 2017-04-28 DIAGNOSIS — K8000 Calculus of gallbladder with acute cholecystitis without obstruction: Secondary | ICD-10-CM | POA: Diagnosis not present

## 2017-04-28 DIAGNOSIS — Z8679 Personal history of other diseases of the circulatory system: Secondary | ICD-10-CM | POA: Diagnosis not present

## 2017-04-28 DIAGNOSIS — E785 Hyperlipidemia, unspecified: Secondary | ICD-10-CM | POA: Diagnosis not present

## 2017-04-28 DIAGNOSIS — E559 Vitamin D deficiency, unspecified: Secondary | ICD-10-CM | POA: Diagnosis not present

## 2017-04-28 DIAGNOSIS — F0150 Vascular dementia without behavioral disturbance: Secondary | ICD-10-CM | POA: Diagnosis not present

## 2017-04-28 DIAGNOSIS — E876 Hypokalemia: Secondary | ICD-10-CM | POA: Diagnosis not present

## 2017-04-28 DIAGNOSIS — R1030 Lower abdominal pain, unspecified: Secondary | ICD-10-CM | POA: Diagnosis not present

## 2017-04-28 DIAGNOSIS — Z7901 Long term (current) use of anticoagulants: Secondary | ICD-10-CM | POA: Diagnosis not present

## 2017-04-28 DIAGNOSIS — G2581 Restless legs syndrome: Secondary | ICD-10-CM | POA: Diagnosis not present

## 2017-04-28 DIAGNOSIS — F329 Major depressive disorder, single episode, unspecified: Secondary | ICD-10-CM | POA: Diagnosis not present

## 2017-04-30 DIAGNOSIS — R112 Nausea with vomiting, unspecified: Secondary | ICD-10-CM | POA: Diagnosis not present

## 2017-04-30 DIAGNOSIS — R109 Unspecified abdominal pain: Secondary | ICD-10-CM | POA: Diagnosis not present

## 2017-04-30 DIAGNOSIS — K5730 Diverticulosis of large intestine without perforation or abscess without bleeding: Secondary | ICD-10-CM | POA: Diagnosis not present

## 2017-04-30 DIAGNOSIS — R197 Diarrhea, unspecified: Secondary | ICD-10-CM | POA: Diagnosis not present

## 2017-04-30 DIAGNOSIS — R1031 Right lower quadrant pain: Secondary | ICD-10-CM | POA: Diagnosis not present

## 2017-05-09 DIAGNOSIS — E559 Vitamin D deficiency, unspecified: Secondary | ICD-10-CM | POA: Diagnosis not present

## 2017-05-09 DIAGNOSIS — E7800 Pure hypercholesterolemia, unspecified: Secondary | ICD-10-CM | POA: Diagnosis not present

## 2017-05-09 DIAGNOSIS — Z7982 Long term (current) use of aspirin: Secondary | ICD-10-CM | POA: Diagnosis not present

## 2017-05-09 DIAGNOSIS — I480 Paroxysmal atrial fibrillation: Secondary | ICD-10-CM | POA: Diagnosis not present

## 2017-05-09 DIAGNOSIS — K810 Acute cholecystitis: Secondary | ICD-10-CM | POA: Diagnosis not present

## 2017-05-09 DIAGNOSIS — G2581 Restless legs syndrome: Secondary | ICD-10-CM | POA: Diagnosis not present

## 2017-05-09 DIAGNOSIS — F3341 Major depressive disorder, recurrent, in partial remission: Secondary | ICD-10-CM | POA: Diagnosis not present

## 2017-05-09 DIAGNOSIS — Z4682 Encounter for fitting and adjustment of non-vascular catheter: Secondary | ICD-10-CM | POA: Diagnosis not present

## 2017-05-09 DIAGNOSIS — I69398 Other sequelae of cerebral infarction: Secondary | ICD-10-CM | POA: Diagnosis not present

## 2017-05-09 DIAGNOSIS — I651 Occlusion and stenosis of basilar artery: Secondary | ICD-10-CM | POA: Diagnosis not present

## 2017-05-09 DIAGNOSIS — Z7901 Long term (current) use of anticoagulants: Secondary | ICD-10-CM | POA: Diagnosis not present

## 2017-05-09 DIAGNOSIS — G629 Polyneuropathy, unspecified: Secondary | ICD-10-CM | POA: Diagnosis not present

## 2017-05-09 DIAGNOSIS — F0150 Vascular dementia without behavioral disturbance: Secondary | ICD-10-CM | POA: Diagnosis not present

## 2017-05-09 DIAGNOSIS — M1711 Unilateral primary osteoarthritis, right knee: Secondary | ICD-10-CM | POA: Diagnosis not present

## 2017-05-09 DIAGNOSIS — J45991 Cough variant asthma: Secondary | ICD-10-CM | POA: Diagnosis not present

## 2017-05-09 DIAGNOSIS — I495 Sick sinus syndrome: Secondary | ICD-10-CM | POA: Diagnosis not present

## 2017-05-09 DIAGNOSIS — D509 Iron deficiency anemia, unspecified: Secondary | ICD-10-CM | POA: Diagnosis not present

## 2017-05-09 DIAGNOSIS — I10 Essential (primary) hypertension: Secondary | ICD-10-CM | POA: Diagnosis not present

## 2017-05-12 DIAGNOSIS — R32 Unspecified urinary incontinence: Secondary | ICD-10-CM | POA: Diagnosis not present

## 2017-05-12 DIAGNOSIS — K810 Acute cholecystitis: Secondary | ICD-10-CM | POA: Diagnosis not present

## 2017-05-12 DIAGNOSIS — I480 Paroxysmal atrial fibrillation: Secondary | ICD-10-CM | POA: Diagnosis not present

## 2017-05-12 DIAGNOSIS — I651 Occlusion and stenosis of basilar artery: Secondary | ICD-10-CM | POA: Diagnosis not present

## 2017-05-13 DIAGNOSIS — Z8719 Personal history of other diseases of the digestive system: Secondary | ICD-10-CM | POA: Diagnosis not present

## 2017-05-27 DIAGNOSIS — R32 Unspecified urinary incontinence: Secondary | ICD-10-CM | POA: Diagnosis not present

## 2017-05-27 DIAGNOSIS — I480 Paroxysmal atrial fibrillation: Secondary | ICD-10-CM | POA: Diagnosis not present

## 2017-05-27 DIAGNOSIS — D500 Iron deficiency anemia secondary to blood loss (chronic): Secondary | ICD-10-CM | POA: Diagnosis not present

## 2017-05-27 DIAGNOSIS — K810 Acute cholecystitis: Secondary | ICD-10-CM | POA: Diagnosis not present

## 2017-05-27 DIAGNOSIS — E876 Hypokalemia: Secondary | ICD-10-CM | POA: Diagnosis not present

## 2017-05-31 DIAGNOSIS — T85518A Breakdown (mechanical) of other gastrointestinal prosthetic devices, implants and grafts, initial encounter: Secondary | ICD-10-CM | POA: Diagnosis not present

## 2017-06-08 DIAGNOSIS — K819 Cholecystitis, unspecified: Secondary | ICD-10-CM | POA: Diagnosis not present

## 2017-06-08 DIAGNOSIS — Z4589 Encounter for adjustment and management of other implanted devices: Secondary | ICD-10-CM | POA: Diagnosis not present

## 2017-06-08 DIAGNOSIS — K8000 Calculus of gallbladder with acute cholecystitis without obstruction: Secondary | ICD-10-CM | POA: Diagnosis not present

## 2017-06-11 DIAGNOSIS — F0150 Vascular dementia without behavioral disturbance: Secondary | ICD-10-CM | POA: Diagnosis not present

## 2017-06-11 DIAGNOSIS — D509 Iron deficiency anemia, unspecified: Secondary | ICD-10-CM | POA: Diagnosis not present

## 2017-06-11 DIAGNOSIS — E559 Vitamin D deficiency, unspecified: Secondary | ICD-10-CM | POA: Diagnosis not present

## 2017-06-11 DIAGNOSIS — Z4682 Encounter for fitting and adjustment of non-vascular catheter: Secondary | ICD-10-CM | POA: Diagnosis not present

## 2017-06-11 DIAGNOSIS — I480 Paroxysmal atrial fibrillation: Secondary | ICD-10-CM | POA: Diagnosis not present

## 2017-06-11 DIAGNOSIS — I495 Sick sinus syndrome: Secondary | ICD-10-CM | POA: Diagnosis not present

## 2017-06-11 DIAGNOSIS — I10 Essential (primary) hypertension: Secondary | ICD-10-CM | POA: Diagnosis not present

## 2017-06-11 DIAGNOSIS — I651 Occlusion and stenosis of basilar artery: Secondary | ICD-10-CM | POA: Diagnosis not present

## 2017-06-11 DIAGNOSIS — K810 Acute cholecystitis: Secondary | ICD-10-CM | POA: Diagnosis not present

## 2017-06-11 DIAGNOSIS — G629 Polyneuropathy, unspecified: Secondary | ICD-10-CM | POA: Diagnosis not present

## 2017-06-11 DIAGNOSIS — I69398 Other sequelae of cerebral infarction: Secondary | ICD-10-CM | POA: Diagnosis not present

## 2017-06-11 DIAGNOSIS — J45991 Cough variant asthma: Secondary | ICD-10-CM | POA: Diagnosis not present

## 2017-06-11 DIAGNOSIS — G2581 Restless legs syndrome: Secondary | ICD-10-CM | POA: Diagnosis not present

## 2017-06-17 DIAGNOSIS — E782 Mixed hyperlipidemia: Secondary | ICD-10-CM | POA: Diagnosis not present

## 2017-06-17 DIAGNOSIS — I480 Paroxysmal atrial fibrillation: Secondary | ICD-10-CM | POA: Diagnosis not present

## 2017-06-17 DIAGNOSIS — F3341 Major depressive disorder, recurrent, in partial remission: Secondary | ICD-10-CM | POA: Diagnosis not present

## 2017-06-17 DIAGNOSIS — F0150 Vascular dementia without behavioral disturbance: Secondary | ICD-10-CM | POA: Diagnosis not present

## 2017-06-21 DIAGNOSIS — I651 Occlusion and stenosis of basilar artery: Secondary | ICD-10-CM | POA: Diagnosis not present

## 2017-06-21 DIAGNOSIS — R531 Weakness: Secondary | ICD-10-CM | POA: Diagnosis not present

## 2017-07-07 DIAGNOSIS — R1114 Bilious vomiting: Secondary | ICD-10-CM | POA: Diagnosis not present

## 2017-07-07 DIAGNOSIS — R109 Unspecified abdominal pain: Secondary | ICD-10-CM | POA: Diagnosis not present

## 2017-07-07 DIAGNOSIS — Z885 Allergy status to narcotic agent status: Secondary | ICD-10-CM | POA: Diagnosis not present

## 2017-07-07 DIAGNOSIS — K8020 Calculus of gallbladder without cholecystitis without obstruction: Secondary | ICD-10-CM | POA: Diagnosis not present

## 2017-07-07 DIAGNOSIS — Z8249 Family history of ischemic heart disease and other diseases of the circulatory system: Secondary | ICD-10-CM | POA: Diagnosis not present

## 2017-07-07 DIAGNOSIS — Z823 Family history of stroke: Secondary | ICD-10-CM | POA: Diagnosis not present

## 2017-07-07 DIAGNOSIS — E039 Hypothyroidism, unspecified: Secondary | ICD-10-CM | POA: Diagnosis not present

## 2017-07-07 DIAGNOSIS — Z8673 Personal history of transient ischemic attack (TIA), and cerebral infarction without residual deficits: Secondary | ICD-10-CM | POA: Diagnosis not present

## 2017-07-07 DIAGNOSIS — R110 Nausea: Secondary | ICD-10-CM | POA: Diagnosis not present

## 2017-07-07 DIAGNOSIS — E871 Hypo-osmolality and hyponatremia: Secondary | ICD-10-CM | POA: Diagnosis not present

## 2017-07-07 DIAGNOSIS — Z888 Allergy status to other drugs, medicaments and biological substances status: Secondary | ICD-10-CM | POA: Diagnosis not present

## 2017-07-07 DIAGNOSIS — Z9049 Acquired absence of other specified parts of digestive tract: Secondary | ICD-10-CM | POA: Diagnosis not present

## 2017-07-07 DIAGNOSIS — R079 Chest pain, unspecified: Secondary | ICD-10-CM | POA: Diagnosis not present

## 2017-07-07 DIAGNOSIS — I10 Essential (primary) hypertension: Secondary | ICD-10-CM | POA: Diagnosis not present

## 2017-07-07 DIAGNOSIS — A0839 Other viral enteritis: Secondary | ICD-10-CM | POA: Diagnosis not present

## 2017-07-07 DIAGNOSIS — K819 Cholecystitis, unspecified: Secondary | ICD-10-CM | POA: Diagnosis not present

## 2017-07-07 DIAGNOSIS — A0811 Acute gastroenteropathy due to Norwalk agent: Secondary | ICD-10-CM | POA: Diagnosis not present

## 2017-07-07 DIAGNOSIS — Z881 Allergy status to other antibiotic agents status: Secondary | ICD-10-CM | POA: Diagnosis not present

## 2017-07-07 DIAGNOSIS — E876 Hypokalemia: Secondary | ICD-10-CM | POA: Diagnosis not present

## 2017-07-07 DIAGNOSIS — Z950 Presence of cardiac pacemaker: Secondary | ICD-10-CM | POA: Diagnosis not present

## 2017-07-07 DIAGNOSIS — R197 Diarrhea, unspecified: Secondary | ICD-10-CM | POA: Diagnosis not present

## 2017-07-07 DIAGNOSIS — R1110 Vomiting, unspecified: Secondary | ICD-10-CM | POA: Diagnosis not present

## 2017-07-07 DIAGNOSIS — Z978 Presence of other specified devices: Secondary | ICD-10-CM | POA: Diagnosis not present

## 2017-07-07 DIAGNOSIS — I6509 Occlusion and stenosis of unspecified vertebral artery: Secondary | ICD-10-CM | POA: Diagnosis not present

## 2017-07-07 DIAGNOSIS — Z7902 Long term (current) use of antithrombotics/antiplatelets: Secondary | ICD-10-CM | POA: Diagnosis not present

## 2017-07-07 DIAGNOSIS — R1084 Generalized abdominal pain: Secondary | ICD-10-CM | POA: Diagnosis not present

## 2017-07-07 DIAGNOSIS — Z7901 Long term (current) use of anticoagulants: Secondary | ICD-10-CM | POA: Diagnosis not present

## 2017-07-07 DIAGNOSIS — F0390 Unspecified dementia without behavioral disturbance: Secondary | ICD-10-CM | POA: Diagnosis not present

## 2017-07-07 DIAGNOSIS — R112 Nausea with vomiting, unspecified: Secondary | ICD-10-CM | POA: Diagnosis not present

## 2017-07-07 DIAGNOSIS — E860 Dehydration: Secondary | ICD-10-CM | POA: Diagnosis not present

## 2017-07-07 DIAGNOSIS — I480 Paroxysmal atrial fibrillation: Secondary | ICD-10-CM | POA: Diagnosis not present

## 2017-07-19 DIAGNOSIS — Z434 Encounter for attention to other artificial openings of digestive tract: Secondary | ICD-10-CM | POA: Diagnosis not present

## 2017-07-19 DIAGNOSIS — F3341 Major depressive disorder, recurrent, in partial remission: Secondary | ICD-10-CM | POA: Diagnosis not present

## 2017-07-19 DIAGNOSIS — N183 Chronic kidney disease, stage 3 (moderate): Secondary | ICD-10-CM | POA: Diagnosis not present

## 2017-07-19 DIAGNOSIS — I495 Sick sinus syndrome: Secondary | ICD-10-CM | POA: Diagnosis not present

## 2017-07-19 DIAGNOSIS — J45991 Cough variant asthma: Secondary | ICD-10-CM | POA: Diagnosis not present

## 2017-07-19 DIAGNOSIS — G629 Polyneuropathy, unspecified: Secondary | ICD-10-CM | POA: Diagnosis not present

## 2017-07-19 DIAGNOSIS — I651 Occlusion and stenosis of basilar artery: Secondary | ICD-10-CM | POA: Diagnosis not present

## 2017-07-19 DIAGNOSIS — I130 Hypertensive heart and chronic kidney disease with heart failure and stage 1 through stage 4 chronic kidney disease, or unspecified chronic kidney disease: Secondary | ICD-10-CM | POA: Diagnosis not present

## 2017-07-19 DIAGNOSIS — I480 Paroxysmal atrial fibrillation: Secondary | ICD-10-CM | POA: Diagnosis not present

## 2017-07-19 DIAGNOSIS — I69398 Other sequelae of cerebral infarction: Secondary | ICD-10-CM | POA: Diagnosis not present

## 2017-07-19 DIAGNOSIS — F0150 Vascular dementia without behavioral disturbance: Secondary | ICD-10-CM | POA: Diagnosis not present

## 2017-07-19 DIAGNOSIS — I509 Heart failure, unspecified: Secondary | ICD-10-CM | POA: Diagnosis not present

## 2017-07-19 DIAGNOSIS — M1711 Unilateral primary osteoarthritis, right knee: Secondary | ICD-10-CM | POA: Diagnosis not present

## 2017-07-22 DIAGNOSIS — Z8719 Personal history of other diseases of the digestive system: Secondary | ICD-10-CM | POA: Diagnosis not present

## 2017-07-28 DIAGNOSIS — R399 Unspecified symptoms and signs involving the genitourinary system: Secondary | ICD-10-CM | POA: Diagnosis not present

## 2017-07-28 DIAGNOSIS — R300 Dysuria: Secondary | ICD-10-CM | POA: Diagnosis not present

## 2017-07-29 DIAGNOSIS — Z8719 Personal history of other diseases of the digestive system: Secondary | ICD-10-CM | POA: Diagnosis not present

## 2017-07-29 DIAGNOSIS — R399 Unspecified symptoms and signs involving the genitourinary system: Secondary | ICD-10-CM | POA: Diagnosis not present

## 2017-07-29 DIAGNOSIS — Z434 Encounter for attention to other artificial openings of digestive tract: Secondary | ICD-10-CM | POA: Diagnosis not present

## 2017-08-05 DIAGNOSIS — Z8719 Personal history of other diseases of the digestive system: Secondary | ICD-10-CM | POA: Diagnosis not present

## 2017-08-10 ENCOUNTER — Other Ambulatory Visit (HOSPITAL_COMMUNITY): Payer: Self-pay | Admitting: Interventional Radiology

## 2017-08-10 DIAGNOSIS — I771 Stricture of artery: Secondary | ICD-10-CM

## 2017-08-19 ENCOUNTER — Ambulatory Visit (HOSPITAL_COMMUNITY): Payer: PPO

## 2017-08-19 ENCOUNTER — Ambulatory Visit (HOSPITAL_COMMUNITY)
Admission: RE | Admit: 2017-08-19 | Discharge: 2017-08-19 | Disposition: A | Discharge status: Home / Self Care | Payer: PPO | Source: Ambulatory Visit | Attending: Interventional Radiology | Admitting: Interventional Radiology

## 2017-08-19 DIAGNOSIS — I651 Occlusion and stenosis of basilar artery: Secondary | ICD-10-CM | POA: Insufficient documentation

## 2017-08-19 DIAGNOSIS — I672 Cerebral atherosclerosis: Secondary | ICD-10-CM | POA: Diagnosis not present

## 2017-08-19 DIAGNOSIS — I771 Stricture of artery: Secondary | ICD-10-CM | POA: Diagnosis not present

## 2017-08-19 DIAGNOSIS — I6523 Occlusion and stenosis of bilateral carotid arteries: Secondary | ICD-10-CM | POA: Diagnosis not present

## 2017-08-19 DIAGNOSIS — I6521 Occlusion and stenosis of right carotid artery: Secondary | ICD-10-CM | POA: Diagnosis not present

## 2017-08-19 DIAGNOSIS — I6501 Occlusion and stenosis of right vertebral artery: Secondary | ICD-10-CM | POA: Diagnosis not present

## 2017-08-19 LAB — POCT I-STAT CREATININE: Creatinine, Ser: 1 mg/dL (ref 0.44–1.00)

## 2017-08-19 MED ORDER — IOPAMIDOL (ISOVUE-370) INJECTION 76%
INTRAVENOUS | Status: AC
  Filled 2017-08-19: qty 50

## 2017-08-19 MED ORDER — IOPAMIDOL (ISOVUE-370) INJECTION 76%
50.0000 mL | Freq: Once | INTRAVENOUS | Status: AC | PRN
  Administered 2017-08-19: 50 mL via INTRAVENOUS

## 2017-08-25 ENCOUNTER — Telehealth (HOSPITAL_COMMUNITY): Payer: Self-pay

## 2017-08-25 NOTE — Telephone Encounter (Signed)
Left message for pt to f/u in 6 months. Will call pt when it is time for next f/u. AW

## 2017-09-06 DIAGNOSIS — Z950 Presence of cardiac pacemaker: Secondary | ICD-10-CM | POA: Diagnosis not present

## 2017-09-06 DIAGNOSIS — E039 Hypothyroidism, unspecified: Secondary | ICD-10-CM | POA: Diagnosis not present

## 2017-09-06 DIAGNOSIS — Z45018 Encounter for adjustment and management of other part of cardiac pacemaker: Secondary | ICD-10-CM | POA: Diagnosis not present

## 2017-09-06 DIAGNOSIS — E782 Mixed hyperlipidemia: Secondary | ICD-10-CM | POA: Diagnosis not present

## 2017-09-06 DIAGNOSIS — I495 Sick sinus syndrome: Secondary | ICD-10-CM | POA: Diagnosis not present

## 2017-09-06 DIAGNOSIS — D500 Iron deficiency anemia secondary to blood loss (chronic): Secondary | ICD-10-CM | POA: Diagnosis not present

## 2017-09-06 DIAGNOSIS — I129 Hypertensive chronic kidney disease with stage 1 through stage 4 chronic kidney disease, or unspecified chronic kidney disease: Secondary | ICD-10-CM | POA: Diagnosis not present

## 2017-09-06 DIAGNOSIS — N183 Chronic kidney disease, stage 3 (moderate): Secondary | ICD-10-CM | POA: Diagnosis not present

## 2017-09-13 DIAGNOSIS — J45991 Cough variant asthma: Secondary | ICD-10-CM | POA: Diagnosis not present

## 2017-09-13 DIAGNOSIS — I69398 Other sequelae of cerebral infarction: Secondary | ICD-10-CM | POA: Diagnosis not present

## 2017-09-13 DIAGNOSIS — I130 Hypertensive heart and chronic kidney disease with heart failure and stage 1 through stage 4 chronic kidney disease, or unspecified chronic kidney disease: Secondary | ICD-10-CM | POA: Diagnosis not present

## 2017-09-13 DIAGNOSIS — F0150 Vascular dementia without behavioral disturbance: Secondary | ICD-10-CM | POA: Diagnosis not present

## 2017-09-17 DIAGNOSIS — H52223 Regular astigmatism, bilateral: Secondary | ICD-10-CM | POA: Diagnosis not present

## 2017-09-17 DIAGNOSIS — H524 Presbyopia: Secondary | ICD-10-CM | POA: Diagnosis not present

## 2017-09-17 DIAGNOSIS — H5213 Myopia, bilateral: Secondary | ICD-10-CM | POA: Diagnosis not present

## 2017-10-03 DIAGNOSIS — R350 Frequency of micturition: Secondary | ICD-10-CM | POA: Diagnosis not present

## 2017-10-14 NOTE — Telephone Encounter (Signed)
Signing past encounter of attempted phone call 

## 2017-10-15 DIAGNOSIS — M25571 Pain in right ankle and joints of right foot: Secondary | ICD-10-CM | POA: Diagnosis not present

## 2017-10-15 DIAGNOSIS — S8991XA Unspecified injury of right lower leg, initial encounter: Secondary | ICD-10-CM | POA: Diagnosis not present

## 2017-10-15 DIAGNOSIS — S99911A Unspecified injury of right ankle, initial encounter: Secondary | ICD-10-CM | POA: Diagnosis not present

## 2017-10-15 DIAGNOSIS — M79671 Pain in right foot: Secondary | ICD-10-CM | POA: Diagnosis not present

## 2017-10-15 DIAGNOSIS — M79604 Pain in right leg: Secondary | ICD-10-CM | POA: Diagnosis not present

## 2017-10-15 DIAGNOSIS — S99921A Unspecified injury of right foot, initial encounter: Secondary | ICD-10-CM | POA: Diagnosis not present

## 2017-10-15 DIAGNOSIS — M25561 Pain in right knee: Secondary | ICD-10-CM | POA: Diagnosis not present

## 2017-10-15 DIAGNOSIS — M79651 Pain in right thigh: Secondary | ICD-10-CM | POA: Diagnosis not present

## 2018-01-02 DIAGNOSIS — Z7901 Long term (current) use of anticoagulants: Secondary | ICD-10-CM | POA: Diagnosis not present

## 2018-01-02 DIAGNOSIS — I491 Atrial premature depolarization: Secondary | ICD-10-CM | POA: Diagnosis not present

## 2018-01-02 DIAGNOSIS — R531 Weakness: Secondary | ICD-10-CM | POA: Diagnosis not present

## 2018-01-02 DIAGNOSIS — R5383 Other fatigue: Secondary | ICD-10-CM | POA: Diagnosis not present

## 2018-01-02 DIAGNOSIS — R9431 Abnormal electrocardiogram [ECG] [EKG]: Secondary | ICD-10-CM | POA: Diagnosis not present

## 2018-01-02 DIAGNOSIS — I5032 Chronic diastolic (congestive) heart failure: Secondary | ICD-10-CM | POA: Diagnosis not present

## 2018-01-02 DIAGNOSIS — I4891 Unspecified atrial fibrillation: Secondary | ICD-10-CM | POA: Diagnosis not present

## 2018-01-02 DIAGNOSIS — I498 Other specified cardiac arrhythmias: Secondary | ICD-10-CM | POA: Diagnosis not present

## 2018-01-03 DIAGNOSIS — I491 Atrial premature depolarization: Secondary | ICD-10-CM | POA: Diagnosis not present

## 2018-01-03 DIAGNOSIS — I459 Conduction disorder, unspecified: Secondary | ICD-10-CM | POA: Diagnosis not present

## 2018-01-04 DIAGNOSIS — I651 Occlusion and stenosis of basilar artery: Secondary | ICD-10-CM | POA: Diagnosis not present

## 2018-01-04 DIAGNOSIS — Z950 Presence of cardiac pacemaker: Secondary | ICD-10-CM | POA: Diagnosis not present

## 2018-01-04 DIAGNOSIS — I129 Hypertensive chronic kidney disease with stage 1 through stage 4 chronic kidney disease, or unspecified chronic kidney disease: Secondary | ICD-10-CM | POA: Diagnosis not present

## 2018-01-04 DIAGNOSIS — E7849 Other hyperlipidemia: Secondary | ICD-10-CM | POA: Diagnosis not present

## 2018-01-04 DIAGNOSIS — N183 Chronic kidney disease, stage 3 (moderate): Secondary | ICD-10-CM | POA: Diagnosis not present

## 2018-01-04 DIAGNOSIS — I480 Paroxysmal atrial fibrillation: Secondary | ICD-10-CM | POA: Diagnosis not present

## 2018-01-04 DIAGNOSIS — D508 Other iron deficiency anemias: Secondary | ICD-10-CM | POA: Diagnosis not present

## 2018-01-04 DIAGNOSIS — Z7901 Long term (current) use of anticoagulants: Secondary | ICD-10-CM | POA: Diagnosis not present

## 2018-01-11 DIAGNOSIS — E7849 Other hyperlipidemia: Secondary | ICD-10-CM | POA: Diagnosis not present

## 2018-01-11 DIAGNOSIS — E039 Hypothyroidism, unspecified: Secondary | ICD-10-CM | POA: Diagnosis not present

## 2018-01-11 DIAGNOSIS — D508 Other iron deficiency anemias: Secondary | ICD-10-CM | POA: Diagnosis not present

## 2018-01-11 DIAGNOSIS — I5032 Chronic diastolic (congestive) heart failure: Secondary | ICD-10-CM | POA: Diagnosis not present

## 2018-01-11 DIAGNOSIS — N183 Chronic kidney disease, stage 3 (moderate): Secondary | ICD-10-CM | POA: Diagnosis not present

## 2018-01-11 DIAGNOSIS — R55 Syncope and collapse: Secondary | ICD-10-CM | POA: Diagnosis not present

## 2018-01-11 DIAGNOSIS — F0150 Vascular dementia without behavioral disturbance: Secondary | ICD-10-CM | POA: Diagnosis not present

## 2018-01-11 DIAGNOSIS — I130 Hypertensive heart and chronic kidney disease with heart failure and stage 1 through stage 4 chronic kidney disease, or unspecified chronic kidney disease: Secondary | ICD-10-CM | POA: Diagnosis not present

## 2018-01-11 DIAGNOSIS — I480 Paroxysmal atrial fibrillation: Secondary | ICD-10-CM | POA: Diagnosis not present

## 2018-02-02 DIAGNOSIS — I480 Paroxysmal atrial fibrillation: Secondary | ICD-10-CM | POA: Diagnosis not present

## 2018-02-02 DIAGNOSIS — I129 Hypertensive chronic kidney disease with stage 1 through stage 4 chronic kidney disease, or unspecified chronic kidney disease: Secondary | ICD-10-CM | POA: Diagnosis not present

## 2018-02-02 DIAGNOSIS — Z950 Presence of cardiac pacemaker: Secondary | ICD-10-CM | POA: Diagnosis not present

## 2018-02-02 DIAGNOSIS — N183 Chronic kidney disease, stage 3 (moderate): Secondary | ICD-10-CM | POA: Diagnosis not present

## 2018-02-02 DIAGNOSIS — I495 Sick sinus syndrome: Secondary | ICD-10-CM | POA: Diagnosis not present

## 2018-02-02 DIAGNOSIS — Z45018 Encounter for adjustment and management of other part of cardiac pacemaker: Secondary | ICD-10-CM | POA: Diagnosis not present

## 2018-02-22 ENCOUNTER — Telehealth (HOSPITAL_COMMUNITY): Payer: Self-pay

## 2018-02-22 NOTE — Telephone Encounter (Signed)
Called to schedule f/u cta, no answer, left vm. AW  

## 2018-03-18 DIAGNOSIS — N3001 Acute cystitis with hematuria: Secondary | ICD-10-CM | POA: Diagnosis not present

## 2018-03-18 DIAGNOSIS — R1030 Lower abdominal pain, unspecified: Secondary | ICD-10-CM | POA: Diagnosis not present

## 2018-04-09 DIAGNOSIS — G8911 Acute pain due to trauma: Secondary | ICD-10-CM | POA: Diagnosis not present

## 2018-04-09 DIAGNOSIS — M545 Low back pain: Secondary | ICD-10-CM | POA: Diagnosis not present

## 2018-04-14 DIAGNOSIS — M4316 Spondylolisthesis, lumbar region: Secondary | ICD-10-CM | POA: Diagnosis not present

## 2018-04-14 DIAGNOSIS — M545 Low back pain: Secondary | ICD-10-CM | POA: Diagnosis not present

## 2018-04-14 DIAGNOSIS — M4124 Other idiopathic scoliosis, thoracic region: Secondary | ICD-10-CM | POA: Diagnosis not present

## 2018-04-14 DIAGNOSIS — Z7901 Long term (current) use of anticoagulants: Secondary | ICD-10-CM | POA: Diagnosis not present

## 2018-04-14 DIAGNOSIS — N183 Chronic kidney disease, stage 3 (moderate): Secondary | ICD-10-CM | POA: Diagnosis not present

## 2018-04-14 DIAGNOSIS — I5032 Chronic diastolic (congestive) heart failure: Secondary | ICD-10-CM | POA: Diagnosis not present

## 2018-04-14 DIAGNOSIS — F3341 Major depressive disorder, recurrent, in partial remission: Secondary | ICD-10-CM | POA: Diagnosis not present

## 2018-04-14 DIAGNOSIS — I480 Paroxysmal atrial fibrillation: Secondary | ICD-10-CM | POA: Diagnosis not present

## 2018-04-14 DIAGNOSIS — M549 Dorsalgia, unspecified: Secondary | ICD-10-CM | POA: Diagnosis not present

## 2018-04-14 DIAGNOSIS — F0150 Vascular dementia without behavioral disturbance: Secondary | ICD-10-CM | POA: Diagnosis not present

## 2018-04-14 DIAGNOSIS — E039 Hypothyroidism, unspecified: Secondary | ICD-10-CM | POA: Diagnosis not present

## 2018-04-14 DIAGNOSIS — S299XXA Unspecified injury of thorax, initial encounter: Secondary | ICD-10-CM | POA: Diagnosis not present

## 2018-04-14 DIAGNOSIS — E559 Vitamin D deficiency, unspecified: Secondary | ICD-10-CM | POA: Diagnosis not present

## 2018-04-14 DIAGNOSIS — M5134 Other intervertebral disc degeneration, thoracic region: Secondary | ICD-10-CM | POA: Diagnosis not present

## 2018-04-14 DIAGNOSIS — D508 Other iron deficiency anemias: Secondary | ICD-10-CM | POA: Diagnosis not present

## 2018-04-14 DIAGNOSIS — I495 Sick sinus syndrome: Secondary | ICD-10-CM | POA: Diagnosis not present

## 2018-04-14 DIAGNOSIS — Z79899 Other long term (current) drug therapy: Secondary | ICD-10-CM | POA: Diagnosis not present

## 2018-04-14 DIAGNOSIS — M5136 Other intervertebral disc degeneration, lumbar region: Secondary | ICD-10-CM | POA: Diagnosis not present

## 2018-04-14 DIAGNOSIS — E7849 Other hyperlipidemia: Secondary | ICD-10-CM | POA: Diagnosis not present

## 2018-04-14 DIAGNOSIS — I130 Hypertensive heart and chronic kidney disease with heart failure and stage 1 through stage 4 chronic kidney disease, or unspecified chronic kidney disease: Secondary | ICD-10-CM | POA: Diagnosis not present

## 2018-04-14 DIAGNOSIS — I129 Hypertensive chronic kidney disease with stage 1 through stage 4 chronic kidney disease, or unspecified chronic kidney disease: Secondary | ICD-10-CM | POA: Diagnosis not present

## 2018-05-02 ENCOUNTER — Telehealth (HOSPITAL_COMMUNITY): Payer: Self-pay

## 2018-05-02 DIAGNOSIS — Z0000 Encounter for general adult medical examination without abnormal findings: Secondary | ICD-10-CM | POA: Diagnosis not present

## 2018-05-02 DIAGNOSIS — M5442 Lumbago with sciatica, left side: Secondary | ICD-10-CM | POA: Diagnosis not present

## 2018-05-02 DIAGNOSIS — I5032 Chronic diastolic (congestive) heart failure: Secondary | ICD-10-CM | POA: Diagnosis not present

## 2018-05-02 DIAGNOSIS — I130 Hypertensive heart and chronic kidney disease with heart failure and stage 1 through stage 4 chronic kidney disease, or unspecified chronic kidney disease: Secondary | ICD-10-CM | POA: Diagnosis not present

## 2018-05-02 DIAGNOSIS — I480 Paroxysmal atrial fibrillation: Secondary | ICD-10-CM | POA: Diagnosis not present

## 2018-05-02 DIAGNOSIS — I495 Sick sinus syndrome: Secondary | ICD-10-CM | POA: Diagnosis not present

## 2018-05-02 DIAGNOSIS — F0150 Vascular dementia without behavioral disturbance: Secondary | ICD-10-CM | POA: Diagnosis not present

## 2018-05-02 DIAGNOSIS — E039 Hypothyroidism, unspecified: Secondary | ICD-10-CM | POA: Diagnosis not present

## 2018-05-02 DIAGNOSIS — G8929 Other chronic pain: Secondary | ICD-10-CM | POA: Diagnosis not present

## 2018-05-02 DIAGNOSIS — N183 Chronic kidney disease, stage 3 (moderate): Secondary | ICD-10-CM | POA: Diagnosis not present

## 2018-05-02 NOTE — Telephone Encounter (Signed)
Called to schedule f/u cta head/neck, no answer, no vm. AW 

## 2018-05-07 DIAGNOSIS — M5442 Lumbago with sciatica, left side: Secondary | ICD-10-CM | POA: Diagnosis not present

## 2018-05-07 DIAGNOSIS — Z7901 Long term (current) use of anticoagulants: Secondary | ICD-10-CM | POA: Diagnosis not present

## 2018-05-07 DIAGNOSIS — I5032 Chronic diastolic (congestive) heart failure: Secondary | ICD-10-CM | POA: Diagnosis not present

## 2018-05-07 DIAGNOSIS — Z950 Presence of cardiac pacemaker: Secondary | ICD-10-CM | POA: Diagnosis not present

## 2018-05-07 DIAGNOSIS — R42 Dizziness and giddiness: Secondary | ICD-10-CM | POA: Diagnosis not present

## 2018-05-07 DIAGNOSIS — I480 Paroxysmal atrial fibrillation: Secondary | ICD-10-CM | POA: Diagnosis not present

## 2018-05-07 DIAGNOSIS — I495 Sick sinus syndrome: Secondary | ICD-10-CM | POA: Diagnosis not present

## 2018-05-07 DIAGNOSIS — G8929 Other chronic pain: Secondary | ICD-10-CM | POA: Diagnosis not present

## 2018-05-07 DIAGNOSIS — N183 Chronic kidney disease, stage 3 (moderate): Secondary | ICD-10-CM | POA: Diagnosis not present

## 2018-05-07 DIAGNOSIS — F0150 Vascular dementia without behavioral disturbance: Secondary | ICD-10-CM | POA: Diagnosis not present

## 2018-05-07 DIAGNOSIS — F329 Major depressive disorder, single episode, unspecified: Secondary | ICD-10-CM | POA: Diagnosis not present

## 2018-05-07 DIAGNOSIS — Z7982 Long term (current) use of aspirin: Secondary | ICD-10-CM | POA: Diagnosis not present

## 2018-05-07 DIAGNOSIS — I130 Hypertensive heart and chronic kidney disease with heart failure and stage 1 through stage 4 chronic kidney disease, or unspecified chronic kidney disease: Secondary | ICD-10-CM | POA: Diagnosis not present

## 2018-05-10 DIAGNOSIS — Z7982 Long term (current) use of aspirin: Secondary | ICD-10-CM | POA: Diagnosis not present

## 2018-05-10 DIAGNOSIS — Z7901 Long term (current) use of anticoagulants: Secondary | ICD-10-CM | POA: Diagnosis not present

## 2018-05-10 DIAGNOSIS — F0150 Vascular dementia without behavioral disturbance: Secondary | ICD-10-CM | POA: Diagnosis not present

## 2018-05-10 DIAGNOSIS — N183 Chronic kidney disease, stage 3 (moderate): Secondary | ICD-10-CM | POA: Diagnosis not present

## 2018-05-10 DIAGNOSIS — G8929 Other chronic pain: Secondary | ICD-10-CM | POA: Diagnosis not present

## 2018-05-10 DIAGNOSIS — M5442 Lumbago with sciatica, left side: Secondary | ICD-10-CM | POA: Diagnosis not present

## 2018-05-10 DIAGNOSIS — I495 Sick sinus syndrome: Secondary | ICD-10-CM | POA: Diagnosis not present

## 2018-05-10 DIAGNOSIS — I5032 Chronic diastolic (congestive) heart failure: Secondary | ICD-10-CM | POA: Diagnosis not present

## 2018-05-10 DIAGNOSIS — Z950 Presence of cardiac pacemaker: Secondary | ICD-10-CM | POA: Diagnosis not present

## 2018-05-10 DIAGNOSIS — I480 Paroxysmal atrial fibrillation: Secondary | ICD-10-CM | POA: Diagnosis not present

## 2018-05-10 DIAGNOSIS — I130 Hypertensive heart and chronic kidney disease with heart failure and stage 1 through stage 4 chronic kidney disease, or unspecified chronic kidney disease: Secondary | ICD-10-CM | POA: Diagnosis not present

## 2018-05-10 DIAGNOSIS — R42 Dizziness and giddiness: Secondary | ICD-10-CM | POA: Diagnosis not present

## 2018-05-10 DIAGNOSIS — F329 Major depressive disorder, single episode, unspecified: Secondary | ICD-10-CM | POA: Diagnosis not present

## 2018-05-19 DIAGNOSIS — R32 Unspecified urinary incontinence: Secondary | ICD-10-CM | POA: Diagnosis not present

## 2018-05-19 DIAGNOSIS — I651 Occlusion and stenosis of basilar artery: Secondary | ICD-10-CM | POA: Diagnosis not present

## 2018-05-19 DIAGNOSIS — R269 Unspecified abnormalities of gait and mobility: Secondary | ICD-10-CM | POA: Diagnosis not present

## 2018-05-26 DIAGNOSIS — E039 Hypothyroidism, unspecified: Secondary | ICD-10-CM | POA: Diagnosis not present

## 2018-05-26 DIAGNOSIS — N183 Chronic kidney disease, stage 3 (moderate): Secondary | ICD-10-CM | POA: Diagnosis not present

## 2018-05-26 DIAGNOSIS — I651 Occlusion and stenosis of basilar artery: Secondary | ICD-10-CM | POA: Diagnosis not present

## 2018-05-26 DIAGNOSIS — I129 Hypertensive chronic kidney disease with stage 1 through stage 4 chronic kidney disease, or unspecified chronic kidney disease: Secondary | ICD-10-CM | POA: Diagnosis not present

## 2018-05-26 DIAGNOSIS — Z45018 Encounter for adjustment and management of other part of cardiac pacemaker: Secondary | ICD-10-CM | POA: Diagnosis not present

## 2018-05-26 DIAGNOSIS — E785 Hyperlipidemia, unspecified: Secondary | ICD-10-CM | POA: Diagnosis not present

## 2018-05-26 DIAGNOSIS — I5032 Chronic diastolic (congestive) heart failure: Secondary | ICD-10-CM | POA: Diagnosis not present

## 2018-05-26 DIAGNOSIS — I480 Paroxysmal atrial fibrillation: Secondary | ICD-10-CM | POA: Diagnosis not present

## 2018-05-30 DIAGNOSIS — I651 Occlusion and stenosis of basilar artery: Secondary | ICD-10-CM | POA: Diagnosis not present

## 2018-05-30 DIAGNOSIS — R269 Unspecified abnormalities of gait and mobility: Secondary | ICD-10-CM | POA: Diagnosis not present

## 2018-05-30 DIAGNOSIS — R32 Unspecified urinary incontinence: Secondary | ICD-10-CM | POA: Diagnosis not present

## 2018-06-08 DIAGNOSIS — I130 Hypertensive heart and chronic kidney disease with heart failure and stage 1 through stage 4 chronic kidney disease, or unspecified chronic kidney disease: Secondary | ICD-10-CM | POA: Diagnosis not present

## 2018-06-08 DIAGNOSIS — I480 Paroxysmal atrial fibrillation: Secondary | ICD-10-CM | POA: Diagnosis not present

## 2018-06-08 DIAGNOSIS — F0150 Vascular dementia without behavioral disturbance: Secondary | ICD-10-CM | POA: Diagnosis not present

## 2018-06-08 DIAGNOSIS — F329 Major depressive disorder, single episode, unspecified: Secondary | ICD-10-CM | POA: Diagnosis not present

## 2018-06-08 DIAGNOSIS — R42 Dizziness and giddiness: Secondary | ICD-10-CM | POA: Diagnosis not present

## 2018-06-08 DIAGNOSIS — G8929 Other chronic pain: Secondary | ICD-10-CM | POA: Diagnosis not present

## 2018-06-08 DIAGNOSIS — I5032 Chronic diastolic (congestive) heart failure: Secondary | ICD-10-CM | POA: Diagnosis not present

## 2018-06-08 DIAGNOSIS — I495 Sick sinus syndrome: Secondary | ICD-10-CM | POA: Diagnosis not present

## 2018-06-08 DIAGNOSIS — Z950 Presence of cardiac pacemaker: Secondary | ICD-10-CM | POA: Diagnosis not present

## 2018-06-08 DIAGNOSIS — Z7982 Long term (current) use of aspirin: Secondary | ICD-10-CM | POA: Diagnosis not present

## 2018-06-08 DIAGNOSIS — Z7901 Long term (current) use of anticoagulants: Secondary | ICD-10-CM | POA: Diagnosis not present

## 2018-06-08 DIAGNOSIS — N183 Chronic kidney disease, stage 3 (moderate): Secondary | ICD-10-CM | POA: Diagnosis not present

## 2018-06-08 DIAGNOSIS — M5442 Lumbago with sciatica, left side: Secondary | ICD-10-CM | POA: Diagnosis not present

## 2018-06-10 DIAGNOSIS — Z950 Presence of cardiac pacemaker: Secondary | ICD-10-CM | POA: Diagnosis not present

## 2018-06-10 DIAGNOSIS — I081 Rheumatic disorders of both mitral and tricuspid valves: Secondary | ICD-10-CM | POA: Diagnosis not present

## 2018-06-15 DIAGNOSIS — R1030 Lower abdominal pain, unspecified: Secondary | ICD-10-CM | POA: Diagnosis not present

## 2018-06-15 DIAGNOSIS — N3000 Acute cystitis without hematuria: Secondary | ICD-10-CM | POA: Diagnosis not present

## 2018-07-14 DIAGNOSIS — I495 Sick sinus syndrome: Secondary | ICD-10-CM | POA: Diagnosis not present

## 2018-07-14 DIAGNOSIS — G8929 Other chronic pain: Secondary | ICD-10-CM | POA: Diagnosis not present

## 2018-07-14 DIAGNOSIS — D631 Anemia in chronic kidney disease: Secondary | ICD-10-CM | POA: Diagnosis not present

## 2018-07-14 DIAGNOSIS — M5442 Lumbago with sciatica, left side: Secondary | ICD-10-CM | POA: Diagnosis not present

## 2018-07-14 DIAGNOSIS — E039 Hypothyroidism, unspecified: Secondary | ICD-10-CM | POA: Diagnosis not present

## 2018-07-14 DIAGNOSIS — F0150 Vascular dementia without behavioral disturbance: Secondary | ICD-10-CM | POA: Diagnosis not present

## 2018-07-14 DIAGNOSIS — I5032 Chronic diastolic (congestive) heart failure: Secondary | ICD-10-CM | POA: Diagnosis not present

## 2018-07-14 DIAGNOSIS — D509 Iron deficiency anemia, unspecified: Secondary | ICD-10-CM | POA: Diagnosis not present

## 2018-07-14 DIAGNOSIS — I480 Paroxysmal atrial fibrillation: Secondary | ICD-10-CM | POA: Diagnosis not present

## 2018-07-14 DIAGNOSIS — M1711 Unilateral primary osteoarthritis, right knee: Secondary | ICD-10-CM | POA: Diagnosis not present

## 2018-07-14 DIAGNOSIS — I130 Hypertensive heart and chronic kidney disease with heart failure and stage 1 through stage 4 chronic kidney disease, or unspecified chronic kidney disease: Secondary | ICD-10-CM | POA: Diagnosis not present

## 2018-07-14 DIAGNOSIS — N183 Chronic kidney disease, stage 3 (moderate): Secondary | ICD-10-CM | POA: Diagnosis not present

## 2018-07-25 DIAGNOSIS — I130 Hypertensive heart and chronic kidney disease with heart failure and stage 1 through stage 4 chronic kidney disease, or unspecified chronic kidney disease: Secondary | ICD-10-CM | POA: Diagnosis not present

## 2018-07-25 DIAGNOSIS — I5032 Chronic diastolic (congestive) heart failure: Secondary | ICD-10-CM | POA: Diagnosis not present

## 2018-07-25 DIAGNOSIS — G8929 Other chronic pain: Secondary | ICD-10-CM | POA: Diagnosis not present

## 2018-07-25 DIAGNOSIS — M5442 Lumbago with sciatica, left side: Secondary | ICD-10-CM | POA: Diagnosis not present

## 2018-07-25 DIAGNOSIS — N183 Chronic kidney disease, stage 3 (moderate): Secondary | ICD-10-CM | POA: Diagnosis not present

## 2018-08-01 DIAGNOSIS — I129 Hypertensive chronic kidney disease with stage 1 through stage 4 chronic kidney disease, or unspecified chronic kidney disease: Secondary | ICD-10-CM | POA: Diagnosis not present

## 2018-08-01 DIAGNOSIS — I5032 Chronic diastolic (congestive) heart failure: Secondary | ICD-10-CM | POA: Diagnosis not present

## 2018-08-01 DIAGNOSIS — G2581 Restless legs syndrome: Secondary | ICD-10-CM | POA: Diagnosis not present

## 2018-08-01 DIAGNOSIS — E039 Hypothyroidism, unspecified: Secondary | ICD-10-CM | POA: Diagnosis not present

## 2018-08-01 DIAGNOSIS — D508 Other iron deficiency anemias: Secondary | ICD-10-CM | POA: Diagnosis not present

## 2018-08-01 DIAGNOSIS — I495 Sick sinus syndrome: Secondary | ICD-10-CM | POA: Diagnosis not present

## 2018-08-01 DIAGNOSIS — M1711 Unilateral primary osteoarthritis, right knee: Secondary | ICD-10-CM | POA: Diagnosis not present

## 2018-08-01 DIAGNOSIS — I480 Paroxysmal atrial fibrillation: Secondary | ICD-10-CM | POA: Diagnosis not present

## 2018-08-01 DIAGNOSIS — E559 Vitamin D deficiency, unspecified: Secondary | ICD-10-CM | POA: Diagnosis not present

## 2018-08-01 DIAGNOSIS — F3341 Major depressive disorder, recurrent, in partial remission: Secondary | ICD-10-CM | POA: Diagnosis not present

## 2018-08-01 DIAGNOSIS — E785 Hyperlipidemia, unspecified: Secondary | ICD-10-CM | POA: Diagnosis not present

## 2018-08-01 DIAGNOSIS — F0150 Vascular dementia without behavioral disturbance: Secondary | ICD-10-CM | POA: Diagnosis not present

## 2018-08-05 DIAGNOSIS — I5032 Chronic diastolic (congestive) heart failure: Secondary | ICD-10-CM | POA: Diagnosis not present

## 2018-08-05 DIAGNOSIS — I129 Hypertensive chronic kidney disease with stage 1 through stage 4 chronic kidney disease, or unspecified chronic kidney disease: Secondary | ICD-10-CM | POA: Diagnosis not present

## 2018-08-05 DIAGNOSIS — I495 Sick sinus syndrome: Secondary | ICD-10-CM | POA: Diagnosis not present

## 2018-08-05 DIAGNOSIS — Z45018 Encounter for adjustment and management of other part of cardiac pacemaker: Secondary | ICD-10-CM | POA: Diagnosis not present

## 2018-08-05 DIAGNOSIS — N183 Chronic kidney disease, stage 3 (moderate): Secondary | ICD-10-CM | POA: Diagnosis not present

## 2018-08-08 DIAGNOSIS — I471 Supraventricular tachycardia: Secondary | ICD-10-CM | POA: Diagnosis not present

## 2018-08-08 DIAGNOSIS — D509 Iron deficiency anemia, unspecified: Secondary | ICD-10-CM | POA: Diagnosis not present

## 2018-08-08 DIAGNOSIS — E7800 Pure hypercholesterolemia, unspecified: Secondary | ICD-10-CM | POA: Diagnosis not present

## 2018-08-08 DIAGNOSIS — E876 Hypokalemia: Secondary | ICD-10-CM | POA: Diagnosis not present

## 2018-08-08 DIAGNOSIS — F0150 Vascular dementia without behavioral disturbance: Secondary | ICD-10-CM | POA: Diagnosis not present

## 2018-08-08 DIAGNOSIS — I5032 Chronic diastolic (congestive) heart failure: Secondary | ICD-10-CM | POA: Diagnosis not present

## 2018-08-08 DIAGNOSIS — N3941 Urge incontinence: Secondary | ICD-10-CM | POA: Diagnosis not present

## 2018-08-08 DIAGNOSIS — M1711 Unilateral primary osteoarthritis, right knee: Secondary | ICD-10-CM | POA: Diagnosis not present

## 2018-08-08 DIAGNOSIS — E039 Hypothyroidism, unspecified: Secondary | ICD-10-CM | POA: Diagnosis not present

## 2018-08-08 DIAGNOSIS — J45909 Unspecified asthma, uncomplicated: Secondary | ICD-10-CM | POA: Diagnosis not present

## 2018-08-08 DIAGNOSIS — Z9861 Coronary angioplasty status: Secondary | ICD-10-CM | POA: Diagnosis not present

## 2018-08-08 DIAGNOSIS — N183 Chronic kidney disease, stage 3 (moderate): Secondary | ICD-10-CM | POA: Diagnosis not present

## 2018-08-08 DIAGNOSIS — K812 Acute cholecystitis with chronic cholecystitis: Secondary | ICD-10-CM | POA: Diagnosis not present

## 2018-08-08 DIAGNOSIS — I495 Sick sinus syndrome: Secondary | ICD-10-CM | POA: Diagnosis not present

## 2018-08-08 DIAGNOSIS — G8929 Other chronic pain: Secondary | ICD-10-CM | POA: Diagnosis not present

## 2018-08-08 DIAGNOSIS — E0590 Thyrotoxicosis, unspecified without thyrotoxic crisis or storm: Secondary | ICD-10-CM | POA: Diagnosis not present

## 2018-08-08 DIAGNOSIS — Z90710 Acquired absence of both cervix and uterus: Secondary | ICD-10-CM | POA: Diagnosis not present

## 2018-08-08 DIAGNOSIS — I130 Hypertensive heart and chronic kidney disease with heart failure and stage 1 through stage 4 chronic kidney disease, or unspecified chronic kidney disease: Secondary | ICD-10-CM | POA: Diagnosis not present

## 2018-08-08 DIAGNOSIS — K5900 Constipation, unspecified: Secondary | ICD-10-CM | POA: Diagnosis not present

## 2018-08-08 DIAGNOSIS — F3341 Major depressive disorder, recurrent, in partial remission: Secondary | ICD-10-CM | POA: Diagnosis not present

## 2018-08-08 DIAGNOSIS — D631 Anemia in chronic kidney disease: Secondary | ICD-10-CM | POA: Diagnosis not present

## 2018-08-08 DIAGNOSIS — M5442 Lumbago with sciatica, left side: Secondary | ICD-10-CM | POA: Diagnosis not present

## 2018-08-08 DIAGNOSIS — M8589 Other specified disorders of bone density and structure, multiple sites: Secondary | ICD-10-CM | POA: Diagnosis not present

## 2018-08-08 DIAGNOSIS — I480 Paroxysmal atrial fibrillation: Secondary | ICD-10-CM | POA: Diagnosis not present

## 2018-08-08 DIAGNOSIS — G2581 Restless legs syndrome: Secondary | ICD-10-CM | POA: Diagnosis not present

## 2018-08-15 DIAGNOSIS — R300 Dysuria: Secondary | ICD-10-CM | POA: Diagnosis not present

## 2018-08-22 DIAGNOSIS — I495 Sick sinus syndrome: Secondary | ICD-10-CM | POA: Diagnosis not present

## 2018-08-22 DIAGNOSIS — Z9861 Coronary angioplasty status: Secondary | ICD-10-CM | POA: Diagnosis not present

## 2018-08-22 DIAGNOSIS — I5032 Chronic diastolic (congestive) heart failure: Secondary | ICD-10-CM | POA: Diagnosis not present

## 2018-08-22 DIAGNOSIS — F3341 Major depressive disorder, recurrent, in partial remission: Secondary | ICD-10-CM | POA: Diagnosis not present

## 2018-08-22 DIAGNOSIS — D509 Iron deficiency anemia, unspecified: Secondary | ICD-10-CM | POA: Diagnosis not present

## 2018-08-22 DIAGNOSIS — M8589 Other specified disorders of bone density and structure, multiple sites: Secondary | ICD-10-CM | POA: Diagnosis not present

## 2018-08-22 DIAGNOSIS — F0150 Vascular dementia without behavioral disturbance: Secondary | ICD-10-CM | POA: Diagnosis not present

## 2018-08-22 DIAGNOSIS — D631 Anemia in chronic kidney disease: Secondary | ICD-10-CM | POA: Diagnosis not present

## 2018-08-22 DIAGNOSIS — E7800 Pure hypercholesterolemia, unspecified: Secondary | ICD-10-CM | POA: Diagnosis not present

## 2018-08-22 DIAGNOSIS — M1711 Unilateral primary osteoarthritis, right knee: Secondary | ICD-10-CM | POA: Diagnosis not present

## 2018-08-22 DIAGNOSIS — G8929 Other chronic pain: Secondary | ICD-10-CM | POA: Diagnosis not present

## 2018-08-22 DIAGNOSIS — E876 Hypokalemia: Secondary | ICD-10-CM | POA: Diagnosis not present

## 2018-08-22 DIAGNOSIS — J45909 Unspecified asthma, uncomplicated: Secondary | ICD-10-CM | POA: Diagnosis not present

## 2018-08-22 DIAGNOSIS — I130 Hypertensive heart and chronic kidney disease with heart failure and stage 1 through stage 4 chronic kidney disease, or unspecified chronic kidney disease: Secondary | ICD-10-CM | POA: Diagnosis not present

## 2018-08-22 DIAGNOSIS — I471 Supraventricular tachycardia: Secondary | ICD-10-CM | POA: Diagnosis not present

## 2018-08-22 DIAGNOSIS — K5900 Constipation, unspecified: Secondary | ICD-10-CM | POA: Diagnosis not present

## 2018-08-22 DIAGNOSIS — N183 Chronic kidney disease, stage 3 (moderate): Secondary | ICD-10-CM | POA: Diagnosis not present

## 2018-08-22 DIAGNOSIS — I480 Paroxysmal atrial fibrillation: Secondary | ICD-10-CM | POA: Diagnosis not present

## 2018-08-22 DIAGNOSIS — M5442 Lumbago with sciatica, left side: Secondary | ICD-10-CM | POA: Diagnosis not present

## 2018-08-22 DIAGNOSIS — G2581 Restless legs syndrome: Secondary | ICD-10-CM | POA: Diagnosis not present

## 2018-08-22 DIAGNOSIS — N3941 Urge incontinence: Secondary | ICD-10-CM | POA: Diagnosis not present

## 2018-08-22 DIAGNOSIS — E0590 Thyrotoxicosis, unspecified without thyrotoxic crisis or storm: Secondary | ICD-10-CM | POA: Diagnosis not present

## 2018-08-22 DIAGNOSIS — E039 Hypothyroidism, unspecified: Secondary | ICD-10-CM | POA: Diagnosis not present

## 2018-08-22 DIAGNOSIS — K812 Acute cholecystitis with chronic cholecystitis: Secondary | ICD-10-CM | POA: Diagnosis not present

## 2018-08-22 DIAGNOSIS — Z90710 Acquired absence of both cervix and uterus: Secondary | ICD-10-CM | POA: Diagnosis not present

## 2018-08-23 DIAGNOSIS — H6122 Impacted cerumen, left ear: Secondary | ICD-10-CM | POA: Diagnosis not present

## 2018-08-23 DIAGNOSIS — Z7901 Long term (current) use of anticoagulants: Secondary | ICD-10-CM | POA: Diagnosis not present

## 2018-08-23 DIAGNOSIS — W19XXXA Unspecified fall, initial encounter: Secondary | ICD-10-CM | POA: Diagnosis not present

## 2018-08-23 DIAGNOSIS — R42 Dizziness and giddiness: Secondary | ICD-10-CM | POA: Diagnosis not present

## 2018-08-23 DIAGNOSIS — M62838 Other muscle spasm: Secondary | ICD-10-CM | POA: Diagnosis not present

## 2018-08-23 DIAGNOSIS — I651 Occlusion and stenosis of basilar artery: Secondary | ICD-10-CM | POA: Diagnosis not present

## 2018-09-29 DIAGNOSIS — H53483 Generalized contraction of visual field, bilateral: Secondary | ICD-10-CM | POA: Diagnosis not present

## 2018-09-29 DIAGNOSIS — H33199 Other retinoschisis and retinal cysts, unspecified eye: Secondary | ICD-10-CM | POA: Diagnosis not present

## 2018-09-29 DIAGNOSIS — H52223 Regular astigmatism, bilateral: Secondary | ICD-10-CM | POA: Diagnosis not present

## 2018-09-29 DIAGNOSIS — H04123 Dry eye syndrome of bilateral lacrimal glands: Secondary | ICD-10-CM | POA: Diagnosis not present

## 2018-09-29 DIAGNOSIS — H35033 Hypertensive retinopathy, bilateral: Secondary | ICD-10-CM | POA: Diagnosis not present

## 2018-09-29 DIAGNOSIS — H524 Presbyopia: Secondary | ICD-10-CM | POA: Diagnosis not present

## 2018-10-04 ENCOUNTER — Telehealth (HOSPITAL_COMMUNITY): Payer: Self-pay

## 2018-10-04 NOTE — Telephone Encounter (Signed)
Called to schedule f/u cta head/neck, no answer, no vm. AW 

## 2018-10-13 DIAGNOSIS — I129 Hypertensive chronic kidney disease with stage 1 through stage 4 chronic kidney disease, or unspecified chronic kidney disease: Secondary | ICD-10-CM | POA: Diagnosis not present

## 2018-10-13 DIAGNOSIS — I495 Sick sinus syndrome: Secondary | ICD-10-CM | POA: Diagnosis not present

## 2018-10-13 DIAGNOSIS — R296 Repeated falls: Secondary | ICD-10-CM | POA: Diagnosis not present

## 2018-10-13 DIAGNOSIS — E782 Mixed hyperlipidemia: Secondary | ICD-10-CM | POA: Diagnosis not present

## 2018-10-13 DIAGNOSIS — F0150 Vascular dementia without behavioral disturbance: Secondary | ICD-10-CM | POA: Diagnosis not present

## 2018-10-13 DIAGNOSIS — M8589 Other specified disorders of bone density and structure, multiple sites: Secondary | ICD-10-CM | POA: Diagnosis not present

## 2018-10-13 DIAGNOSIS — E032 Hypothyroidism due to medicaments and other exogenous substances: Secondary | ICD-10-CM | POA: Diagnosis not present

## 2018-10-13 DIAGNOSIS — I5032 Chronic diastolic (congestive) heart failure: Secondary | ICD-10-CM | POA: Diagnosis not present

## 2018-10-13 DIAGNOSIS — H8113 Benign paroxysmal vertigo, bilateral: Secondary | ICD-10-CM | POA: Diagnosis not present

## 2018-10-13 DIAGNOSIS — N183 Chronic kidney disease, stage 3 (moderate): Secondary | ICD-10-CM | POA: Diagnosis not present

## 2018-10-13 DIAGNOSIS — I480 Paroxysmal atrial fibrillation: Secondary | ICD-10-CM | POA: Diagnosis not present

## 2018-10-13 DIAGNOSIS — H6121 Impacted cerumen, right ear: Secondary | ICD-10-CM | POA: Diagnosis not present

## 2018-10-13 DIAGNOSIS — F3341 Major depressive disorder, recurrent, in partial remission: Secondary | ICD-10-CM | POA: Diagnosis not present

## 2018-10-19 DIAGNOSIS — R202 Paresthesia of skin: Secondary | ICD-10-CM | POA: Diagnosis not present

## 2018-10-19 DIAGNOSIS — I651 Occlusion and stenosis of basilar artery: Secondary | ICD-10-CM | POA: Diagnosis not present

## 2018-10-19 DIAGNOSIS — R413 Other amnesia: Secondary | ICD-10-CM | POA: Diagnosis not present

## 2018-10-19 DIAGNOSIS — R269 Unspecified abnormalities of gait and mobility: Secondary | ICD-10-CM | POA: Diagnosis not present

## 2018-10-19 DIAGNOSIS — R296 Repeated falls: Secondary | ICD-10-CM | POA: Diagnosis not present

## 2018-10-19 DIAGNOSIS — Z87440 Personal history of urinary (tract) infections: Secondary | ICD-10-CM | POA: Diagnosis not present

## 2018-11-07 DIAGNOSIS — I651 Occlusion and stenosis of basilar artery: Secondary | ICD-10-CM | POA: Diagnosis not present

## 2018-11-07 DIAGNOSIS — G459 Transient cerebral ischemic attack, unspecified: Secondary | ICD-10-CM | POA: Diagnosis not present

## 2018-11-07 DIAGNOSIS — Z950 Presence of cardiac pacemaker: Secondary | ICD-10-CM | POA: Diagnosis not present

## 2018-11-08 DIAGNOSIS — R269 Unspecified abnormalities of gait and mobility: Secondary | ICD-10-CM | POA: Diagnosis not present

## 2018-11-08 DIAGNOSIS — F0150 Vascular dementia without behavioral disturbance: Secondary | ICD-10-CM | POA: Diagnosis not present

## 2018-11-08 DIAGNOSIS — R32 Unspecified urinary incontinence: Secondary | ICD-10-CM | POA: Diagnosis not present

## 2018-11-08 DIAGNOSIS — I651 Occlusion and stenosis of basilar artery: Secondary | ICD-10-CM | POA: Diagnosis not present

## 2018-11-10 DIAGNOSIS — E782 Mixed hyperlipidemia: Secondary | ICD-10-CM | POA: Diagnosis not present

## 2018-11-10 DIAGNOSIS — F3341 Major depressive disorder, recurrent, in partial remission: Secondary | ICD-10-CM | POA: Diagnosis not present

## 2018-11-10 DIAGNOSIS — E032 Hypothyroidism due to medicaments and other exogenous substances: Secondary | ICD-10-CM | POA: Diagnosis not present

## 2018-11-10 DIAGNOSIS — R296 Repeated falls: Secondary | ICD-10-CM | POA: Diagnosis not present

## 2018-11-10 DIAGNOSIS — Z87440 Personal history of urinary (tract) infections: Secondary | ICD-10-CM | POA: Diagnosis not present

## 2018-11-10 DIAGNOSIS — Z950 Presence of cardiac pacemaker: Secondary | ICD-10-CM | POA: Diagnosis not present

## 2018-11-10 DIAGNOSIS — T466X5A Adverse effect of antihyperlipidemic and antiarteriosclerotic drugs, initial encounter: Secondary | ICD-10-CM | POA: Diagnosis not present

## 2018-11-10 DIAGNOSIS — H8113 Benign paroxysmal vertigo, bilateral: Secondary | ICD-10-CM | POA: Diagnosis not present

## 2018-11-10 DIAGNOSIS — F0150 Vascular dementia without behavioral disturbance: Secondary | ICD-10-CM | POA: Diagnosis not present

## 2018-11-10 DIAGNOSIS — G720 Drug-induced myopathy: Secondary | ICD-10-CM | POA: Diagnosis not present

## 2018-11-10 DIAGNOSIS — I480 Paroxysmal atrial fibrillation: Secondary | ICD-10-CM | POA: Diagnosis not present

## 2018-11-10 DIAGNOSIS — I129 Hypertensive chronic kidney disease with stage 1 through stage 4 chronic kidney disease, or unspecified chronic kidney disease: Secondary | ICD-10-CM | POA: Diagnosis not present

## 2018-11-10 DIAGNOSIS — I5032 Chronic diastolic (congestive) heart failure: Secondary | ICD-10-CM | POA: Diagnosis not present

## 2018-11-11 DIAGNOSIS — Z79899 Other long term (current) drug therapy: Secondary | ICD-10-CM | POA: Diagnosis not present

## 2018-11-11 DIAGNOSIS — E039 Hypothyroidism, unspecified: Secondary | ICD-10-CM | POA: Diagnosis not present

## 2018-11-11 DIAGNOSIS — Z87440 Personal history of urinary (tract) infections: Secondary | ICD-10-CM | POA: Diagnosis not present

## 2018-11-15 DIAGNOSIS — N329 Bladder disorder, unspecified: Secondary | ICD-10-CM | POA: Diagnosis not present

## 2018-11-15 DIAGNOSIS — N3941 Urge incontinence: Secondary | ICD-10-CM | POA: Diagnosis not present

## 2018-11-16 DIAGNOSIS — E559 Vitamin D deficiency, unspecified: Secondary | ICD-10-CM | POA: Diagnosis not present

## 2018-11-16 DIAGNOSIS — D631 Anemia in chronic kidney disease: Secondary | ICD-10-CM | POA: Diagnosis not present

## 2018-11-16 DIAGNOSIS — G609 Hereditary and idiopathic neuropathy, unspecified: Secondary | ICD-10-CM | POA: Diagnosis not present

## 2018-11-16 DIAGNOSIS — J45909 Unspecified asthma, uncomplicated: Secondary | ICD-10-CM | POA: Diagnosis not present

## 2018-11-16 DIAGNOSIS — M170 Bilateral primary osteoarthritis of knee: Secondary | ICD-10-CM | POA: Diagnosis not present

## 2018-11-16 DIAGNOSIS — I130 Hypertensive heart and chronic kidney disease with heart failure and stage 1 through stage 4 chronic kidney disease, or unspecified chronic kidney disease: Secondary | ICD-10-CM | POA: Diagnosis not present

## 2018-11-16 DIAGNOSIS — I480 Paroxysmal atrial fibrillation: Secondary | ICD-10-CM | POA: Diagnosis not present

## 2018-11-16 DIAGNOSIS — G8929 Other chronic pain: Secondary | ICD-10-CM | POA: Diagnosis not present

## 2018-11-16 DIAGNOSIS — I471 Supraventricular tachycardia: Secondary | ICD-10-CM | POA: Diagnosis not present

## 2018-11-16 DIAGNOSIS — F0150 Vascular dementia without behavioral disturbance: Secondary | ICD-10-CM | POA: Diagnosis not present

## 2018-11-16 DIAGNOSIS — D509 Iron deficiency anemia, unspecified: Secondary | ICD-10-CM | POA: Diagnosis not present

## 2018-11-16 DIAGNOSIS — F3341 Major depressive disorder, recurrent, in partial remission: Secondary | ICD-10-CM | POA: Diagnosis not present

## 2018-11-16 DIAGNOSIS — M160 Bilateral primary osteoarthritis of hip: Secondary | ICD-10-CM | POA: Diagnosis not present

## 2018-11-16 DIAGNOSIS — G2581 Restless legs syndrome: Secondary | ICD-10-CM | POA: Diagnosis not present

## 2018-11-16 DIAGNOSIS — M5442 Lumbago with sciatica, left side: Secondary | ICD-10-CM | POA: Diagnosis not present

## 2018-11-16 DIAGNOSIS — I495 Sick sinus syndrome: Secondary | ICD-10-CM | POA: Diagnosis not present

## 2018-11-16 DIAGNOSIS — I651 Occlusion and stenosis of basilar artery: Secondary | ICD-10-CM | POA: Diagnosis not present

## 2018-11-16 DIAGNOSIS — N183 Chronic kidney disease, stage 3 (moderate): Secondary | ICD-10-CM | POA: Diagnosis not present

## 2018-11-16 DIAGNOSIS — T466X5D Adverse effect of antihyperlipidemic and antiarteriosclerotic drugs, subsequent encounter: Secondary | ICD-10-CM | POA: Diagnosis not present

## 2018-11-16 DIAGNOSIS — H8113 Benign paroxysmal vertigo, bilateral: Secondary | ICD-10-CM | POA: Diagnosis not present

## 2018-11-16 DIAGNOSIS — M8580 Other specified disorders of bone density and structure, unspecified site: Secondary | ICD-10-CM | POA: Diagnosis not present

## 2018-11-16 DIAGNOSIS — G720 Drug-induced myopathy: Secondary | ICD-10-CM | POA: Diagnosis not present

## 2018-11-16 DIAGNOSIS — K589 Irritable bowel syndrome without diarrhea: Secondary | ICD-10-CM | POA: Diagnosis not present

## 2018-11-16 DIAGNOSIS — I5032 Chronic diastolic (congestive) heart failure: Secondary | ICD-10-CM | POA: Diagnosis not present

## 2018-11-16 DIAGNOSIS — M810 Age-related osteoporosis without current pathological fracture: Secondary | ICD-10-CM | POA: Diagnosis not present

## 2018-11-28 DIAGNOSIS — N390 Urinary tract infection, site not specified: Secondary | ICD-10-CM | POA: Diagnosis not present

## 2018-11-28 DIAGNOSIS — I5032 Chronic diastolic (congestive) heart failure: Secondary | ICD-10-CM | POA: Diagnosis not present

## 2018-11-28 DIAGNOSIS — D631 Anemia in chronic kidney disease: Secondary | ICD-10-CM | POA: Diagnosis not present

## 2018-11-28 DIAGNOSIS — I130 Hypertensive heart and chronic kidney disease with heart failure and stage 1 through stage 4 chronic kidney disease, or unspecified chronic kidney disease: Secondary | ICD-10-CM | POA: Diagnosis not present

## 2018-11-28 DIAGNOSIS — F0150 Vascular dementia without behavioral disturbance: Secondary | ICD-10-CM | POA: Diagnosis not present

## 2018-11-28 DIAGNOSIS — J45909 Unspecified asthma, uncomplicated: Secondary | ICD-10-CM | POA: Diagnosis not present

## 2018-11-28 DIAGNOSIS — M160 Bilateral primary osteoarthritis of hip: Secondary | ICD-10-CM | POA: Diagnosis not present

## 2018-11-28 DIAGNOSIS — I495 Sick sinus syndrome: Secondary | ICD-10-CM | POA: Diagnosis not present

## 2018-11-28 DIAGNOSIS — I480 Paroxysmal atrial fibrillation: Secondary | ICD-10-CM | POA: Diagnosis not present

## 2018-11-28 DIAGNOSIS — M810 Age-related osteoporosis without current pathological fracture: Secondary | ICD-10-CM | POA: Diagnosis not present

## 2018-11-28 DIAGNOSIS — N183 Chronic kidney disease, stage 3 (moderate): Secondary | ICD-10-CM | POA: Diagnosis not present

## 2018-11-28 DIAGNOSIS — M170 Bilateral primary osteoarthritis of knee: Secondary | ICD-10-CM | POA: Diagnosis not present

## 2018-11-28 DIAGNOSIS — F3341 Major depressive disorder, recurrent, in partial remission: Secondary | ICD-10-CM | POA: Diagnosis not present

## 2018-12-09 DIAGNOSIS — G8929 Other chronic pain: Secondary | ICD-10-CM | POA: Diagnosis not present

## 2018-12-09 DIAGNOSIS — J45909 Unspecified asthma, uncomplicated: Secondary | ICD-10-CM | POA: Diagnosis not present

## 2018-12-09 DIAGNOSIS — M160 Bilateral primary osteoarthritis of hip: Secondary | ICD-10-CM | POA: Diagnosis not present

## 2018-12-09 DIAGNOSIS — I130 Hypertensive heart and chronic kidney disease with heart failure and stage 1 through stage 4 chronic kidney disease, or unspecified chronic kidney disease: Secondary | ICD-10-CM | POA: Diagnosis not present

## 2018-12-09 DIAGNOSIS — G609 Hereditary and idiopathic neuropathy, unspecified: Secondary | ICD-10-CM | POA: Diagnosis not present

## 2018-12-09 DIAGNOSIS — G720 Drug-induced myopathy: Secondary | ICD-10-CM | POA: Diagnosis not present

## 2018-12-09 DIAGNOSIS — R32 Unspecified urinary incontinence: Secondary | ICD-10-CM | POA: Diagnosis not present

## 2018-12-09 DIAGNOSIS — D631 Anemia in chronic kidney disease: Secondary | ICD-10-CM | POA: Diagnosis not present

## 2018-12-09 DIAGNOSIS — D509 Iron deficiency anemia, unspecified: Secondary | ICD-10-CM | POA: Diagnosis not present

## 2018-12-09 DIAGNOSIS — T466X5D Adverse effect of antihyperlipidemic and antiarteriosclerotic drugs, subsequent encounter: Secondary | ICD-10-CM | POA: Diagnosis not present

## 2018-12-09 DIAGNOSIS — K589 Irritable bowel syndrome without diarrhea: Secondary | ICD-10-CM | POA: Diagnosis not present

## 2018-12-09 DIAGNOSIS — E559 Vitamin D deficiency, unspecified: Secondary | ICD-10-CM | POA: Diagnosis not present

## 2018-12-09 DIAGNOSIS — R269 Unspecified abnormalities of gait and mobility: Secondary | ICD-10-CM | POA: Diagnosis not present

## 2018-12-09 DIAGNOSIS — M8580 Other specified disorders of bone density and structure, unspecified site: Secondary | ICD-10-CM | POA: Diagnosis not present

## 2018-12-09 DIAGNOSIS — F3341 Major depressive disorder, recurrent, in partial remission: Secondary | ICD-10-CM | POA: Diagnosis not present

## 2018-12-09 DIAGNOSIS — I495 Sick sinus syndrome: Secondary | ICD-10-CM | POA: Diagnosis not present

## 2018-12-09 DIAGNOSIS — H8113 Benign paroxysmal vertigo, bilateral: Secondary | ICD-10-CM | POA: Diagnosis not present

## 2018-12-09 DIAGNOSIS — G2581 Restless legs syndrome: Secondary | ICD-10-CM | POA: Diagnosis not present

## 2018-12-09 DIAGNOSIS — I471 Supraventricular tachycardia: Secondary | ICD-10-CM | POA: Diagnosis not present

## 2018-12-09 DIAGNOSIS — I5032 Chronic diastolic (congestive) heart failure: Secondary | ICD-10-CM | POA: Diagnosis not present

## 2018-12-09 DIAGNOSIS — I651 Occlusion and stenosis of basilar artery: Secondary | ICD-10-CM | POA: Diagnosis not present

## 2018-12-09 DIAGNOSIS — I480 Paroxysmal atrial fibrillation: Secondary | ICD-10-CM | POA: Diagnosis not present

## 2018-12-09 DIAGNOSIS — M170 Bilateral primary osteoarthritis of knee: Secondary | ICD-10-CM | POA: Diagnosis not present

## 2018-12-09 DIAGNOSIS — F0150 Vascular dementia without behavioral disturbance: Secondary | ICD-10-CM | POA: Diagnosis not present

## 2018-12-09 DIAGNOSIS — M810 Age-related osteoporosis without current pathological fracture: Secondary | ICD-10-CM | POA: Diagnosis not present

## 2018-12-09 DIAGNOSIS — M5442 Lumbago with sciatica, left side: Secondary | ICD-10-CM | POA: Diagnosis not present

## 2018-12-09 DIAGNOSIS — N183 Chronic kidney disease, stage 3 (moderate): Secondary | ICD-10-CM | POA: Diagnosis not present

## 2018-12-14 DIAGNOSIS — N3941 Urge incontinence: Secondary | ICD-10-CM | POA: Diagnosis not present

## 2018-12-20 DIAGNOSIS — R8271 Bacteriuria: Secondary | ICD-10-CM | POA: Diagnosis not present

## 2018-12-20 DIAGNOSIS — N3941 Urge incontinence: Secondary | ICD-10-CM | POA: Diagnosis not present

## 2018-12-23 DIAGNOSIS — I651 Occlusion and stenosis of basilar artery: Secondary | ICD-10-CM | POA: Diagnosis not present

## 2018-12-23 DIAGNOSIS — I480 Paroxysmal atrial fibrillation: Secondary | ICD-10-CM | POA: Diagnosis not present

## 2018-12-23 DIAGNOSIS — K589 Irritable bowel syndrome without diarrhea: Secondary | ICD-10-CM | POA: Diagnosis not present

## 2018-12-23 DIAGNOSIS — G720 Drug-induced myopathy: Secondary | ICD-10-CM | POA: Diagnosis not present

## 2018-12-23 DIAGNOSIS — I495 Sick sinus syndrome: Secondary | ICD-10-CM | POA: Diagnosis not present

## 2018-12-23 DIAGNOSIS — N183 Chronic kidney disease, stage 3 (moderate): Secondary | ICD-10-CM | POA: Diagnosis not present

## 2018-12-23 DIAGNOSIS — D631 Anemia in chronic kidney disease: Secondary | ICD-10-CM | POA: Diagnosis not present

## 2018-12-23 DIAGNOSIS — D509 Iron deficiency anemia, unspecified: Secondary | ICD-10-CM | POA: Diagnosis not present

## 2018-12-23 DIAGNOSIS — E559 Vitamin D deficiency, unspecified: Secondary | ICD-10-CM | POA: Diagnosis not present

## 2018-12-23 DIAGNOSIS — F0150 Vascular dementia without behavioral disturbance: Secondary | ICD-10-CM | POA: Diagnosis not present

## 2018-12-23 DIAGNOSIS — M810 Age-related osteoporosis without current pathological fracture: Secondary | ICD-10-CM | POA: Diagnosis not present

## 2018-12-23 DIAGNOSIS — J45909 Unspecified asthma, uncomplicated: Secondary | ICD-10-CM | POA: Diagnosis not present

## 2018-12-23 DIAGNOSIS — F3341 Major depressive disorder, recurrent, in partial remission: Secondary | ICD-10-CM | POA: Diagnosis not present

## 2018-12-23 DIAGNOSIS — G2581 Restless legs syndrome: Secondary | ICD-10-CM | POA: Diagnosis not present

## 2018-12-23 DIAGNOSIS — G8929 Other chronic pain: Secondary | ICD-10-CM | POA: Diagnosis not present

## 2018-12-23 DIAGNOSIS — I130 Hypertensive heart and chronic kidney disease with heart failure and stage 1 through stage 4 chronic kidney disease, or unspecified chronic kidney disease: Secondary | ICD-10-CM | POA: Diagnosis not present

## 2018-12-23 DIAGNOSIS — I471 Supraventricular tachycardia: Secondary | ICD-10-CM | POA: Diagnosis not present

## 2018-12-23 DIAGNOSIS — T466X5D Adverse effect of antihyperlipidemic and antiarteriosclerotic drugs, subsequent encounter: Secondary | ICD-10-CM | POA: Diagnosis not present

## 2018-12-23 DIAGNOSIS — I5032 Chronic diastolic (congestive) heart failure: Secondary | ICD-10-CM | POA: Diagnosis not present

## 2018-12-23 DIAGNOSIS — M5442 Lumbago with sciatica, left side: Secondary | ICD-10-CM | POA: Diagnosis not present

## 2018-12-23 DIAGNOSIS — H8113 Benign paroxysmal vertigo, bilateral: Secondary | ICD-10-CM | POA: Diagnosis not present

## 2018-12-23 DIAGNOSIS — M8580 Other specified disorders of bone density and structure, unspecified site: Secondary | ICD-10-CM | POA: Diagnosis not present

## 2018-12-23 DIAGNOSIS — M170 Bilateral primary osteoarthritis of knee: Secondary | ICD-10-CM | POA: Diagnosis not present

## 2018-12-23 DIAGNOSIS — G609 Hereditary and idiopathic neuropathy, unspecified: Secondary | ICD-10-CM | POA: Diagnosis not present

## 2018-12-23 DIAGNOSIS — M160 Bilateral primary osteoarthritis of hip: Secondary | ICD-10-CM | POA: Diagnosis not present

## 2019-01-08 DIAGNOSIS — R269 Unspecified abnormalities of gait and mobility: Secondary | ICD-10-CM | POA: Diagnosis not present

## 2019-01-08 DIAGNOSIS — I651 Occlusion and stenosis of basilar artery: Secondary | ICD-10-CM | POA: Diagnosis not present

## 2019-01-08 DIAGNOSIS — F0150 Vascular dementia without behavioral disturbance: Secondary | ICD-10-CM | POA: Diagnosis not present

## 2019-01-08 DIAGNOSIS — R32 Unspecified urinary incontinence: Secondary | ICD-10-CM | POA: Diagnosis not present

## 2019-01-11 DIAGNOSIS — F0150 Vascular dementia without behavioral disturbance: Secondary | ICD-10-CM | POA: Diagnosis not present

## 2019-01-11 DIAGNOSIS — Z87440 Personal history of urinary (tract) infections: Secondary | ICD-10-CM | POA: Diagnosis not present

## 2019-01-11 DIAGNOSIS — R296 Repeated falls: Secondary | ICD-10-CM | POA: Diagnosis not present

## 2019-01-11 DIAGNOSIS — I480 Paroxysmal atrial fibrillation: Secondary | ICD-10-CM | POA: Diagnosis not present

## 2019-01-11 DIAGNOSIS — G609 Hereditary and idiopathic neuropathy, unspecified: Secondary | ICD-10-CM | POA: Diagnosis not present

## 2019-01-11 DIAGNOSIS — G720 Drug-induced myopathy: Secondary | ICD-10-CM | POA: Diagnosis not present

## 2019-01-11 DIAGNOSIS — N183 Chronic kidney disease, stage 3 unspecified: Secondary | ICD-10-CM | POA: Diagnosis not present

## 2019-01-11 DIAGNOSIS — I129 Hypertensive chronic kidney disease with stage 1 through stage 4 chronic kidney disease, or unspecified chronic kidney disease: Secondary | ICD-10-CM | POA: Diagnosis not present

## 2019-01-11 DIAGNOSIS — E032 Hypothyroidism due to medicaments and other exogenous substances: Secondary | ICD-10-CM | POA: Diagnosis not present

## 2019-01-11 DIAGNOSIS — I495 Sick sinus syndrome: Secondary | ICD-10-CM | POA: Diagnosis not present

## 2019-01-11 DIAGNOSIS — Z7689 Persons encountering health services in other specified circumstances: Secondary | ICD-10-CM | POA: Diagnosis not present

## 2019-01-11 DIAGNOSIS — I5032 Chronic diastolic (congestive) heart failure: Secondary | ICD-10-CM | POA: Diagnosis not present

## 2019-01-23 DIAGNOSIS — N3941 Urge incontinence: Secondary | ICD-10-CM | POA: Diagnosis not present

## 2019-02-08 DIAGNOSIS — F0150 Vascular dementia without behavioral disturbance: Secondary | ICD-10-CM | POA: Diagnosis not present

## 2019-02-08 DIAGNOSIS — R269 Unspecified abnormalities of gait and mobility: Secondary | ICD-10-CM | POA: Diagnosis not present

## 2019-02-08 DIAGNOSIS — I651 Occlusion and stenosis of basilar artery: Secondary | ICD-10-CM | POA: Diagnosis not present

## 2019-02-08 DIAGNOSIS — R32 Unspecified urinary incontinence: Secondary | ICD-10-CM | POA: Diagnosis not present

## 2019-02-09 DIAGNOSIS — I495 Sick sinus syndrome: Secondary | ICD-10-CM | POA: Diagnosis not present

## 2019-02-09 DIAGNOSIS — Z45018 Encounter for adjustment and management of other part of cardiac pacemaker: Secondary | ICD-10-CM | POA: Diagnosis not present

## 2019-02-09 DIAGNOSIS — I480 Paroxysmal atrial fibrillation: Secondary | ICD-10-CM | POA: Diagnosis not present

## 2019-02-13 DIAGNOSIS — E032 Hypothyroidism due to medicaments and other exogenous substances: Secondary | ICD-10-CM | POA: Diagnosis not present

## 2019-02-13 DIAGNOSIS — G609 Hereditary and idiopathic neuropathy, unspecified: Secondary | ICD-10-CM | POA: Diagnosis not present

## 2019-02-13 DIAGNOSIS — N183 Chronic kidney disease, stage 3 unspecified: Secondary | ICD-10-CM | POA: Diagnosis not present

## 2019-02-13 DIAGNOSIS — Z87440 Personal history of urinary (tract) infections: Secondary | ICD-10-CM | POA: Diagnosis not present

## 2019-02-13 DIAGNOSIS — T466X5A Adverse effect of antihyperlipidemic and antiarteriosclerotic drugs, initial encounter: Secondary | ICD-10-CM | POA: Diagnosis not present

## 2019-02-13 DIAGNOSIS — M1711 Unilateral primary osteoarthritis, right knee: Secondary | ICD-10-CM | POA: Diagnosis not present

## 2019-02-13 DIAGNOSIS — I495 Sick sinus syndrome: Secondary | ICD-10-CM | POA: Diagnosis not present

## 2019-02-13 DIAGNOSIS — G720 Drug-induced myopathy: Secondary | ICD-10-CM | POA: Diagnosis not present

## 2019-02-13 DIAGNOSIS — R7989 Other specified abnormal findings of blood chemistry: Secondary | ICD-10-CM | POA: Diagnosis not present

## 2019-02-13 DIAGNOSIS — R296 Repeated falls: Secondary | ICD-10-CM | POA: Diagnosis not present

## 2019-02-13 DIAGNOSIS — F0150 Vascular dementia without behavioral disturbance: Secondary | ICD-10-CM | POA: Diagnosis not present

## 2019-02-13 DIAGNOSIS — E782 Mixed hyperlipidemia: Secondary | ICD-10-CM | POA: Diagnosis not present

## 2019-02-13 DIAGNOSIS — I129 Hypertensive chronic kidney disease with stage 1 through stage 4 chronic kidney disease, or unspecified chronic kidney disease: Secondary | ICD-10-CM | POA: Diagnosis not present

## 2019-02-13 DIAGNOSIS — I480 Paroxysmal atrial fibrillation: Secondary | ICD-10-CM | POA: Diagnosis not present

## 2019-02-13 DIAGNOSIS — E876 Hypokalemia: Secondary | ICD-10-CM | POA: Diagnosis not present

## 2019-02-22 DIAGNOSIS — R296 Repeated falls: Secondary | ICD-10-CM | POA: Diagnosis not present

## 2019-02-22 DIAGNOSIS — M25561 Pain in right knee: Secondary | ICD-10-CM | POA: Diagnosis not present

## 2019-02-22 DIAGNOSIS — G8929 Other chronic pain: Secondary | ICD-10-CM | POA: Diagnosis not present

## 2019-02-23 DIAGNOSIS — E039 Hypothyroidism, unspecified: Secondary | ICD-10-CM | POA: Diagnosis not present

## 2019-02-23 DIAGNOSIS — E559 Vitamin D deficiency, unspecified: Secondary | ICD-10-CM | POA: Diagnosis not present

## 2019-02-23 DIAGNOSIS — M1711 Unilateral primary osteoarthritis, right knee: Secondary | ICD-10-CM | POA: Diagnosis not present

## 2019-02-23 DIAGNOSIS — G3184 Mild cognitive impairment, so stated: Secondary | ICD-10-CM | POA: Diagnosis not present

## 2019-02-23 DIAGNOSIS — G609 Hereditary and idiopathic neuropathy, unspecified: Secondary | ICD-10-CM | POA: Diagnosis not present

## 2019-02-23 DIAGNOSIS — F0150 Vascular dementia without behavioral disturbance: Secondary | ICD-10-CM | POA: Diagnosis not present

## 2019-02-23 DIAGNOSIS — M8589 Other specified disorders of bone density and structure, multiple sites: Secondary | ICD-10-CM | POA: Diagnosis not present

## 2019-02-23 DIAGNOSIS — I480 Paroxysmal atrial fibrillation: Secondary | ICD-10-CM | POA: Diagnosis not present

## 2019-02-23 DIAGNOSIS — D509 Iron deficiency anemia, unspecified: Secondary | ICD-10-CM | POA: Diagnosis not present

## 2019-02-23 DIAGNOSIS — H8113 Benign paroxysmal vertigo, bilateral: Secondary | ICD-10-CM | POA: Diagnosis not present

## 2019-02-23 DIAGNOSIS — M5442 Lumbago with sciatica, left side: Secondary | ICD-10-CM | POA: Diagnosis not present

## 2019-02-23 DIAGNOSIS — I130 Hypertensive heart and chronic kidney disease with heart failure and stage 1 through stage 4 chronic kidney disease, or unspecified chronic kidney disease: Secondary | ICD-10-CM | POA: Diagnosis not present

## 2019-02-23 DIAGNOSIS — H6121 Impacted cerumen, right ear: Secondary | ICD-10-CM | POA: Diagnosis not present

## 2019-02-23 DIAGNOSIS — K5900 Constipation, unspecified: Secondary | ICD-10-CM | POA: Diagnosis not present

## 2019-02-23 DIAGNOSIS — I5032 Chronic diastolic (congestive) heart failure: Secondary | ICD-10-CM | POA: Diagnosis not present

## 2019-02-23 DIAGNOSIS — D631 Anemia in chronic kidney disease: Secondary | ICD-10-CM | POA: Diagnosis not present

## 2019-02-23 DIAGNOSIS — G2581 Restless legs syndrome: Secondary | ICD-10-CM | POA: Diagnosis not present

## 2019-02-23 DIAGNOSIS — I471 Supraventricular tachycardia: Secondary | ICD-10-CM | POA: Diagnosis not present

## 2019-02-23 DIAGNOSIS — E785 Hyperlipidemia, unspecified: Secondary | ICD-10-CM | POA: Diagnosis not present

## 2019-02-23 DIAGNOSIS — G8929 Other chronic pain: Secondary | ICD-10-CM | POA: Diagnosis not present

## 2019-02-23 DIAGNOSIS — R32 Unspecified urinary incontinence: Secondary | ICD-10-CM | POA: Diagnosis not present

## 2019-02-23 DIAGNOSIS — N183 Chronic kidney disease, stage 3 unspecified: Secondary | ICD-10-CM | POA: Diagnosis not present

## 2019-02-23 DIAGNOSIS — I495 Sick sinus syndrome: Secondary | ICD-10-CM | POA: Diagnosis not present

## 2019-02-23 DIAGNOSIS — F3341 Major depressive disorder, recurrent, in partial remission: Secondary | ICD-10-CM | POA: Diagnosis not present

## 2019-02-23 DIAGNOSIS — J45991 Cough variant asthma: Secondary | ICD-10-CM | POA: Diagnosis not present

## 2019-03-06 DIAGNOSIS — N3941 Urge incontinence: Secondary | ICD-10-CM | POA: Diagnosis not present

## 2019-03-07 DIAGNOSIS — M1711 Unilateral primary osteoarthritis, right knee: Secondary | ICD-10-CM | POA: Diagnosis not present

## 2019-03-07 DIAGNOSIS — H8113 Benign paroxysmal vertigo, bilateral: Secondary | ICD-10-CM | POA: Diagnosis not present

## 2019-03-07 DIAGNOSIS — I480 Paroxysmal atrial fibrillation: Secondary | ICD-10-CM | POA: Diagnosis not present

## 2019-03-07 DIAGNOSIS — K5900 Constipation, unspecified: Secondary | ICD-10-CM | POA: Diagnosis not present

## 2019-03-07 DIAGNOSIS — E785 Hyperlipidemia, unspecified: Secondary | ICD-10-CM | POA: Diagnosis not present

## 2019-03-07 DIAGNOSIS — M8589 Other specified disorders of bone density and structure, multiple sites: Secondary | ICD-10-CM | POA: Diagnosis not present

## 2019-03-07 DIAGNOSIS — N183 Chronic kidney disease, stage 3 unspecified: Secondary | ICD-10-CM | POA: Diagnosis not present

## 2019-03-07 DIAGNOSIS — R32 Unspecified urinary incontinence: Secondary | ICD-10-CM | POA: Diagnosis not present

## 2019-03-07 DIAGNOSIS — F3341 Major depressive disorder, recurrent, in partial remission: Secondary | ICD-10-CM | POA: Diagnosis not present

## 2019-03-07 DIAGNOSIS — E559 Vitamin D deficiency, unspecified: Secondary | ICD-10-CM | POA: Diagnosis not present

## 2019-03-07 DIAGNOSIS — F0150 Vascular dementia without behavioral disturbance: Secondary | ICD-10-CM | POA: Diagnosis not present

## 2019-03-07 DIAGNOSIS — I130 Hypertensive heart and chronic kidney disease with heart failure and stage 1 through stage 4 chronic kidney disease, or unspecified chronic kidney disease: Secondary | ICD-10-CM | POA: Diagnosis not present

## 2019-03-07 DIAGNOSIS — G8929 Other chronic pain: Secondary | ICD-10-CM | POA: Diagnosis not present

## 2019-03-07 DIAGNOSIS — I5032 Chronic diastolic (congestive) heart failure: Secondary | ICD-10-CM | POA: Diagnosis not present

## 2019-03-07 DIAGNOSIS — I471 Supraventricular tachycardia: Secondary | ICD-10-CM | POA: Diagnosis not present

## 2019-03-07 DIAGNOSIS — E039 Hypothyroidism, unspecified: Secondary | ICD-10-CM | POA: Diagnosis not present

## 2019-03-07 DIAGNOSIS — D631 Anemia in chronic kidney disease: Secondary | ICD-10-CM | POA: Diagnosis not present

## 2019-03-07 DIAGNOSIS — J45991 Cough variant asthma: Secondary | ICD-10-CM | POA: Diagnosis not present

## 2019-03-07 DIAGNOSIS — H6121 Impacted cerumen, right ear: Secondary | ICD-10-CM | POA: Diagnosis not present

## 2019-03-07 DIAGNOSIS — D509 Iron deficiency anemia, unspecified: Secondary | ICD-10-CM | POA: Diagnosis not present

## 2019-03-07 DIAGNOSIS — G2581 Restless legs syndrome: Secondary | ICD-10-CM | POA: Diagnosis not present

## 2019-03-07 DIAGNOSIS — G609 Hereditary and idiopathic neuropathy, unspecified: Secondary | ICD-10-CM | POA: Diagnosis not present

## 2019-03-07 DIAGNOSIS — G3184 Mild cognitive impairment, so stated: Secondary | ICD-10-CM | POA: Diagnosis not present

## 2019-03-07 DIAGNOSIS — I495 Sick sinus syndrome: Secondary | ICD-10-CM | POA: Diagnosis not present

## 2019-03-07 DIAGNOSIS — M5442 Lumbago with sciatica, left side: Secondary | ICD-10-CM | POA: Diagnosis not present

## 2019-03-10 DIAGNOSIS — F0150 Vascular dementia without behavioral disturbance: Secondary | ICD-10-CM | POA: Diagnosis not present

## 2019-03-10 DIAGNOSIS — R32 Unspecified urinary incontinence: Secondary | ICD-10-CM | POA: Diagnosis not present

## 2019-03-10 DIAGNOSIS — I651 Occlusion and stenosis of basilar artery: Secondary | ICD-10-CM | POA: Diagnosis not present

## 2019-03-10 DIAGNOSIS — R269 Unspecified abnormalities of gait and mobility: Secondary | ICD-10-CM | POA: Diagnosis not present

## 2019-03-28 DIAGNOSIS — I495 Sick sinus syndrome: Secondary | ICD-10-CM | POA: Diagnosis not present

## 2019-03-28 DIAGNOSIS — I480 Paroxysmal atrial fibrillation: Secondary | ICD-10-CM | POA: Diagnosis not present

## 2019-03-28 DIAGNOSIS — F0150 Vascular dementia without behavioral disturbance: Secondary | ICD-10-CM | POA: Diagnosis not present

## 2019-03-28 DIAGNOSIS — M8589 Other specified disorders of bone density and structure, multiple sites: Secondary | ICD-10-CM | POA: Diagnosis not present

## 2019-03-28 DIAGNOSIS — D631 Anemia in chronic kidney disease: Secondary | ICD-10-CM | POA: Diagnosis not present

## 2019-03-28 DIAGNOSIS — M5442 Lumbago with sciatica, left side: Secondary | ICD-10-CM | POA: Diagnosis not present

## 2019-03-28 DIAGNOSIS — G8929 Other chronic pain: Secondary | ICD-10-CM | POA: Diagnosis not present

## 2019-03-28 DIAGNOSIS — D509 Iron deficiency anemia, unspecified: Secondary | ICD-10-CM | POA: Diagnosis not present

## 2019-03-28 DIAGNOSIS — I5032 Chronic diastolic (congestive) heart failure: Secondary | ICD-10-CM | POA: Diagnosis not present

## 2019-03-28 DIAGNOSIS — H6121 Impacted cerumen, right ear: Secondary | ICD-10-CM | POA: Diagnosis not present

## 2019-03-28 DIAGNOSIS — I130 Hypertensive heart and chronic kidney disease with heart failure and stage 1 through stage 4 chronic kidney disease, or unspecified chronic kidney disease: Secondary | ICD-10-CM | POA: Diagnosis not present

## 2019-03-28 DIAGNOSIS — K5900 Constipation, unspecified: Secondary | ICD-10-CM | POA: Diagnosis not present

## 2019-03-28 DIAGNOSIS — N183 Chronic kidney disease, stage 3 unspecified: Secondary | ICD-10-CM | POA: Diagnosis not present

## 2019-03-28 DIAGNOSIS — E039 Hypothyroidism, unspecified: Secondary | ICD-10-CM | POA: Diagnosis not present

## 2019-03-28 DIAGNOSIS — I471 Supraventricular tachycardia: Secondary | ICD-10-CM | POA: Diagnosis not present

## 2019-03-28 DIAGNOSIS — H8113 Benign paroxysmal vertigo, bilateral: Secondary | ICD-10-CM | POA: Diagnosis not present

## 2019-03-28 DIAGNOSIS — E785 Hyperlipidemia, unspecified: Secondary | ICD-10-CM | POA: Diagnosis not present

## 2019-03-28 DIAGNOSIS — R32 Unspecified urinary incontinence: Secondary | ICD-10-CM | POA: Diagnosis not present

## 2019-03-28 DIAGNOSIS — F3341 Major depressive disorder, recurrent, in partial remission: Secondary | ICD-10-CM | POA: Diagnosis not present

## 2019-03-28 DIAGNOSIS — G2581 Restless legs syndrome: Secondary | ICD-10-CM | POA: Diagnosis not present

## 2019-03-28 DIAGNOSIS — G609 Hereditary and idiopathic neuropathy, unspecified: Secondary | ICD-10-CM | POA: Diagnosis not present

## 2019-03-28 DIAGNOSIS — G3184 Mild cognitive impairment, so stated: Secondary | ICD-10-CM | POA: Diagnosis not present

## 2019-03-28 DIAGNOSIS — E559 Vitamin D deficiency, unspecified: Secondary | ICD-10-CM | POA: Diagnosis not present

## 2019-03-28 DIAGNOSIS — M1711 Unilateral primary osteoarthritis, right knee: Secondary | ICD-10-CM | POA: Diagnosis not present

## 2019-03-28 DIAGNOSIS — J45991 Cough variant asthma: Secondary | ICD-10-CM | POA: Diagnosis not present

## 2019-04-10 DIAGNOSIS — R269 Unspecified abnormalities of gait and mobility: Secondary | ICD-10-CM | POA: Diagnosis not present

## 2019-04-10 DIAGNOSIS — R32 Unspecified urinary incontinence: Secondary | ICD-10-CM | POA: Diagnosis not present

## 2019-04-10 DIAGNOSIS — F0150 Vascular dementia without behavioral disturbance: Secondary | ICD-10-CM | POA: Diagnosis not present

## 2019-04-10 DIAGNOSIS — I651 Occlusion and stenosis of basilar artery: Secondary | ICD-10-CM | POA: Diagnosis not present

## 2019-04-14 ENCOUNTER — Encounter (HOSPITAL_BASED_OUTPATIENT_CLINIC_OR_DEPARTMENT_OTHER): Payer: Self-pay

## 2019-04-14 ENCOUNTER — Other Ambulatory Visit: Payer: Self-pay

## 2019-04-14 ENCOUNTER — Emergency Department (HOSPITAL_BASED_OUTPATIENT_CLINIC_OR_DEPARTMENT_OTHER)
Admission: EM | Admit: 2019-04-14 | Discharge: 2019-04-14 | Disposition: A | Discharge status: Home / Self Care | Payer: PPO | Attending: Emergency Medicine | Admitting: Emergency Medicine

## 2019-04-14 DIAGNOSIS — E039 Hypothyroidism, unspecified: Secondary | ICD-10-CM | POA: Insufficient documentation

## 2019-04-14 DIAGNOSIS — Z79899 Other long term (current) drug therapy: Secondary | ICD-10-CM | POA: Diagnosis not present

## 2019-04-14 DIAGNOSIS — R52 Pain, unspecified: Secondary | ICD-10-CM | POA: Diagnosis not present

## 2019-04-14 DIAGNOSIS — N390 Urinary tract infection, site not specified: Secondary | ICD-10-CM | POA: Diagnosis not present

## 2019-04-14 DIAGNOSIS — Z7902 Long term (current) use of antithrombotics/antiplatelets: Secondary | ICD-10-CM | POA: Insufficient documentation

## 2019-04-14 DIAGNOSIS — I10 Essential (primary) hypertension: Secondary | ICD-10-CM | POA: Diagnosis not present

## 2019-04-14 DIAGNOSIS — R000 Tachycardia, unspecified: Secondary | ICD-10-CM | POA: Diagnosis not present

## 2019-04-14 DIAGNOSIS — R300 Dysuria: Secondary | ICD-10-CM | POA: Diagnosis not present

## 2019-04-14 LAB — URINALYSIS, ROUTINE W REFLEX MICROSCOPIC
Bilirubin Urine: NEGATIVE
Glucose, UA: NEGATIVE mg/dL
Ketones, ur: NEGATIVE mg/dL
Nitrite: NEGATIVE
Protein, ur: 100 mg/dL — AB
Specific Gravity, Urine: 1.025 (ref 1.005–1.030)
pH: 7 (ref 5.0–8.0)

## 2019-04-14 LAB — CBC WITH DIFFERENTIAL/PLATELET
Abs Immature Granulocytes: 0.02 10*3/uL (ref 0.00–0.07)
Basophils Absolute: 0 10*3/uL (ref 0.0–0.1)
Basophils Relative: 1 %
Eosinophils Absolute: 0.3 10*3/uL (ref 0.0–0.5)
Eosinophils Relative: 5 %
HCT: 35 % — ABNORMAL LOW (ref 36.0–46.0)
Hemoglobin: 11.8 g/dL — ABNORMAL LOW (ref 12.0–15.0)
Immature Granulocytes: 0 %
Lymphocytes Relative: 36 %
Lymphs Abs: 2.1 10*3/uL (ref 0.7–4.0)
MCH: 29.9 pg (ref 26.0–34.0)
MCHC: 33.7 g/dL (ref 30.0–36.0)
MCV: 88.6 fL (ref 80.0–100.0)
Monocytes Absolute: 0.6 10*3/uL (ref 0.1–1.0)
Monocytes Relative: 10 %
Neutro Abs: 2.8 10*3/uL (ref 1.7–7.7)
Neutrophils Relative %: 48 %
Platelets: 166 10*3/uL (ref 150–400)
RBC: 3.95 MIL/uL (ref 3.87–5.11)
RDW: 15.1 % (ref 11.5–15.5)
WBC: 5.7 10*3/uL (ref 4.0–10.5)
nRBC: 0 % (ref 0.0–0.2)

## 2019-04-14 LAB — COMPREHENSIVE METABOLIC PANEL
ALT: 76 U/L — ABNORMAL HIGH (ref 0–44)
AST: 72 U/L — ABNORMAL HIGH (ref 15–41)
Albumin: 3.3 g/dL — ABNORMAL LOW (ref 3.5–5.0)
Alkaline Phosphatase: 76 U/L (ref 38–126)
Anion gap: 12 (ref 5–15)
BUN: 18 mg/dL (ref 8–23)
CO2: 26 mmol/L (ref 22–32)
Calcium: 8.8 mg/dL — ABNORMAL LOW (ref 8.9–10.3)
Chloride: 103 mmol/L (ref 98–111)
Creatinine, Ser: 1.31 mg/dL — ABNORMAL HIGH (ref 0.44–1.00)
GFR calc Af Amer: 41 mL/min — ABNORMAL LOW (ref >60)
GFR calc non Af Amer: 36 mL/min — ABNORMAL LOW (ref >60)
Glucose, Bld: 90 mg/dL (ref 70–99)
Potassium: 3 mmol/L — ABNORMAL LOW (ref 3.5–5.1)
Sodium: 141 mmol/L (ref 135–145)
Total Bilirubin: 0.9 mg/dL (ref 0.3–1.2)
Total Protein: 6.3 g/dL — ABNORMAL LOW (ref 6.5–8.1)

## 2019-04-14 LAB — URINALYSIS, MICROSCOPIC (REFLEX)
RBC / HPF: >50 RBC/hpf (ref 0–5)
WBC, UA: >50 WBC/hpf (ref 0–5)

## 2019-04-14 MED ORDER — CEPHALEXIN 500 MG PO CAPS: 500.0000 mg | ORAL_CAPSULE | Freq: Two times a day (BID) | ORAL | 0 refills | Status: DC

## 2019-04-14 MED ORDER — CEPHALEXIN 250 MG PO CAPS
500.0000 mg | ORAL_CAPSULE | Freq: Once | ORAL | Status: AC
  Administered 2019-04-14: 500 mg via ORAL
  Filled 2019-04-14: qty 2

## 2019-04-14 MED ORDER — POTASSIUM CHLORIDE CRYS ER 20 MEQ PO TBCR
40.0000 meq | EXTENDED_RELEASE_TABLET | Freq: Once | ORAL | Status: AC
  Administered 2019-04-14: 14:00:00 40 meq via ORAL

## 2019-04-14 NOTE — ED Provider Notes (Signed)
[place] EMERGENCY DEPARTMENT Provider Note   CSN: [id number] Arrival date & time: 04/14/19  1239     History Chief Complaint  Patient presents with  . Urinary Tract Infection    Jade Juarez is a 86 y.o. female.  The history is provided by the patient and medical records. No language interpreter was used.  Urinary Tract Infection  Jade Juarez is a 86 y.o. female who presents to the Emergency Department complaining of dysuria. She presents the emergency department from [facility] assisted living for evaluation of one week of dysuria and urinary frequency. She has a history of similar symptoms in the past related to UTI. She denies any fevers, nausea, vomiting, Donald pain. She feels like she can only get out a few drops at a time when she goes to urinate. She states that she attempted to contact her urologist about the symptoms but was unable to get through to the office. She did have Botox injections in December. She was last treated for UTI in December with ciprofloxacin.    Past Medical History:  Diagnosis Date  . Anemia    takes Ferrous Sulfate daily  . Anxiety   . Arthritis    right knees  . Atrial fibrillation ([place])    prescribed Eliquis but hasn't started-waiting until after this procedure per [place]  . Depression    takes Zoloft daily  . Diverticulitis    hx of  . Frequent UTI   . GERD (gastroesophageal reflux disease)    hx of-yrs ago  . Headache    occasionally  . Heart murmur   . High cholesterol    takes Atorvastatin daily  . History of bronchitis    about a yr ago  . History of colon polyps   . Hypertension    takes Diltiazem and Diovan daily  . Hypothyroidism    takes Synthroid daily  . IBS (irritable bowel syndrome)    PMH  . Incontinence   . Joint pain   . Joint swelling   . Nocturia   . Overactive bladder   . PONV (postoperative nausea and vomiting)   . Presence of permanent cardiac pacemaker   . Restless leg syndrome   .  Status post placement of implantable loop recorder 05/2014  . Stool incontinence    at times  . Stroke [facility])    takes Plavix daily-right sided weakness  . Vertebrobasilar artery insufficiency   . Wears glasses     Patient Active Problem List   Diagnosis Date Noted  . Basilar artery stenosis 11/14/2014  . Occlusion and stenosis of basilar artery     Past Surgical History:  Procedure Laterality Date  . ABDOMINAL HYSTERECTOMY    . bladder tack     x 2  . BREAST SURGERY Right    cyst removed  . COLONOSCOPY    . EYE SURGERY Bilateral    cataract surgery  . INSERT / REPLACE / REMOVE PACEMAKER    . LOOP RECORDER IMPLANT  05/23/2014   Medtronic Reveal loop recorder (Dr. Lisa Roca, [facility])  . RADIOLOGY WITH ANESTHESIA N/A 08/15/2014   Procedure: RADIOLOGY WITH ANESTHESIA;  Surgeon: Luanne Bras, MD;  Location: [place];  Service: Radiology;  Laterality: N/A;  . RADIOLOGY WITH ANESTHESIA N/A 11/05/2014   Procedure: ANGIOPLASTY WITH POSSIBLE STENTING      (RADIOLOGY WITH ANESTHESIA );  Surgeon: Luanne Bras, MD;  Location: [place];  Service: Radiology;  Laterality: N/A;  . RADIOLOGY WITH ANESTHESIA N/A 11/14/2014  Procedure: RADIOLOGY WITH ANESTHESIA;  Surgeon: Luanne Bras, MD;  Location: [place];  Service: Radiology;  Laterality: N/A;     OB History   No obstetric history on file.     Family History  Problem Relation Age of Onset  . Hypertension Mother   . Heart attack Mother   . Cancer Father     Social History   Tobacco Use  . Smoking status: Never Smoker  . Smokeless tobacco: Never Used  Substance Use Topics  . Alcohol use: No  . Drug use: No    Home Medications Prior to Admission medications   Medication Sig Start Date End Date Taking? Authorizing Provider  amoxicillin (AMOXIL) 500 MG capsule Take 2,000 mg by mouth See admin instructions. 1 hour before dental procedure 06/17/14   [provider]  apixaban (ELIQUIS) 2.5 MG TABS tablet Take 2.5 mg by  mouth 2 (two) times daily.    [provider]  atorvastatin (LIPITOR) 10 MG tablet Take 10 mg by mouth daily.    [provider]  calcium carbonate (OS-CAL) 600 MG TABS tablet Take 600 mg by mouth daily. 2 tabs    [provider]  cephALEXin (KEFLEX) 500 MG capsule Take 1 capsule (500 mg total) by mouth 2 (two) times daily. 04/14/19   Quintella Reichert, MD  Cholecalciferol (VITAMIN D3) 5000 UNITS CAPS Take 5,000 Units by mouth daily.    [provider]  conjugated estrogens (PREMARIN) vaginal cream Place 1 Applicatorful vaginally every other day.    [provider]  diclofenac sodium (VOLTAREN) 1 % GEL Apply 2 g topically 2 (two) times daily.    [provider]  diltiazem (CARDIZEM CD) 120 MG 24 hr capsule Take 120 mg by mouth 2 (two) times daily.     [provider]  ferrous sulfate 325 (65 FE) MG tablet Take 325 mg by mouth 2 (two) times a week. Takes on Monday & Wedensday    [provider]  gabapentin (NEURONTIN) 400 MG capsule Take 400 mg by mouth at bedtime.     [provider]  glucosamine-chondroitin 500-400 MG tablet Take 1 tablet by mouth daily.    [provider]  levothyroxine (SYNTHROID, LEVOTHROID) 88 MCG tablet Take 88 mcg by mouth daily before breakfast.    [provider]  montelukast (SINGULAIR) 10 MG tablet Take 10 mg by mouth at bedtime.    [provider]  Multiple Vitamin (MULTIVITAMIN) tablet Take 1 tablet by mouth daily.    [provider]  sertraline (ZOLOFT) 50 MG tablet Take 50 mg by mouth daily.    [provider]  ticagrelor (BRILINTA) 90 MG TABS tablet Take 45-90 mg by mouth See admin instructions. 11/06/14-11/11/14=90 mg twice a day 11/12/14=90 mg daily 11/13/14=45 mg twice a day 11/14/14=45 mg in the morning... Unknowing dosing thereafter    [provider]  valsartan-hydrochlorothiazide (DIOVAN-HCT) 160-12.5 MG per tablet Take 1 tablet by mouth  daily.    [provider]    Allergies    Hibiclens [chlorhexidine gluconate], Benicar [olmesartan], Codeine, Erythromycin, Lipitor [atorvastatin], Other, Tetracyclines & related, Vibramycin [doxycycline], and Vicodin [hydrocodone-acetaminophen]  Review of Systems   Review of Systems  All other systems reviewed and are negative.   Physical Exam Updated Vital Signs BP (!) 151/100 (BP Location: Right Arm)   Pulse 72   Temp 98.3 F (36.8 C) (Oral)   Resp 16   SpO2 96%   Physical Exam Vitals and nursing note reviewed.  Constitutional:      Appearance: She is well-developed.  HENT:     Head: Normocephalic and atraumatic.  Cardiovascular:     Rate and Rhythm: Normal rate and regular rhythm.     Heart sounds: No murmur.  Pulmonary:     Effort: Pulmonary effort is normal. No respiratory distress.     Breath sounds: Normal breath sounds.  Abdominal:     Palpations: Abdomen is soft.     Tenderness: There is no guarding or rebound.     Comments: Mild suprapubic tenderness  Genitourinary:    Comments: No erythema, edema or lesions in the GU region. Musculoskeletal:        General: No tenderness.  Skin:    General: Skin is warm and dry.  Neurological:     Mental Status: She is alert and oriented to person, place, and time.  Psychiatric:        Behavior: Behavior normal.     ED Results / Procedures / Treatments   Labs (all labs ordered are listed, but only abnormal results are displayed) Labs Reviewed  COMPREHENSIVE METABOLIC PANEL - Abnormal; Notable for the following components:      Result Value   Potassium 3.0 (*)    Creatinine, Ser 1.31 (*)    Calcium 8.8 (*)    Total Protein 6.3 (*)    Albumin 3.3 (*)    AST 72 (*)    ALT 76 (*)    GFR calc non Af Amer 36 (*)    GFR calc Af Amer 41 (*)    All other components within normal limits  CBC WITH DIFFERENTIAL/PLATELET - Abnormal; Notable for the following components:   Hemoglobin 11.8 (*)    HCT 35.0 (*)      All other components within normal limits  URINALYSIS, ROUTINE W REFLEX MICROSCOPIC - Abnormal; Notable for the following components:   APPearance TURBID (*)    Hgb urine dipstick LARGE (*)    Protein, ur 100 (*)    Leukocytes,Ua LARGE (*)    All other components within normal limits  URINALYSIS, MICROSCOPIC (REFLEX) - Abnormal; Notable for the following components:   Bacteria, UA MANY (*)    All other components within normal limits  URINE CULTURE    EKG None  Radiology No results found.  Procedures Procedures (including critical care time)  Medications Ordered in ED Medications  cephALEXin (KEFLEX) capsule 500 mg (has no administration in time range)  potassium chloride SA (KLOR-CON) CR tablet 40 mEq (has no administration in time range)    ED Course  I have reviewed the triage vital signs and the nursing notes.  Pertinent labs & imaging results that were available during my care of the patient were reviewed by me and considered in my medical decision making (see chart for details).    MDM Rules/Calculators/A&P                     Patient here for evaluation of one week of dysuria. She is non-toxic appearing on evaluation. Records reviewed in care everywhere. BMP with stable renal insufficiency, does demonstrate some hypokalemia. Urinalysis is consistent with UTI, will send a culture and treat with antibiotics. Discussed with patient, daughter-in-law home care for UTI. Discussed outpatient follow-up and return precautions. Prior culture data reviewed, she did previously in September have a cephalosporin sensitive UTI.  Please note that if patient does not answer her phone for abnormal culture results that the daughter in law, Louida Coch  would like to be contacted at [phone].   Final Clinical Impression(s) / ED Diagnoses Final diagnoses:  Lower urinary tract infectious disease    Rx / DC Orders ED Discharge Orders         Ordered    cephALEXin (KEFLEX) 500 MG  capsule  2 times daily     04/14/19 1416           Quintella Reichert, MD 04/14/19 1425

## 2019-04-14 NOTE — ED Notes (Signed)
ED Provider at bedside. 

## 2019-04-14 NOTE — ED Triage Notes (Signed)
Pt arrives from [facility] [address].  Per ems pt reporting frequent urination foul odor to urine for past 7 days.  Denies n/v/d, denies pain.

## 2019-04-16 LAB — URINE CULTURE: Culture: >=100000 — AB

## 2019-04-17 ENCOUNTER — Telehealth: Payer: Self-pay | Admitting: Emergency Medicine

## 2019-04-17 DIAGNOSIS — N3000 Acute cystitis without hematuria: Secondary | ICD-10-CM | POA: Diagnosis not present

## 2019-04-17 DIAGNOSIS — Z87440 Personal history of urinary (tract) infections: Secondary | ICD-10-CM | POA: Diagnosis not present

## 2019-04-17 NOTE — Telephone Encounter (Signed)
Post ED Visit - Positive Culture Follow-up  Culture report reviewed by antimicrobial stewardship pharmacist: [place] Team []  Elenor Quinones, Pharm.D. []  Heide Guile, Pharm.D., BCPS AQ-ID []  Parks Neptune, Pharm.D., BCPS []  Alycia Rossetti, Pharm.D., BCPS []  [place], Pharm.D., BCPS, AAHIVP []  Legrand Como, Pharm.D., BCPS, AAHIVP []  Salome Arnt, PharmD, BCPS []  Johnnette Gourd, PharmD, BCPS []  Hughes Better, PharmD, BCPS []  Leeroy Cha, PharmD []  Laqueta Linden, PharmD, BCPS []  Albertina Parr, PharmD  [place] Team []  Leodis Sias, PharmD []  Lindell Spar, PharmD []  Royetta Asal, PharmD []  Graylin Shiver, Rph []  Rema Fendt) Glennon Mac, PharmD []  Arlyn Dunning, PharmD []  Netta Cedars, PharmD []  Dia Sitter, PharmD []  Leone Haven, PharmD []  Gretta Arab, PharmD []  Theodis Shove, PharmD []  Peggyann Juba, PharmD []  Reuel Boom, PharmD   Positive urine culture Treated with cephalexin, organism sensitive to the same and no further patient follow-up is required at this time.  Hazle Nordmann 04/17/2019, 11:18 AM

## 2019-04-27 DIAGNOSIS — N3000 Acute cystitis without hematuria: Secondary | ICD-10-CM | POA: Diagnosis not present

## 2019-05-03 DIAGNOSIS — R197 Diarrhea, unspecified: Secondary | ICD-10-CM | POA: Diagnosis not present

## 2019-05-03 DIAGNOSIS — Z20822 Contact with and (suspected) exposure to covid-19: Secondary | ICD-10-CM | POA: Diagnosis not present

## 2019-05-03 DIAGNOSIS — R110 Nausea: Secondary | ICD-10-CM | POA: Diagnosis not present

## 2019-05-04 DIAGNOSIS — D509 Iron deficiency anemia, unspecified: Secondary | ICD-10-CM | POA: Diagnosis not present

## 2019-05-04 DIAGNOSIS — I495 Sick sinus syndrome: Secondary | ICD-10-CM | POA: Diagnosis not present

## 2019-05-04 DIAGNOSIS — I480 Paroxysmal atrial fibrillation: Secondary | ICD-10-CM | POA: Diagnosis not present

## 2019-05-04 DIAGNOSIS — F329 Major depressive disorder, single episode, unspecified: Secondary | ICD-10-CM | POA: Diagnosis not present

## 2019-05-04 DIAGNOSIS — J45991 Cough variant asthma: Secondary | ICD-10-CM | POA: Diagnosis not present

## 2019-05-04 DIAGNOSIS — E559 Vitamin D deficiency, unspecified: Secondary | ICD-10-CM | POA: Diagnosis not present

## 2019-05-04 DIAGNOSIS — I130 Hypertensive heart and chronic kidney disease with heart failure and stage 1 through stage 4 chronic kidney disease, or unspecified chronic kidney disease: Secondary | ICD-10-CM | POA: Diagnosis not present

## 2019-05-04 DIAGNOSIS — G609 Hereditary and idiopathic neuropathy, unspecified: Secondary | ICD-10-CM | POA: Diagnosis not present

## 2019-05-04 DIAGNOSIS — Z7982 Long term (current) use of aspirin: Secondary | ICD-10-CM | POA: Diagnosis not present

## 2019-05-04 DIAGNOSIS — H6121 Impacted cerumen, right ear: Secondary | ICD-10-CM | POA: Diagnosis not present

## 2019-05-04 DIAGNOSIS — N183 Chronic kidney disease, stage 3 unspecified: Secondary | ICD-10-CM | POA: Diagnosis not present

## 2019-05-04 DIAGNOSIS — I471 Supraventricular tachycardia: Secondary | ICD-10-CM | POA: Diagnosis not present

## 2019-05-04 DIAGNOSIS — M1711 Unilateral primary osteoarthritis, right knee: Secondary | ICD-10-CM | POA: Diagnosis not present

## 2019-05-04 DIAGNOSIS — D631 Anemia in chronic kidney disease: Secondary | ICD-10-CM | POA: Diagnosis not present

## 2019-05-04 DIAGNOSIS — F0150 Vascular dementia without behavioral disturbance: Secondary | ICD-10-CM | POA: Diagnosis not present

## 2019-05-04 DIAGNOSIS — E7800 Pure hypercholesterolemia, unspecified: Secondary | ICD-10-CM | POA: Diagnosis not present

## 2019-05-04 DIAGNOSIS — Z7901 Long term (current) use of anticoagulants: Secondary | ICD-10-CM | POA: Diagnosis not present

## 2019-05-04 DIAGNOSIS — E039 Hypothyroidism, unspecified: Secondary | ICD-10-CM | POA: Diagnosis not present

## 2019-05-04 DIAGNOSIS — Z87440 Personal history of urinary (tract) infections: Secondary | ICD-10-CM | POA: Diagnosis not present

## 2019-05-04 DIAGNOSIS — E785 Hyperlipidemia, unspecified: Secondary | ICD-10-CM | POA: Diagnosis not present

## 2019-05-04 DIAGNOSIS — R197 Diarrhea, unspecified: Secondary | ICD-10-CM | POA: Diagnosis not present

## 2019-05-04 DIAGNOSIS — M5442 Lumbago with sciatica, left side: Secondary | ICD-10-CM | POA: Diagnosis not present

## 2019-05-04 DIAGNOSIS — G2581 Restless legs syndrome: Secondary | ICD-10-CM | POA: Diagnosis not present

## 2019-05-04 DIAGNOSIS — G8929 Other chronic pain: Secondary | ICD-10-CM | POA: Diagnosis not present

## 2019-05-04 DIAGNOSIS — I5032 Chronic diastolic (congestive) heart failure: Secondary | ICD-10-CM | POA: Diagnosis not present

## 2019-05-04 DIAGNOSIS — M8580 Other specified disorders of bone density and structure, unspecified site: Secondary | ICD-10-CM | POA: Diagnosis not present

## 2019-05-09 DIAGNOSIS — M6281 Muscle weakness (generalized): Secondary | ICD-10-CM | POA: Diagnosis not present

## 2019-05-09 DIAGNOSIS — R296 Repeated falls: Secondary | ICD-10-CM | POA: Diagnosis not present

## 2019-05-09 DIAGNOSIS — F0150 Vascular dementia without behavioral disturbance: Secondary | ICD-10-CM | POA: Diagnosis not present

## 2019-05-09 DIAGNOSIS — R197 Diarrhea, unspecified: Secondary | ICD-10-CM | POA: Diagnosis not present

## 2019-05-11 DIAGNOSIS — I480 Paroxysmal atrial fibrillation: Secondary | ICD-10-CM | POA: Diagnosis not present

## 2019-05-11 DIAGNOSIS — H6121 Impacted cerumen, right ear: Secondary | ICD-10-CM | POA: Diagnosis not present

## 2019-05-11 DIAGNOSIS — E785 Hyperlipidemia, unspecified: Secondary | ICD-10-CM | POA: Diagnosis not present

## 2019-05-11 DIAGNOSIS — D631 Anemia in chronic kidney disease: Secondary | ICD-10-CM | POA: Diagnosis not present

## 2019-05-11 DIAGNOSIS — I651 Occlusion and stenosis of basilar artery: Secondary | ICD-10-CM | POA: Diagnosis not present

## 2019-05-11 DIAGNOSIS — E559 Vitamin D deficiency, unspecified: Secondary | ICD-10-CM | POA: Diagnosis not present

## 2019-05-11 DIAGNOSIS — I471 Supraventricular tachycardia: Secondary | ICD-10-CM | POA: Diagnosis not present

## 2019-05-11 DIAGNOSIS — Z950 Presence of cardiac pacemaker: Secondary | ICD-10-CM | POA: Diagnosis not present

## 2019-05-11 DIAGNOSIS — I495 Sick sinus syndrome: Secondary | ICD-10-CM | POA: Diagnosis not present

## 2019-05-11 DIAGNOSIS — E039 Hypothyroidism, unspecified: Secondary | ICD-10-CM | POA: Diagnosis not present

## 2019-05-11 DIAGNOSIS — F0150 Vascular dementia without behavioral disturbance: Secondary | ICD-10-CM | POA: Diagnosis not present

## 2019-05-11 DIAGNOSIS — D509 Iron deficiency anemia, unspecified: Secondary | ICD-10-CM | POA: Diagnosis not present

## 2019-05-11 DIAGNOSIS — R269 Unspecified abnormalities of gait and mobility: Secondary | ICD-10-CM | POA: Diagnosis not present

## 2019-05-11 DIAGNOSIS — I5032 Chronic diastolic (congestive) heart failure: Secondary | ICD-10-CM | POA: Diagnosis not present

## 2019-05-11 DIAGNOSIS — Z87440 Personal history of urinary (tract) infections: Secondary | ICD-10-CM | POA: Diagnosis not present

## 2019-05-11 DIAGNOSIS — F329 Major depressive disorder, single episode, unspecified: Secondary | ICD-10-CM | POA: Diagnosis not present

## 2019-05-11 DIAGNOSIS — J45991 Cough variant asthma: Secondary | ICD-10-CM | POA: Diagnosis not present

## 2019-05-11 DIAGNOSIS — M5442 Lumbago with sciatica, left side: Secondary | ICD-10-CM | POA: Diagnosis not present

## 2019-05-11 DIAGNOSIS — Z7901 Long term (current) use of anticoagulants: Secondary | ICD-10-CM | POA: Diagnosis not present

## 2019-05-11 DIAGNOSIS — M8580 Other specified disorders of bone density and structure, unspecified site: Secondary | ICD-10-CM | POA: Diagnosis not present

## 2019-05-11 DIAGNOSIS — N183 Chronic kidney disease, stage 3 unspecified: Secondary | ICD-10-CM | POA: Diagnosis not present

## 2019-05-11 DIAGNOSIS — E7800 Pure hypercholesterolemia, unspecified: Secondary | ICD-10-CM | POA: Diagnosis not present

## 2019-05-11 DIAGNOSIS — G609 Hereditary and idiopathic neuropathy, unspecified: Secondary | ICD-10-CM | POA: Diagnosis not present

## 2019-05-11 DIAGNOSIS — G8929 Other chronic pain: Secondary | ICD-10-CM | POA: Diagnosis not present

## 2019-05-11 DIAGNOSIS — Z7982 Long term (current) use of aspirin: Secondary | ICD-10-CM | POA: Diagnosis not present

## 2019-05-11 DIAGNOSIS — R32 Unspecified urinary incontinence: Secondary | ICD-10-CM | POA: Diagnosis not present

## 2019-05-11 DIAGNOSIS — I130 Hypertensive heart and chronic kidney disease with heart failure and stage 1 through stage 4 chronic kidney disease, or unspecified chronic kidney disease: Secondary | ICD-10-CM | POA: Diagnosis not present

## 2019-05-11 DIAGNOSIS — G2581 Restless legs syndrome: Secondary | ICD-10-CM | POA: Diagnosis not present

## 2019-05-11 DIAGNOSIS — M1711 Unilateral primary osteoarthritis, right knee: Secondary | ICD-10-CM | POA: Diagnosis not present

## 2019-05-16 DIAGNOSIS — R296 Repeated falls: Secondary | ICD-10-CM | POA: Diagnosis not present

## 2019-05-16 DIAGNOSIS — R269 Unspecified abnormalities of gait and mobility: Secondary | ICD-10-CM | POA: Diagnosis not present

## 2019-05-16 DIAGNOSIS — I651 Occlusion and stenosis of basilar artery: Secondary | ICD-10-CM | POA: Diagnosis not present

## 2019-05-16 DIAGNOSIS — R32 Unspecified urinary incontinence: Secondary | ICD-10-CM | POA: Diagnosis not present

## 2019-05-17 DIAGNOSIS — Z8679 Personal history of other diseases of the circulatory system: Secondary | ICD-10-CM | POA: Diagnosis not present

## 2019-05-17 DIAGNOSIS — Z950 Presence of cardiac pacemaker: Secondary | ICD-10-CM | POA: Diagnosis not present

## 2019-05-17 DIAGNOSIS — M19011 Primary osteoarthritis, right shoulder: Secondary | ICD-10-CM | POA: Diagnosis not present

## 2019-05-17 DIAGNOSIS — R2990 Unspecified symptoms and signs involving the nervous system: Secondary | ICD-10-CM | POA: Diagnosis not present

## 2019-05-17 DIAGNOSIS — Z8673 Personal history of transient ischemic attack (TIA), and cerebral infarction without residual deficits: Secondary | ICD-10-CM | POA: Diagnosis not present

## 2019-05-17 DIAGNOSIS — Z7901 Long term (current) use of anticoagulants: Secondary | ICD-10-CM | POA: Diagnosis not present

## 2019-05-17 DIAGNOSIS — Y998 Other external cause status: Secondary | ICD-10-CM | POA: Diagnosis not present

## 2019-05-17 DIAGNOSIS — J45909 Unspecified asthma, uncomplicated: Secondary | ICD-10-CM | POA: Diagnosis not present

## 2019-05-17 DIAGNOSIS — F0150 Vascular dementia without behavioral disturbance: Secondary | ICD-10-CM | POA: Diagnosis not present

## 2019-05-17 DIAGNOSIS — I498 Other specified cardiac arrhythmias: Secondary | ICD-10-CM | POA: Diagnosis not present

## 2019-05-17 DIAGNOSIS — Z79899 Other long term (current) drug therapy: Secondary | ICD-10-CM | POA: Diagnosis not present

## 2019-05-17 DIAGNOSIS — I2510 Atherosclerotic heart disease of native coronary artery without angina pectoris: Secondary | ICD-10-CM | POA: Diagnosis not present

## 2019-05-17 DIAGNOSIS — Z20822 Contact with and (suspected) exposure to covid-19: Secondary | ICD-10-CM | POA: Diagnosis not present

## 2019-05-17 DIAGNOSIS — R29810 Facial weakness: Secondary | ICD-10-CM | POA: Diagnosis not present

## 2019-05-17 DIAGNOSIS — E785 Hyperlipidemia, unspecified: Secondary | ICD-10-CM | POA: Diagnosis not present

## 2019-05-17 DIAGNOSIS — R531 Weakness: Secondary | ICD-10-CM | POA: Diagnosis not present

## 2019-05-17 DIAGNOSIS — I639 Cerebral infarction, unspecified: Secondary | ICD-10-CM | POA: Diagnosis not present

## 2019-05-17 DIAGNOSIS — E039 Hypothyroidism, unspecified: Secondary | ICD-10-CM | POA: Diagnosis not present

## 2019-05-17 DIAGNOSIS — G8194 Hemiplegia, unspecified affecting left nondominant side: Secondary | ICD-10-CM | POA: Diagnosis not present

## 2019-05-17 DIAGNOSIS — I10 Essential (primary) hypertension: Secondary | ICD-10-CM | POA: Diagnosis not present

## 2019-05-17 DIAGNOSIS — I495 Sick sinus syndrome: Secondary | ICD-10-CM | POA: Diagnosis not present

## 2019-05-17 DIAGNOSIS — S0990XA Unspecified injury of head, initial encounter: Secondary | ICD-10-CM | POA: Diagnosis not present

## 2019-05-17 DIAGNOSIS — I4510 Unspecified right bundle-branch block: Secondary | ICD-10-CM | POA: Diagnosis not present

## 2019-05-17 DIAGNOSIS — E876 Hypokalemia: Secondary | ICD-10-CM | POA: Diagnosis not present

## 2019-05-17 DIAGNOSIS — I4891 Unspecified atrial fibrillation: Secondary | ICD-10-CM | POA: Diagnosis not present

## 2019-05-17 DIAGNOSIS — M25511 Pain in right shoulder: Secondary | ICD-10-CM | POA: Diagnosis not present

## 2019-05-17 DIAGNOSIS — W06XXXA Fall from bed, initial encounter: Secondary | ICD-10-CM | POA: Diagnosis not present

## 2019-05-17 DIAGNOSIS — I4589 Other specified conduction disorders: Secondary | ICD-10-CM | POA: Diagnosis not present

## 2019-05-17 DIAGNOSIS — R9082 White matter disease, unspecified: Secondary | ICD-10-CM | POA: Diagnosis not present

## 2019-05-17 DIAGNOSIS — Z7982 Long term (current) use of aspirin: Secondary | ICD-10-CM | POA: Diagnosis not present

## 2019-05-17 DIAGNOSIS — J3489 Other specified disorders of nose and nasal sinuses: Secondary | ICD-10-CM | POA: Diagnosis not present

## 2019-05-17 DIAGNOSIS — D638 Anemia in other chronic diseases classified elsewhere: Secondary | ICD-10-CM | POA: Diagnosis not present

## 2019-05-17 DIAGNOSIS — I651 Occlusion and stenosis of basilar artery: Secondary | ICD-10-CM | POA: Diagnosis not present

## 2019-05-18 DIAGNOSIS — Z950 Presence of cardiac pacemaker: Secondary | ICD-10-CM | POA: Diagnosis not present

## 2019-05-18 DIAGNOSIS — M25511 Pain in right shoulder: Secondary | ICD-10-CM | POA: Diagnosis not present

## 2019-05-18 DIAGNOSIS — R531 Weakness: Secondary | ICD-10-CM | POA: Diagnosis not present

## 2019-05-18 DIAGNOSIS — I4891 Unspecified atrial fibrillation: Secondary | ICD-10-CM | POA: Diagnosis not present

## 2019-05-18 DIAGNOSIS — E785 Hyperlipidemia, unspecified: Secondary | ICD-10-CM | POA: Diagnosis not present

## 2019-05-18 DIAGNOSIS — E876 Hypokalemia: Secondary | ICD-10-CM | POA: Diagnosis not present

## 2019-05-19 DIAGNOSIS — Z950 Presence of cardiac pacemaker: Secondary | ICD-10-CM | POA: Diagnosis not present

## 2019-05-19 DIAGNOSIS — E876 Hypokalemia: Secondary | ICD-10-CM | POA: Diagnosis not present

## 2019-05-19 DIAGNOSIS — I4891 Unspecified atrial fibrillation: Secondary | ICD-10-CM | POA: Diagnosis not present

## 2019-05-19 DIAGNOSIS — G8194 Hemiplegia, unspecified affecting left nondominant side: Secondary | ICD-10-CM | POA: Diagnosis not present

## 2019-05-19 DIAGNOSIS — Z7901 Long term (current) use of anticoagulants: Secondary | ICD-10-CM | POA: Diagnosis not present

## 2019-05-19 DIAGNOSIS — E785 Hyperlipidemia, unspecified: Secondary | ICD-10-CM | POA: Diagnosis not present

## 2019-05-19 DIAGNOSIS — M25511 Pain in right shoulder: Secondary | ICD-10-CM | POA: Diagnosis not present

## 2019-05-19 DIAGNOSIS — Z8673 Personal history of transient ischemic attack (TIA), and cerebral infarction without residual deficits: Secondary | ICD-10-CM | POA: Diagnosis not present

## 2019-05-19 DIAGNOSIS — R531 Weakness: Secondary | ICD-10-CM | POA: Diagnosis not present

## 2019-05-19 DIAGNOSIS — S0990XA Unspecified injury of head, initial encounter: Secondary | ICD-10-CM | POA: Diagnosis not present

## 2019-05-20 DIAGNOSIS — R278 Other lack of coordination: Secondary | ICD-10-CM | POA: Diagnosis not present

## 2019-05-20 DIAGNOSIS — I6389 Other cerebral infarction: Secondary | ICD-10-CM | POA: Diagnosis not present

## 2019-05-20 DIAGNOSIS — I495 Sick sinus syndrome: Secondary | ICD-10-CM | POA: Diagnosis not present

## 2019-05-20 DIAGNOSIS — F0150 Vascular dementia without behavioral disturbance: Secondary | ICD-10-CM | POA: Diagnosis not present

## 2019-05-20 DIAGNOSIS — J45991 Cough variant asthma: Secondary | ICD-10-CM | POA: Diagnosis not present

## 2019-05-20 DIAGNOSIS — E876 Hypokalemia: Secondary | ICD-10-CM | POA: Diagnosis not present

## 2019-05-20 DIAGNOSIS — D508 Other iron deficiency anemias: Secondary | ICD-10-CM | POA: Diagnosis not present

## 2019-05-20 DIAGNOSIS — R41841 Cognitive communication deficit: Secondary | ICD-10-CM | POA: Diagnosis not present

## 2019-05-20 DIAGNOSIS — M6281 Muscle weakness (generalized): Secondary | ICD-10-CM | POA: Diagnosis not present

## 2019-05-20 DIAGNOSIS — E038 Other specified hypothyroidism: Secondary | ICD-10-CM | POA: Diagnosis not present

## 2019-05-20 DIAGNOSIS — I5032 Chronic diastolic (congestive) heart failure: Secondary | ICD-10-CM | POA: Diagnosis not present

## 2019-05-20 DIAGNOSIS — R262 Difficulty in walking, not elsewhere classified: Secondary | ICD-10-CM | POA: Diagnosis not present

## 2019-05-20 DIAGNOSIS — I2510 Atherosclerotic heart disease of native coronary artery without angina pectoris: Secondary | ICD-10-CM | POA: Diagnosis not present

## 2019-05-20 DIAGNOSIS — R2681 Unsteadiness on feet: Secondary | ICD-10-CM | POA: Diagnosis not present

## 2019-05-20 DIAGNOSIS — D631 Anemia in chronic kidney disease: Secondary | ICD-10-CM | POA: Diagnosis not present

## 2019-05-20 DIAGNOSIS — M5442 Lumbago with sciatica, left side: Secondary | ICD-10-CM | POA: Diagnosis not present

## 2019-05-20 DIAGNOSIS — I480 Paroxysmal atrial fibrillation: Secondary | ICD-10-CM | POA: Diagnosis not present

## 2019-05-20 DIAGNOSIS — R269 Unspecified abnormalities of gait and mobility: Secondary | ICD-10-CM | POA: Diagnosis not present

## 2019-05-20 DIAGNOSIS — D638 Anemia in other chronic diseases classified elsewhere: Secondary | ICD-10-CM | POA: Diagnosis not present

## 2019-05-20 DIAGNOSIS — N183 Chronic kidney disease, stage 3 unspecified: Secondary | ICD-10-CM | POA: Diagnosis not present

## 2019-05-20 DIAGNOSIS — I119 Hypertensive heart disease without heart failure: Secondary | ICD-10-CM | POA: Diagnosis not present

## 2019-05-20 DIAGNOSIS — Z950 Presence of cardiac pacemaker: Secondary | ICD-10-CM | POA: Diagnosis not present

## 2019-05-20 DIAGNOSIS — I651 Occlusion and stenosis of basilar artery: Secondary | ICD-10-CM | POA: Diagnosis not present

## 2019-05-20 DIAGNOSIS — R32 Unspecified urinary incontinence: Secondary | ICD-10-CM | POA: Diagnosis not present

## 2019-05-20 DIAGNOSIS — G609 Hereditary and idiopathic neuropathy, unspecified: Secondary | ICD-10-CM | POA: Diagnosis not present

## 2019-05-20 DIAGNOSIS — G8929 Other chronic pain: Secondary | ICD-10-CM | POA: Diagnosis not present

## 2019-05-20 DIAGNOSIS — I130 Hypertensive heart and chronic kidney disease with heart failure and stage 1 through stage 4 chronic kidney disease, or unspecified chronic kidney disease: Secondary | ICD-10-CM | POA: Diagnosis not present

## 2019-05-20 DIAGNOSIS — M1711 Unilateral primary osteoarthritis, right knee: Secondary | ICD-10-CM | POA: Diagnosis not present

## 2019-05-20 DIAGNOSIS — F329 Major depressive disorder, single episode, unspecified: Secondary | ICD-10-CM | POA: Diagnosis not present

## 2019-05-23 DIAGNOSIS — I119 Hypertensive heart disease without heart failure: Secondary | ICD-10-CM | POA: Diagnosis not present

## 2019-05-23 DIAGNOSIS — I495 Sick sinus syndrome: Secondary | ICD-10-CM | POA: Diagnosis not present

## 2019-05-23 DIAGNOSIS — D508 Other iron deficiency anemias: Secondary | ICD-10-CM | POA: Diagnosis not present

## 2019-05-23 DIAGNOSIS — I480 Paroxysmal atrial fibrillation: Secondary | ICD-10-CM | POA: Diagnosis not present

## 2019-05-23 DIAGNOSIS — E038 Other specified hypothyroidism: Secondary | ICD-10-CM | POA: Diagnosis not present

## 2019-05-23 DIAGNOSIS — I6389 Other cerebral infarction: Secondary | ICD-10-CM | POA: Diagnosis not present

## 2019-05-23 DIAGNOSIS — I2510 Atherosclerotic heart disease of native coronary artery without angina pectoris: Secondary | ICD-10-CM | POA: Diagnosis not present

## 2019-05-23 DIAGNOSIS — F0150 Vascular dementia without behavioral disturbance: Secondary | ICD-10-CM | POA: Diagnosis not present

## 2019-05-23 DIAGNOSIS — E876 Hypokalemia: Secondary | ICD-10-CM | POA: Diagnosis not present

## 2019-05-23 DIAGNOSIS — R262 Difficulty in walking, not elsewhere classified: Secondary | ICD-10-CM | POA: Diagnosis not present

## 2019-05-26 DIAGNOSIS — M1711 Unilateral primary osteoarthritis, right knee: Secondary | ICD-10-CM | POA: Diagnosis not present

## 2019-05-26 DIAGNOSIS — M5442 Lumbago with sciatica, left side: Secondary | ICD-10-CM | POA: Diagnosis not present

## 2019-05-26 DIAGNOSIS — I5032 Chronic diastolic (congestive) heart failure: Secondary | ICD-10-CM | POA: Diagnosis not present

## 2019-05-26 DIAGNOSIS — F329 Major depressive disorder, single episode, unspecified: Secondary | ICD-10-CM | POA: Diagnosis not present

## 2019-05-26 DIAGNOSIS — I130 Hypertensive heart and chronic kidney disease with heart failure and stage 1 through stage 4 chronic kidney disease, or unspecified chronic kidney disease: Secondary | ICD-10-CM | POA: Diagnosis not present

## 2019-05-26 DIAGNOSIS — D631 Anemia in chronic kidney disease: Secondary | ICD-10-CM | POA: Diagnosis not present

## 2019-05-26 DIAGNOSIS — G8929 Other chronic pain: Secondary | ICD-10-CM | POA: Diagnosis not present

## 2019-05-26 DIAGNOSIS — G609 Hereditary and idiopathic neuropathy, unspecified: Secondary | ICD-10-CM | POA: Diagnosis not present

## 2019-05-26 DIAGNOSIS — I480 Paroxysmal atrial fibrillation: Secondary | ICD-10-CM | POA: Diagnosis not present

## 2019-05-26 DIAGNOSIS — N183 Chronic kidney disease, stage 3 unspecified: Secondary | ICD-10-CM | POA: Diagnosis not present

## 2019-05-26 DIAGNOSIS — F0150 Vascular dementia without behavioral disturbance: Secondary | ICD-10-CM | POA: Diagnosis not present

## 2019-05-26 DIAGNOSIS — J45991 Cough variant asthma: Secondary | ICD-10-CM | POA: Diagnosis not present

## 2019-06-01 DIAGNOSIS — F0150 Vascular dementia without behavioral disturbance: Secondary | ICD-10-CM | POA: Diagnosis not present

## 2019-06-01 DIAGNOSIS — R262 Difficulty in walking, not elsewhere classified: Secondary | ICD-10-CM | POA: Diagnosis not present

## 2019-06-01 DIAGNOSIS — I480 Paroxysmal atrial fibrillation: Secondary | ICD-10-CM | POA: Diagnosis not present

## 2019-06-01 DIAGNOSIS — I495 Sick sinus syndrome: Secondary | ICD-10-CM | POA: Diagnosis not present

## 2019-06-01 DIAGNOSIS — I119 Hypertensive heart disease without heart failure: Secondary | ICD-10-CM | POA: Diagnosis not present

## 2019-06-06 DIAGNOSIS — I2510 Atherosclerotic heart disease of native coronary artery without angina pectoris: Secondary | ICD-10-CM | POA: Diagnosis not present

## 2019-06-06 DIAGNOSIS — R262 Difficulty in walking, not elsewhere classified: Secondary | ICD-10-CM | POA: Diagnosis not present

## 2019-06-06 DIAGNOSIS — D508 Other iron deficiency anemias: Secondary | ICD-10-CM | POA: Diagnosis not present

## 2019-06-06 DIAGNOSIS — I6389 Other cerebral infarction: Secondary | ICD-10-CM | POA: Diagnosis not present

## 2019-06-06 DIAGNOSIS — I480 Paroxysmal atrial fibrillation: Secondary | ICD-10-CM | POA: Diagnosis not present

## 2019-06-06 DIAGNOSIS — E038 Other specified hypothyroidism: Secondary | ICD-10-CM | POA: Diagnosis not present

## 2019-06-06 DIAGNOSIS — I495 Sick sinus syndrome: Secondary | ICD-10-CM | POA: Diagnosis not present

## 2019-06-06 DIAGNOSIS — F0150 Vascular dementia without behavioral disturbance: Secondary | ICD-10-CM | POA: Diagnosis not present

## 2019-06-09 DIAGNOSIS — E785 Hyperlipidemia, unspecified: Secondary | ICD-10-CM | POA: Diagnosis not present

## 2019-06-09 DIAGNOSIS — I471 Supraventricular tachycardia: Secondary | ICD-10-CM | POA: Diagnosis not present

## 2019-06-09 DIAGNOSIS — D631 Anemia in chronic kidney disease: Secondary | ICD-10-CM | POA: Diagnosis not present

## 2019-06-09 DIAGNOSIS — Z7901 Long term (current) use of anticoagulants: Secondary | ICD-10-CM | POA: Diagnosis not present

## 2019-06-09 DIAGNOSIS — J45909 Unspecified asthma, uncomplicated: Secondary | ICD-10-CM | POA: Diagnosis not present

## 2019-06-09 DIAGNOSIS — N183 Chronic kidney disease, stage 3 unspecified: Secondary | ICD-10-CM | POA: Diagnosis not present

## 2019-06-09 DIAGNOSIS — I480 Paroxysmal atrial fibrillation: Secondary | ICD-10-CM | POA: Diagnosis not present

## 2019-06-09 DIAGNOSIS — R531 Weakness: Secondary | ICD-10-CM | POA: Diagnosis not present

## 2019-06-09 DIAGNOSIS — I495 Sick sinus syndrome: Secondary | ICD-10-CM | POA: Diagnosis not present

## 2019-06-09 DIAGNOSIS — I130 Hypertensive heart and chronic kidney disease with heart failure and stage 1 through stage 4 chronic kidney disease, or unspecified chronic kidney disease: Secondary | ICD-10-CM | POA: Diagnosis not present

## 2019-06-09 DIAGNOSIS — I2510 Atherosclerotic heart disease of native coronary artery without angina pectoris: Secondary | ICD-10-CM | POA: Diagnosis not present

## 2019-06-09 DIAGNOSIS — G8929 Other chronic pain: Secondary | ICD-10-CM | POA: Diagnosis not present

## 2019-06-09 DIAGNOSIS — M5030 Other cervical disc degeneration, unspecified cervical region: Secondary | ICD-10-CM | POA: Diagnosis not present

## 2019-06-09 DIAGNOSIS — I69318 Other symptoms and signs involving cognitive functions following cerebral infarction: Secondary | ICD-10-CM | POA: Diagnosis not present

## 2019-06-09 DIAGNOSIS — E876 Hypokalemia: Secondary | ICD-10-CM | POA: Diagnosis not present

## 2019-06-09 DIAGNOSIS — D509 Iron deficiency anemia, unspecified: Secondary | ICD-10-CM | POA: Diagnosis not present

## 2019-06-09 DIAGNOSIS — E039 Hypothyroidism, unspecified: Secondary | ICD-10-CM | POA: Diagnosis not present

## 2019-06-09 DIAGNOSIS — M5442 Lumbago with sciatica, left side: Secondary | ICD-10-CM | POA: Diagnosis not present

## 2019-06-09 DIAGNOSIS — M8580 Other specified disorders of bone density and structure, unspecified site: Secondary | ICD-10-CM | POA: Diagnosis not present

## 2019-06-09 DIAGNOSIS — Z7982 Long term (current) use of aspirin: Secondary | ICD-10-CM | POA: Diagnosis not present

## 2019-06-09 DIAGNOSIS — F329 Major depressive disorder, single episode, unspecified: Secondary | ICD-10-CM | POA: Diagnosis not present

## 2019-06-09 DIAGNOSIS — I69398 Other sequelae of cerebral infarction: Secondary | ICD-10-CM | POA: Diagnosis not present

## 2019-06-09 DIAGNOSIS — K5900 Constipation, unspecified: Secondary | ICD-10-CM | POA: Diagnosis not present

## 2019-06-09 DIAGNOSIS — I5032 Chronic diastolic (congestive) heart failure: Secondary | ICD-10-CM | POA: Diagnosis not present

## 2019-06-09 DIAGNOSIS — F0150 Vascular dementia without behavioral disturbance: Secondary | ICD-10-CM | POA: Diagnosis not present

## 2019-06-13 DIAGNOSIS — R296 Repeated falls: Secondary | ICD-10-CM | POA: Diagnosis not present

## 2019-06-13 DIAGNOSIS — I651 Occlusion and stenosis of basilar artery: Secondary | ICD-10-CM | POA: Diagnosis not present

## 2019-06-13 DIAGNOSIS — R32 Unspecified urinary incontinence: Secondary | ICD-10-CM | POA: Diagnosis not present

## 2019-06-13 DIAGNOSIS — R269 Unspecified abnormalities of gait and mobility: Secondary | ICD-10-CM | POA: Diagnosis not present

## 2019-06-15 DIAGNOSIS — R413 Other amnesia: Secondary | ICD-10-CM | POA: Diagnosis not present

## 2019-06-15 DIAGNOSIS — R202 Paresthesia of skin: Secondary | ICD-10-CM | POA: Diagnosis not present

## 2019-06-15 DIAGNOSIS — R531 Weakness: Secondary | ICD-10-CM | POA: Diagnosis not present

## 2019-06-16 DIAGNOSIS — I679 Cerebrovascular disease, unspecified: Secondary | ICD-10-CM | POA: Diagnosis not present

## 2019-06-16 DIAGNOSIS — I5032 Chronic diastolic (congestive) heart failure: Secondary | ICD-10-CM | POA: Diagnosis not present

## 2019-06-16 DIAGNOSIS — F0150 Vascular dementia without behavioral disturbance: Secondary | ICD-10-CM | POA: Diagnosis not present

## 2019-06-16 DIAGNOSIS — N183 Chronic kidney disease, stage 3 unspecified: Secondary | ICD-10-CM | POA: Diagnosis not present

## 2019-06-16 DIAGNOSIS — R296 Repeated falls: Secondary | ICD-10-CM | POA: Diagnosis not present

## 2019-06-16 DIAGNOSIS — I130 Hypertensive heart and chronic kidney disease with heart failure and stage 1 through stage 4 chronic kidney disease, or unspecified chronic kidney disease: Secondary | ICD-10-CM | POA: Diagnosis not present

## 2019-06-16 DIAGNOSIS — F3341 Major depressive disorder, recurrent, in partial remission: Secondary | ICD-10-CM | POA: Diagnosis not present

## 2019-06-20 DIAGNOSIS — F329 Major depressive disorder, single episode, unspecified: Secondary | ICD-10-CM | POA: Diagnosis not present

## 2019-06-20 DIAGNOSIS — M5442 Lumbago with sciatica, left side: Secondary | ICD-10-CM | POA: Diagnosis not present

## 2019-06-20 DIAGNOSIS — J45909 Unspecified asthma, uncomplicated: Secondary | ICD-10-CM | POA: Diagnosis not present

## 2019-06-20 DIAGNOSIS — I480 Paroxysmal atrial fibrillation: Secondary | ICD-10-CM | POA: Diagnosis not present

## 2019-06-20 DIAGNOSIS — M8580 Other specified disorders of bone density and structure, unspecified site: Secondary | ICD-10-CM | POA: Diagnosis not present

## 2019-06-20 DIAGNOSIS — D509 Iron deficiency anemia, unspecified: Secondary | ICD-10-CM | POA: Diagnosis not present

## 2019-06-20 DIAGNOSIS — I69398 Other sequelae of cerebral infarction: Secondary | ICD-10-CM | POA: Diagnosis not present

## 2019-06-20 DIAGNOSIS — E785 Hyperlipidemia, unspecified: Secondary | ICD-10-CM | POA: Diagnosis not present

## 2019-06-20 DIAGNOSIS — G8929 Other chronic pain: Secondary | ICD-10-CM | POA: Diagnosis not present

## 2019-06-20 DIAGNOSIS — E876 Hypokalemia: Secondary | ICD-10-CM | POA: Diagnosis not present

## 2019-06-20 DIAGNOSIS — Z7901 Long term (current) use of anticoagulants: Secondary | ICD-10-CM | POA: Diagnosis not present

## 2019-06-20 DIAGNOSIS — N183 Chronic kidney disease, stage 3 unspecified: Secondary | ICD-10-CM | POA: Diagnosis not present

## 2019-06-20 DIAGNOSIS — I2510 Atherosclerotic heart disease of native coronary artery without angina pectoris: Secondary | ICD-10-CM | POA: Diagnosis not present

## 2019-06-20 DIAGNOSIS — I471 Supraventricular tachycardia: Secondary | ICD-10-CM | POA: Diagnosis not present

## 2019-06-20 DIAGNOSIS — E039 Hypothyroidism, unspecified: Secondary | ICD-10-CM | POA: Diagnosis not present

## 2019-06-20 DIAGNOSIS — D631 Anemia in chronic kidney disease: Secondary | ICD-10-CM | POA: Diagnosis not present

## 2019-06-20 DIAGNOSIS — I5032 Chronic diastolic (congestive) heart failure: Secondary | ICD-10-CM | POA: Diagnosis not present

## 2019-06-20 DIAGNOSIS — I69318 Other symptoms and signs involving cognitive functions following cerebral infarction: Secondary | ICD-10-CM | POA: Diagnosis not present

## 2019-06-20 DIAGNOSIS — I495 Sick sinus syndrome: Secondary | ICD-10-CM | POA: Diagnosis not present

## 2019-06-20 DIAGNOSIS — R531 Weakness: Secondary | ICD-10-CM | POA: Diagnosis not present

## 2019-06-20 DIAGNOSIS — I130 Hypertensive heart and chronic kidney disease with heart failure and stage 1 through stage 4 chronic kidney disease, or unspecified chronic kidney disease: Secondary | ICD-10-CM | POA: Diagnosis not present

## 2019-06-20 DIAGNOSIS — F0150 Vascular dementia without behavioral disturbance: Secondary | ICD-10-CM | POA: Diagnosis not present

## 2019-06-20 DIAGNOSIS — K5900 Constipation, unspecified: Secondary | ICD-10-CM | POA: Diagnosis not present

## 2019-06-20 DIAGNOSIS — M5030 Other cervical disc degeneration, unspecified cervical region: Secondary | ICD-10-CM | POA: Diagnosis not present

## 2019-06-20 DIAGNOSIS — Z7982 Long term (current) use of aspirin: Secondary | ICD-10-CM | POA: Diagnosis not present

## 2019-06-23 DIAGNOSIS — I69318 Other symptoms and signs involving cognitive functions following cerebral infarction: Secondary | ICD-10-CM | POA: Diagnosis not present

## 2019-06-23 DIAGNOSIS — I480 Paroxysmal atrial fibrillation: Secondary | ICD-10-CM | POA: Diagnosis not present

## 2019-06-23 DIAGNOSIS — Z7982 Long term (current) use of aspirin: Secondary | ICD-10-CM | POA: Diagnosis not present

## 2019-06-23 DIAGNOSIS — I130 Hypertensive heart and chronic kidney disease with heart failure and stage 1 through stage 4 chronic kidney disease, or unspecified chronic kidney disease: Secondary | ICD-10-CM | POA: Diagnosis not present

## 2019-06-23 DIAGNOSIS — E785 Hyperlipidemia, unspecified: Secondary | ICD-10-CM | POA: Diagnosis not present

## 2019-06-23 DIAGNOSIS — F329 Major depressive disorder, single episode, unspecified: Secondary | ICD-10-CM | POA: Diagnosis not present

## 2019-06-23 DIAGNOSIS — M8580 Other specified disorders of bone density and structure, unspecified site: Secondary | ICD-10-CM | POA: Diagnosis not present

## 2019-06-23 DIAGNOSIS — R531 Weakness: Secondary | ICD-10-CM | POA: Diagnosis not present

## 2019-06-23 DIAGNOSIS — K5900 Constipation, unspecified: Secondary | ICD-10-CM | POA: Diagnosis not present

## 2019-06-23 DIAGNOSIS — I69398 Other sequelae of cerebral infarction: Secondary | ICD-10-CM | POA: Diagnosis not present

## 2019-06-23 DIAGNOSIS — I495 Sick sinus syndrome: Secondary | ICD-10-CM | POA: Diagnosis not present

## 2019-06-23 DIAGNOSIS — F0150 Vascular dementia without behavioral disturbance: Secondary | ICD-10-CM | POA: Diagnosis not present

## 2019-06-23 DIAGNOSIS — N183 Chronic kidney disease, stage 3 unspecified: Secondary | ICD-10-CM | POA: Diagnosis not present

## 2019-06-23 DIAGNOSIS — M5442 Lumbago with sciatica, left side: Secondary | ICD-10-CM | POA: Diagnosis not present

## 2019-06-23 DIAGNOSIS — E876 Hypokalemia: Secondary | ICD-10-CM | POA: Diagnosis not present

## 2019-06-23 DIAGNOSIS — E039 Hypothyroidism, unspecified: Secondary | ICD-10-CM | POA: Diagnosis not present

## 2019-06-23 DIAGNOSIS — G8929 Other chronic pain: Secondary | ICD-10-CM | POA: Diagnosis not present

## 2019-06-23 DIAGNOSIS — M5030 Other cervical disc degeneration, unspecified cervical region: Secondary | ICD-10-CM | POA: Diagnosis not present

## 2019-06-23 DIAGNOSIS — D509 Iron deficiency anemia, unspecified: Secondary | ICD-10-CM | POA: Diagnosis not present

## 2019-06-23 DIAGNOSIS — I471 Supraventricular tachycardia: Secondary | ICD-10-CM | POA: Diagnosis not present

## 2019-06-23 DIAGNOSIS — I5032 Chronic diastolic (congestive) heart failure: Secondary | ICD-10-CM | POA: Diagnosis not present

## 2019-06-23 DIAGNOSIS — J45909 Unspecified asthma, uncomplicated: Secondary | ICD-10-CM | POA: Diagnosis not present

## 2019-06-23 DIAGNOSIS — Z7901 Long term (current) use of anticoagulants: Secondary | ICD-10-CM | POA: Diagnosis not present

## 2019-06-23 DIAGNOSIS — D631 Anemia in chronic kidney disease: Secondary | ICD-10-CM | POA: Diagnosis not present

## 2019-06-23 DIAGNOSIS — I2510 Atherosclerotic heart disease of native coronary artery without angina pectoris: Secondary | ICD-10-CM | POA: Diagnosis not present

## 2019-07-09 DIAGNOSIS — I651 Occlusion and stenosis of basilar artery: Secondary | ICD-10-CM | POA: Diagnosis not present

## 2019-07-09 DIAGNOSIS — R32 Unspecified urinary incontinence: Secondary | ICD-10-CM | POA: Diagnosis not present

## 2019-07-09 DIAGNOSIS — F0150 Vascular dementia without behavioral disturbance: Secondary | ICD-10-CM | POA: Diagnosis not present

## 2019-07-09 DIAGNOSIS — R269 Unspecified abnormalities of gait and mobility: Secondary | ICD-10-CM | POA: Diagnosis not present

## 2019-07-11 DIAGNOSIS — E039 Hypothyroidism, unspecified: Secondary | ICD-10-CM | POA: Diagnosis not present

## 2019-07-11 DIAGNOSIS — J45909 Unspecified asthma, uncomplicated: Secondary | ICD-10-CM | POA: Diagnosis not present

## 2019-07-11 DIAGNOSIS — I5032 Chronic diastolic (congestive) heart failure: Secondary | ICD-10-CM | POA: Diagnosis not present

## 2019-07-11 DIAGNOSIS — F329 Major depressive disorder, single episode, unspecified: Secondary | ICD-10-CM | POA: Diagnosis not present

## 2019-07-11 DIAGNOSIS — Z7982 Long term (current) use of aspirin: Secondary | ICD-10-CM | POA: Diagnosis not present

## 2019-07-11 DIAGNOSIS — M8580 Other specified disorders of bone density and structure, unspecified site: Secondary | ICD-10-CM | POA: Diagnosis not present

## 2019-07-11 DIAGNOSIS — I69398 Other sequelae of cerebral infarction: Secondary | ICD-10-CM | POA: Diagnosis not present

## 2019-07-11 DIAGNOSIS — I480 Paroxysmal atrial fibrillation: Secondary | ICD-10-CM | POA: Diagnosis not present

## 2019-07-11 DIAGNOSIS — I69318 Other symptoms and signs involving cognitive functions following cerebral infarction: Secondary | ICD-10-CM | POA: Diagnosis not present

## 2019-07-11 DIAGNOSIS — E785 Hyperlipidemia, unspecified: Secondary | ICD-10-CM | POA: Diagnosis not present

## 2019-07-11 DIAGNOSIS — Z7901 Long term (current) use of anticoagulants: Secondary | ICD-10-CM | POA: Diagnosis not present

## 2019-07-11 DIAGNOSIS — R531 Weakness: Secondary | ICD-10-CM | POA: Diagnosis not present

## 2019-07-11 DIAGNOSIS — I471 Supraventricular tachycardia: Secondary | ICD-10-CM | POA: Diagnosis not present

## 2019-07-11 DIAGNOSIS — M5030 Other cervical disc degeneration, unspecified cervical region: Secondary | ICD-10-CM | POA: Diagnosis not present

## 2019-07-11 DIAGNOSIS — N183 Chronic kidney disease, stage 3 unspecified: Secondary | ICD-10-CM | POA: Diagnosis not present

## 2019-07-11 DIAGNOSIS — D509 Iron deficiency anemia, unspecified: Secondary | ICD-10-CM | POA: Diagnosis not present

## 2019-07-11 DIAGNOSIS — F0150 Vascular dementia without behavioral disturbance: Secondary | ICD-10-CM | POA: Diagnosis not present

## 2019-07-11 DIAGNOSIS — M5442 Lumbago with sciatica, left side: Secondary | ICD-10-CM | POA: Diagnosis not present

## 2019-07-11 DIAGNOSIS — K5900 Constipation, unspecified: Secondary | ICD-10-CM | POA: Diagnosis not present

## 2019-07-11 DIAGNOSIS — D631 Anemia in chronic kidney disease: Secondary | ICD-10-CM | POA: Diagnosis not present

## 2019-07-11 DIAGNOSIS — I2510 Atherosclerotic heart disease of native coronary artery without angina pectoris: Secondary | ICD-10-CM | POA: Diagnosis not present

## 2019-07-11 DIAGNOSIS — G8929 Other chronic pain: Secondary | ICD-10-CM | POA: Diagnosis not present

## 2019-07-11 DIAGNOSIS — I495 Sick sinus syndrome: Secondary | ICD-10-CM | POA: Diagnosis not present

## 2019-07-11 DIAGNOSIS — I130 Hypertensive heart and chronic kidney disease with heart failure and stage 1 through stage 4 chronic kidney disease, or unspecified chronic kidney disease: Secondary | ICD-10-CM | POA: Diagnosis not present

## 2019-07-11 DIAGNOSIS — E876 Hypokalemia: Secondary | ICD-10-CM | POA: Diagnosis not present

## 2019-07-14 DIAGNOSIS — R296 Repeated falls: Secondary | ICD-10-CM | POA: Diagnosis not present

## 2019-07-14 DIAGNOSIS — I651 Occlusion and stenosis of basilar artery: Secondary | ICD-10-CM | POA: Diagnosis not present

## 2019-07-14 DIAGNOSIS — R32 Unspecified urinary incontinence: Secondary | ICD-10-CM | POA: Diagnosis not present

## 2019-07-14 DIAGNOSIS — R269 Unspecified abnormalities of gait and mobility: Secondary | ICD-10-CM | POA: Diagnosis not present

## 2019-07-17 DIAGNOSIS — H53483 Generalized contraction of visual field, bilateral: Secondary | ICD-10-CM | POA: Diagnosis not present

## 2019-07-17 DIAGNOSIS — H35033 Hypertensive retinopathy, bilateral: Secondary | ICD-10-CM | POA: Diagnosis not present

## 2019-07-17 DIAGNOSIS — H04123 Dry eye syndrome of bilateral lacrimal glands: Secondary | ICD-10-CM | POA: Diagnosis not present

## 2019-07-17 DIAGNOSIS — H33199 Other retinoschisis and retinal cysts, unspecified eye: Secondary | ICD-10-CM | POA: Diagnosis not present

## 2019-07-18 DIAGNOSIS — F0150 Vascular dementia without behavioral disturbance: Secondary | ICD-10-CM | POA: Diagnosis not present

## 2019-07-18 DIAGNOSIS — R296 Repeated falls: Secondary | ICD-10-CM | POA: Diagnosis not present

## 2019-07-18 DIAGNOSIS — R1114 Bilious vomiting: Secondary | ICD-10-CM | POA: Diagnosis not present

## 2019-07-22 DIAGNOSIS — K5900 Constipation, unspecified: Secondary | ICD-10-CM | POA: Diagnosis not present

## 2019-07-22 DIAGNOSIS — Z7901 Long term (current) use of anticoagulants: Secondary | ICD-10-CM | POA: Diagnosis not present

## 2019-07-22 DIAGNOSIS — I471 Supraventricular tachycardia: Secondary | ICD-10-CM | POA: Diagnosis not present

## 2019-07-22 DIAGNOSIS — I69318 Other symptoms and signs involving cognitive functions following cerebral infarction: Secondary | ICD-10-CM | POA: Diagnosis not present

## 2019-07-22 DIAGNOSIS — E876 Hypokalemia: Secondary | ICD-10-CM | POA: Diagnosis not present

## 2019-07-22 DIAGNOSIS — G8929 Other chronic pain: Secondary | ICD-10-CM | POA: Diagnosis not present

## 2019-07-22 DIAGNOSIS — M5442 Lumbago with sciatica, left side: Secondary | ICD-10-CM | POA: Diagnosis not present

## 2019-07-22 DIAGNOSIS — I2510 Atherosclerotic heart disease of native coronary artery without angina pectoris: Secondary | ICD-10-CM | POA: Diagnosis not present

## 2019-07-22 DIAGNOSIS — R531 Weakness: Secondary | ICD-10-CM | POA: Diagnosis not present

## 2019-07-22 DIAGNOSIS — F329 Major depressive disorder, single episode, unspecified: Secondary | ICD-10-CM | POA: Diagnosis not present

## 2019-07-22 DIAGNOSIS — Z7982 Long term (current) use of aspirin: Secondary | ICD-10-CM | POA: Diagnosis not present

## 2019-07-22 DIAGNOSIS — D631 Anemia in chronic kidney disease: Secondary | ICD-10-CM | POA: Diagnosis not present

## 2019-07-22 DIAGNOSIS — I480 Paroxysmal atrial fibrillation: Secondary | ICD-10-CM | POA: Diagnosis not present

## 2019-07-22 DIAGNOSIS — M8580 Other specified disorders of bone density and structure, unspecified site: Secondary | ICD-10-CM | POA: Diagnosis not present

## 2019-07-22 DIAGNOSIS — F0150 Vascular dementia without behavioral disturbance: Secondary | ICD-10-CM | POA: Diagnosis not present

## 2019-07-22 DIAGNOSIS — I69398 Other sequelae of cerebral infarction: Secondary | ICD-10-CM | POA: Diagnosis not present

## 2019-07-22 DIAGNOSIS — E039 Hypothyroidism, unspecified: Secondary | ICD-10-CM | POA: Diagnosis not present

## 2019-07-22 DIAGNOSIS — M5030 Other cervical disc degeneration, unspecified cervical region: Secondary | ICD-10-CM | POA: Diagnosis not present

## 2019-07-22 DIAGNOSIS — I130 Hypertensive heart and chronic kidney disease with heart failure and stage 1 through stage 4 chronic kidney disease, or unspecified chronic kidney disease: Secondary | ICD-10-CM | POA: Diagnosis not present

## 2019-07-22 DIAGNOSIS — I495 Sick sinus syndrome: Secondary | ICD-10-CM | POA: Diagnosis not present

## 2019-07-22 DIAGNOSIS — J45909 Unspecified asthma, uncomplicated: Secondary | ICD-10-CM | POA: Diagnosis not present

## 2019-07-22 DIAGNOSIS — N183 Chronic kidney disease, stage 3 unspecified: Secondary | ICD-10-CM | POA: Diagnosis not present

## 2019-07-22 DIAGNOSIS — D509 Iron deficiency anemia, unspecified: Secondary | ICD-10-CM | POA: Diagnosis not present

## 2019-07-22 DIAGNOSIS — I5032 Chronic diastolic (congestive) heart failure: Secondary | ICD-10-CM | POA: Diagnosis not present

## 2019-07-22 DIAGNOSIS — E785 Hyperlipidemia, unspecified: Secondary | ICD-10-CM | POA: Diagnosis not present

## 2019-07-27 DIAGNOSIS — R531 Weakness: Secondary | ICD-10-CM | POA: Diagnosis not present

## 2019-07-27 DIAGNOSIS — R519 Headache, unspecified: Secondary | ICD-10-CM | POA: Diagnosis not present

## 2019-07-30 ENCOUNTER — Other Ambulatory Visit: Payer: Self-pay

## 2019-07-30 ENCOUNTER — Emergency Department (HOSPITAL_BASED_OUTPATIENT_CLINIC_OR_DEPARTMENT_OTHER): Payer: PPO

## 2019-07-30 ENCOUNTER — Encounter (HOSPITAL_BASED_OUTPATIENT_CLINIC_OR_DEPARTMENT_OTHER): Payer: Self-pay | Admitting: *Deleted

## 2019-07-30 ENCOUNTER — Emergency Department (HOSPITAL_BASED_OUTPATIENT_CLINIC_OR_DEPARTMENT_OTHER)
Admission: EM | Admit: 2019-07-30 | Discharge: 2019-07-31 | Disposition: A | Discharge status: Home / Self Care | Payer: PPO | Attending: Internal Medicine | Admitting: Internal Medicine

## 2019-07-30 DIAGNOSIS — Z20822 Contact with and (suspected) exposure to covid-19: Secondary | ICD-10-CM | POA: Diagnosis not present

## 2019-07-30 DIAGNOSIS — R1110 Vomiting, unspecified: Secondary | ICD-10-CM | POA: Diagnosis present

## 2019-07-30 DIAGNOSIS — N390 Urinary tract infection, site not specified: Secondary | ICD-10-CM | POA: Diagnosis present

## 2019-07-30 DIAGNOSIS — Z7901 Long term (current) use of anticoagulants: Secondary | ICD-10-CM | POA: Diagnosis not present

## 2019-07-30 DIAGNOSIS — Z950 Presence of cardiac pacemaker: Secondary | ICD-10-CM | POA: Diagnosis not present

## 2019-07-30 DIAGNOSIS — E876 Hypokalemia: Secondary | ICD-10-CM | POA: Diagnosis not present

## 2019-07-30 DIAGNOSIS — K8020 Calculus of gallbladder without cholecystitis without obstruction: Secondary | ICD-10-CM | POA: Diagnosis not present

## 2019-07-30 DIAGNOSIS — Z79899 Other long term (current) drug therapy: Secondary | ICD-10-CM | POA: Diagnosis not present

## 2019-07-30 DIAGNOSIS — Z8673 Personal history of transient ischemic attack (TIA), and cerebral infarction without residual deficits: Secondary | ICD-10-CM | POA: Diagnosis not present

## 2019-07-30 DIAGNOSIS — E039 Hypothyroidism, unspecified: Secondary | ICD-10-CM | POA: Diagnosis not present

## 2019-07-30 DIAGNOSIS — I10 Essential (primary) hypertension: Secondary | ICD-10-CM | POA: Diagnosis not present

## 2019-07-30 DIAGNOSIS — R112 Nausea with vomiting, unspecified: Secondary | ICD-10-CM | POA: Diagnosis not present

## 2019-07-30 DIAGNOSIS — R197 Diarrhea, unspecified: Secondary | ICD-10-CM | POA: Insufficient documentation

## 2019-07-30 DIAGNOSIS — N200 Calculus of kidney: Secondary | ICD-10-CM | POA: Diagnosis not present

## 2019-07-30 DIAGNOSIS — Z79890 Hormone replacement therapy (postmenopausal): Secondary | ICD-10-CM | POA: Diagnosis not present

## 2019-07-30 LAB — COMPREHENSIVE METABOLIC PANEL
ALT: 184 U/L — ABNORMAL HIGH (ref 0–44)
AST: 212 U/L — ABNORMAL HIGH (ref 15–41)
Albumin: 3.4 g/dL — ABNORMAL LOW (ref 3.5–5.0)
Alkaline Phosphatase: 99 U/L (ref 38–126)
Anion gap: 11 (ref 5–15)
BUN: 16 mg/dL (ref 8–23)
CO2: 27 mmol/L (ref 22–32)
Calcium: 9 mg/dL (ref 8.9–10.3)
Chloride: 102 mmol/L (ref 98–111)
Creatinine, Ser: 1.66 mg/dL — ABNORMAL HIGH (ref 0.44–1.00)
GFR calc Af Amer: 31 mL/min — ABNORMAL LOW (ref >60)
GFR calc non Af Amer: 27 mL/min — ABNORMAL LOW (ref >60)
Glucose, Bld: 109 mg/dL — ABNORMAL HIGH (ref 70–99)
Potassium: 2.7 mmol/L — CL (ref 3.5–5.1)
Sodium: 140 mmol/L (ref 135–145)
Total Bilirubin: 0.9 mg/dL (ref 0.3–1.2)
Total Protein: 6.9 g/dL (ref 6.5–8.1)

## 2019-07-30 LAB — CBC WITH DIFFERENTIAL/PLATELET
Abs Immature Granulocytes: 0.01 10*3/uL (ref 0.00–0.07)
Basophils Absolute: 0 10*3/uL (ref 0.0–0.1)
Basophils Relative: 0 %
Eosinophils Absolute: 0.1 10*3/uL (ref 0.0–0.5)
Eosinophils Relative: 1 %
HCT: 36.5 % (ref 36.0–46.0)
Hemoglobin: 12.5 g/dL (ref 12.0–15.0)
Immature Granulocytes: 0 %
Lymphocytes Relative: 28 %
Lymphs Abs: 1.8 10*3/uL (ref 0.7–4.0)
MCH: 29.4 pg (ref 26.0–34.0)
MCHC: 34.2 g/dL (ref 30.0–36.0)
MCV: 85.9 fL (ref 80.0–100.0)
Monocytes Absolute: 0.7 10*3/uL (ref 0.1–1.0)
Monocytes Relative: 11 %
Neutro Abs: 3.8 10*3/uL (ref 1.7–7.7)
Neutrophils Relative %: 60 %
Platelets: 246 10*3/uL (ref 150–400)
RBC: 4.25 MIL/uL (ref 3.87–5.11)
RDW: 15.6 % — ABNORMAL HIGH (ref 11.5–15.5)
WBC: 6.4 10*3/uL (ref 4.0–10.5)
nRBC: 0 % (ref 0.0–0.2)

## 2019-07-30 LAB — LIPASE, BLOOD: Lipase: 20 U/L (ref 11–51)

## 2019-07-30 LAB — RESPIRATORY PANEL BY RT PCR (FLU A&B, COVID)
Influenza A by PCR: NEGATIVE
Influenza B by PCR: NEGATIVE
SARS Coronavirus 2 by RT PCR: NEGATIVE

## 2019-07-30 MED ORDER — VALSARTAN-HYDROCHLOROTHIAZIDE 160-12.5 MG PO TABS: 1.0000 | ORAL_TABLET | Freq: Every day | ORAL | Status: DC

## 2019-07-30 MED ORDER — DILTIAZEM HCL ER COATED BEADS 120 MG PO CP24: 120.0000 mg | ORAL_CAPSULE | Freq: Every day | ORAL | Status: DC

## 2019-07-30 MED ORDER — MONTELUKAST SODIUM 10 MG PO TABS
10.0000 mg | ORAL_TABLET | Freq: Every day | ORAL | Status: DC
  Filled 2019-07-30: qty 1

## 2019-07-30 MED ORDER — LEVOTHYROXINE SODIUM 88 MCG PO TABS
88.0000 ug | ORAL_TABLET | Freq: Every day | ORAL | Status: DC
  Filled 2019-07-30: qty 1

## 2019-07-30 MED ORDER — SODIUM CHLORIDE 0.9 % IV SOLN
1.0000 g | Freq: Once | INTRAVENOUS | Status: AC
  Administered 2019-07-30: 1 g via INTRAVENOUS
  Filled 2019-07-30: qty 10

## 2019-07-30 MED ORDER — ATORVASTATIN CALCIUM 10 MG PO TABS: 10.0000 mg | ORAL_TABLET | Freq: Every day | ORAL | Status: DC

## 2019-07-30 MED ORDER — DILTIAZEM HCL ER COATED BEADS 120 MG PO CP24: 120.0000 mg | ORAL_CAPSULE | Freq: Two times a day (BID) | ORAL | Status: DC

## 2019-07-30 MED ORDER — GABAPENTIN 300 MG PO CAPS
400.0000 mg | ORAL_CAPSULE | Freq: Every day | ORAL | Status: DC
  Administered 2019-07-31: 400 mg via ORAL
  Filled 2019-07-30: qty 1

## 2019-07-30 MED ORDER — APIXABAN 2.5 MG PO TABS
2.5000 mg | ORAL_TABLET | Freq: Two times a day (BID) | ORAL | Status: DC
  Administered 2019-07-31: 2.5 mg via ORAL
  Filled 2019-07-30: qty 1

## 2019-07-30 MED ORDER — POTASSIUM CHLORIDE CRYS ER 20 MEQ PO TBCR
40.0000 meq | EXTENDED_RELEASE_TABLET | Freq: Once | ORAL | Status: AC
  Administered 2019-07-30: 40 meq via ORAL
  Filled 2019-07-30: qty 2

## 2019-07-30 MED ORDER — POTASSIUM CHLORIDE 10 MEQ/100ML IV SOLN
10.0000 meq | INTRAVENOUS | Status: AC
  Administered 2019-07-30 (×2): 10 meq via INTRAVENOUS
  Filled 2019-07-30 (×2): qty 100

## 2019-07-30 MED ORDER — SERTRALINE HCL 25 MG PO TABS: 50.0000 mg | ORAL_TABLET | Freq: Every day | ORAL | Status: DC

## 2019-07-30 NOTE — ED Notes (Signed)
Date and time results received: 07/30/19 1737   Test: potassium Critical Value: 2.7  Name of Provider Notified: [place]  Orders Received? Or Actions Taken?: no orders given

## 2019-07-30 NOTE — ED Triage Notes (Signed)
Pt presents with dry heaves and diarrhea since yesterday. States she feels weak. Her niece is with her and states pt had an episode of diarrhea just pta

## 2019-07-30 NOTE — ED Provider Notes (Signed)
[facility] EMERGENCY DEPARTMENT Provider Note   CSN: [id number] Arrival date & time: 07/30/19  1534     History Chief Complaint  Patient presents with  . Emesis  . Diarrhea    Jade Juarez is a 86 y.o. female.  Patient is a 86 year old female with a history of dementia, atrial fibrillation on Eliquis, GERD, hypertension, hyperlipidemia who presents with vomiting and diarrhea.  Daughter states that she has been having dry heaves every morning for about 4 months.  Michela Pitcher it usually last about 30 minutes and then goes away for the rest the day.  She had some yesterday is normal.  Today she woke up about 4 AM with dry heaves and this is continued throughout the day.  She has not down keep anything down.  She also has had watery diarrhea.  She denies any abdominal pain.  No fevers.  No cough or cold symptoms.  She denies any significant dizziness.  No current headaches.        Past Medical History:  Diagnosis Date  . Anemia    takes Ferrous Sulfate daily  . Anxiety   . Arthritis    right knees  . Atrial fibrillation ([place])    prescribed Eliquis but hasn't started-waiting until after this procedure per [place]  . Depression    takes Zoloft daily  . Diverticulitis    hx of  . Frequent UTI   . GERD (gastroesophageal reflux disease)    hx of-yrs ago  . Headache    occasionally  . Heart murmur   . High cholesterol    takes Atorvastatin daily  . History of bronchitis    about a yr ago  . History of colon polyps   . Hypertension    takes Diltiazem and Diovan daily  . Hypothyroidism    takes Synthroid daily  . IBS (irritable bowel syndrome)    PMH  . Incontinence   . Joint pain   . Joint swelling   . Nocturia   . Overactive bladder   . PONV (postoperative nausea and vomiting)   . Presence of permanent cardiac pacemaker   . Restless leg syndrome   . Status post placement of implantable loop recorder 05/2014  . Stool incontinence    at times  . Stroke [facility])      takes Plavix daily-right sided weakness  . Vertebrobasilar artery insufficiency   . Wears glasses     Patient Active Problem List   Diagnosis Date Noted  . UTI (urinary tract infection) 07/30/2019  . Basilar artery stenosis 11/14/2014  . Occlusion and stenosis of basilar artery     Past Surgical History:  Procedure Laterality Date  . ABDOMINAL HYSTERECTOMY    . bladder tack     x 2  . BREAST SURGERY Right    cyst removed  . COLONOSCOPY    . EYE SURGERY Bilateral    cataract surgery  . INSERT / REPLACE / REMOVE PACEMAKER    . LOOP RECORDER IMPLANT  05/23/2014   [employer] Reveal loop recorder (Dr. Lisa Roca, [facility])  . RADIOLOGY WITH ANESTHESIA N/A 08/15/2014   Procedure: RADIOLOGY WITH ANESTHESIA;  Surgeon: Luanne Bras, MD;  Location: [facility];  Service: Radiology;  Laterality: N/A;  . RADIOLOGY WITH ANESTHESIA N/A 11/05/2014   Procedure: ANGIOPLASTY WITH POSSIBLE STENTING      (RADIOLOGY WITH ANESTHESIA );  Surgeon: Luanne Bras, MD;  Location: [facility];  Service: Radiology;  Laterality: N/A;  . RADIOLOGY WITH ANESTHESIA N/A 11/14/2014  Procedure: RADIOLOGY WITH ANESTHESIA;  Surgeon: Luanne Bras, MD;  Location: [facility];  Service: Radiology;  Laterality: N/A;     OB History   No obstetric history on file.     Family History  Problem Relation Age of Onset  . Hypertension Mother   . Heart attack Mother   . Cancer Father     Social History   Tobacco Use  . Smoking status: Never Smoker  . Smokeless tobacco: Never Used  Substance Use Topics  . Alcohol use: No  . Drug use: No    Home Medications Prior to Admission medications   Medication Sig Start Date End Date Taking? Authorizing Provider  apixaban (ELIQUIS) 2.5 MG TABS tablet Take 2.5 mg by mouth 2 (two) times daily.   Yes [provider]  atorvastatin (LIPITOR) 10 MG tablet Take 10 mg by mouth daily.   Yes [provider]  calcium carbonate (OS-CAL) 600 MG TABS tablet Take 600 mg by  mouth daily. 2 tabs   Yes [provider]  Cholecalciferol (VITAMIN D3) 5000 UNITS CAPS Take 5,000 Units by mouth daily.   Yes [provider]  diltiazem (CARDIZEM CD) 120 MG 24 hr capsule Take 120 mg by mouth 2 (two) times daily.    Yes [provider]  ferrous sulfate 325 (65 FE) MG tablet Take 325 mg by mouth 2 (two) times a week. Takes on Monday & Wedensday   Yes [provider]  gabapentin (NEURONTIN) 400 MG capsule Take 400 mg by mouth at bedtime.    Yes [provider]  glucosamine-chondroitin 500-400 MG tablet Take 1 tablet by mouth daily.   Yes [provider]  levothyroxine (SYNTHROID, LEVOTHROID) 88 MCG tablet Take 88 mcg by mouth daily before breakfast.   Yes [provider]  montelukast (SINGULAIR) 10 MG tablet Take 10 mg by mouth at bedtime.   Yes [provider]  Multiple Vitamin (MULTIVITAMIN) tablet Take 1 tablet by mouth daily.   Yes [provider]  sertraline (ZOLOFT) 50 MG tablet Take 50 mg by mouth daily.   Yes [provider]  valsartan-hydrochlorothiazide (DIOVAN-HCT) 160-12.5 MG per tablet Take 1 tablet by mouth daily.   Yes [provider]  amoxicillin (AMOXIL) 500 MG capsule Take 2,000 mg by mouth See admin instructions. 1 hour before dental procedure 06/17/14   [provider]  cephALEXin (KEFLEX) 500 MG capsule Take 1 capsule (500 mg total) by mouth 2 (two) times daily. 04/14/19   Quintella Reichert, MD  conjugated estrogens (PREMARIN) vaginal cream Place 1 Applicatorful vaginally every other day.    [provider]  diclofenac sodium (VOLTAREN) 1 % GEL Apply 2 g topically 2 (two) times daily.    [provider]  ticagrelor (BRILINTA) 90 MG TABS tablet Take 45-90 mg by mouth See admin instructions. 11/06/14-11/11/14=90 mg twice a day 11/12/14=90 mg daily 11/13/14=45 mg twice a day 11/14/14=45 mg in the morning... Unknowing dosing thereafter    [provider]    Allergies    Hibiclens [chlorhexidine gluconate], Benicar [olmesartan], Codeine, Erythromycin, Lipitor [atorvastatin], Other, Tetracyclines & related, Vibramycin [doxycycline], and Vicodin [hydrocodone-acetaminophen]  Review of Systems   Review of Systems  Constitutional: Positive for fatigue. Negative for chills, diaphoresis and fever.  HENT: Negative for congestion, rhinorrhea and sneezing.   Eyes: Negative.   Respiratory: Negative for cough, chest tightness and shortness of breath.   Cardiovascular: Negative for chest pain and leg swelling.  Gastrointestinal: Positive for diarrhea, nausea and  vomiting. Negative for abdominal pain and blood in stool.  Genitourinary: Negative for difficulty urinating, flank pain, frequency and hematuria.  Musculoskeletal: Negative for arthralgias and back pain.  Skin: Negative for rash.  Neurological: Negative for dizziness, speech difficulty, weakness, numbness and headaches.    Physical Exam Updated Vital Signs BP (!) 176/126 (BP Location: Right Arm)   Pulse 73   Temp 97.7 F (36.5 C) (Tympanic)   Resp 16   Ht 5\' 2"  (1.575 m)   Wt 68 kg   SpO2 99%   BMI 27.44 kg/m   Physical Exam Constitutional:      Appearance: She is well-developed.  HENT:     Head: Normocephalic and atraumatic.  Eyes:     Pupils: Pupils are equal, round, and reactive to light.  Cardiovascular:     Rate and Rhythm: Normal rate and regular rhythm.     Heart sounds: Normal heart sounds.  Pulmonary:     Effort: Pulmonary effort is normal. No respiratory distress.     Breath sounds: Normal breath sounds. No wheezing or rales.  Chest:     Chest wall: No tenderness.  Abdominal:     General: Bowel sounds are normal.     Palpations: Abdomen is soft.     Tenderness: There is no abdominal tenderness. There is no guarding or rebound.  Musculoskeletal:        General: Normal range of motion.     Cervical back: Normal range of motion and neck supple.    Lymphadenopathy:     Cervical: No cervical adenopathy.  Skin:    General: Skin is warm and dry.     Findings: No rash.  Neurological:     Mental Status: She is alert and oriented to person, place, and time.     ED Results / Procedures / Treatments   Labs (all labs ordered are listed, but only abnormal results are displayed) Labs Reviewed  COMPREHENSIVE METABOLIC PANEL - Abnormal; Notable for the following components:      Result Value   Potassium 2.7 (*)    Glucose, Bld 109 (*)    Creatinine, Ser 1.66 (*)    Albumin 3.4 (*)    AST 212 (*)    ALT 184 (*)    GFR calc non Af Amer 27 (*)    GFR calc Af Amer 31 (*)    All other components within normal limits  CBC WITH DIFFERENTIAL/PLATELET - Abnormal; Notable for the following components:   RDW 15.6 (*)    All other components within normal limits  RESPIRATORY PANEL BY RT PCR (FLU A&B, COVID)  URINE CULTURE  LIPASE, BLOOD  URINALYSIS, ROUTINE W REFLEX MICROSCOPIC    EKG None  Radiology CT ABDOMEN PELVIS WO CONTRAST  Result Date: 07/30/2019 CLINICAL DATA:  86 year old female with acute abdominal pain and nausea/vomiting. EXAM: CT ABDOMEN AND PELVIS WITHOUT CONTRAST TECHNIQUE: Multidetector CT imaging of the abdomen and pelvis was performed following the standard protocol without IV contrast. COMPARISON:  07/07/2017 CT FINDINGS: Please note that parenchymal abnormalities may be missed without intravenous contrast. Lower chest: No acute abnormality. Hepatobiliary: No significant hepatic abnormalities are identified. Hepatic cysts are again noted. Probable cholelithiasis identified. No definite pericholecystic inflammation. No biliary dilatation. Pancreas: Unremarkable Spleen: Unremarkable Adrenals/Urinary Tract: An unchanged 2 cm heterogeneous mass extending off the lateral mid RIGHT kidney is indeterminate. A few punctate nonobstructing bilateral renal calculi are identified. There is no evidence of hydronephrosis. The adrenal  glands and bladder are unremarkable. Stomach/Bowel:  Stomach is within normal limits. No evidence of bowel wall thickening, distention, or inflammatory changes. Vascular/Lymphatic: Aortic atherosclerosis. No enlarged abdominal or pelvic lymph nodes. Reproductive: Status post hysterectomy. No adnexal masses. Other: No ascites, focal collection or pneumoperitoneum. Musculoskeletal: No acute or suspicious bony abnormalities. Degenerative changes in the lumbar spine again noted. IMPRESSION: 1. No evidence of acute abnormality. 2. Unchanged 2 cm indeterminate RIGHT renal mass from 2019. 3. Punctate nonobstructing bilateral renal calculi. 4. Aortic Atherosclerosis (ICD10-I70.0). Electronically Signed   By: Margarette Canada M.D.   On: 07/30/2019 18:16   US Abdomen Limited RUQ  Result Date: 07/30/2019 CLINICAL DATA:  Dry heaves and diarrhea for 1 week, cholelithiasis EXAM: ULTRASOUND ABDOMEN LIMITED RIGHT UPPER QUADRANT COMPARISON:  07/30/2019 FINDINGS: Gallbladder: Wall-echo-shadow complex is seen within the gallbladder consistent with numerous shadowing gallstones. There is no gallbladder wall thickening or pericholecystic fluid. Common bile duct: Diameter: 4 mm Liver: No focal lesion identified. Within normal limits in parenchymal echogenicity. Portal vein is patent on color Doppler imaging with normal direction of blood flow towards the liver. Other: Incidental 1.6 x 1.4 x 1.3 cm hypoechoic structure upper pole right kidney, favor cyst. This corresponds to the CT finding. IMPRESSION: 1. Cholelithiasis without cholecystitis. 2. Right renal cyst. Electronically Signed   By: Randa Ngo M.D.   On: 07/30/2019 20:38    Procedures Procedures (including critical care time)  Medications Ordered in ED Medications  cefTRIAXone (ROCEPHIN) 1 g in sodium chloride 0.9 % 100 mL IVPB (has no administration in time range)  potassium chloride 10 mEq in 100 mL IVPB (0 mEq Intravenous Stopped 07/30/19 2009)    ED Course  I  have reviewed the triage vital signs and the nursing notes.  Pertinent labs & imaging results that were available during my care of the patient were reviewed by me and considered in my medical decision making (see chart for details).    MDM Rules/Calculators/A&P                      Patient is a 86 year old female who presents with vomiting and diarrhea.  She has had some recurrent vomiting in the mornings for about 2 months.  Today she has been vomiting all day and had some associated diarrhea.  She does not have associated abdominal pain.  No fevers.  Her CT scan shows no acute abnormality.  She does have some gallstones but no evidence of inflammatory changes.  I did ultrasound of her gallbladder which shows gallstones but no evidence of cholecystitis.  Her family member who is at bedside says that she did have cholecystitis about 2 years ago and had a drain placed at that point because he did not want to operate.  She does have some elevation in her LFTs.  She has had some elevation in her LFTs over the last year and.  They are a little more elevated today.  I do not know that her vomiting today is related to gallbladder disease.  She is not tender over her gallbladder.  She does not have any suggestions of infection.  No evidence of pancreatitis.  Her potassium is markedly low.  She was started on IV and oral potassium replacement in the ED.  She also has evidence of UTI and was given IV antibiotics for this.  Given her markedly low potassium and ongoing symptoms, I spoke with Dr. Marlowe Sax who will admit the patient for further treatment.  Patient request to go to [facility] versus [facility]  long. Final Clinical Impression(s) / ED Diagnoses Final diagnoses:  Vomiting  Hypokalemia  Urinary tract infection without hematuria, site unspecified    Rx / DC Orders ED Discharge Orders    None       Malvin Johns, MD 07/30/19 2204

## 2019-07-30 NOTE — ED Notes (Signed)
US at the bedside

## 2019-07-31 LAB — URINALYSIS, ROUTINE W REFLEX MICROSCOPIC
Bilirubin Urine: NEGATIVE
Glucose, UA: NEGATIVE mg/dL
Ketones, ur: NEGATIVE mg/dL
Leukocytes,Ua: NEGATIVE
Nitrite: POSITIVE — AB
Protein, ur: NEGATIVE mg/dL
Specific Gravity, Urine: 1.015 (ref 1.005–1.030)
pH: 6.5 (ref 5.0–8.0)

## 2019-07-31 LAB — URINALYSIS, MICROSCOPIC (REFLEX)

## 2019-07-31 LAB — URINE CULTURE: Culture: <10000 — AB

## 2019-07-31 LAB — BASIC METABOLIC PANEL
Anion gap: 9 (ref 5–15)
BUN: 15 mg/dL (ref 8–23)
CO2: 25 mmol/L (ref 22–32)
Calcium: 8.3 mg/dL — ABNORMAL LOW (ref 8.9–10.3)
Chloride: 105 mmol/L (ref 98–111)
Creatinine, Ser: 1.55 mg/dL — ABNORMAL HIGH (ref 0.44–1.00)
GFR calc Af Amer: 34 mL/min — ABNORMAL LOW (ref >60)
GFR calc non Af Amer: 29 mL/min — ABNORMAL LOW (ref >60)
Glucose, Bld: 103 mg/dL — ABNORMAL HIGH (ref 70–99)
Potassium: 2.7 mmol/L — CL (ref 3.5–5.1)
Sodium: 139 mmol/L (ref 135–145)

## 2019-07-31 LAB — POTASSIUM: Potassium: 3.4 mmol/L — ABNORMAL LOW (ref 3.5–5.1)

## 2019-07-31 MED ORDER — ONDANSETRON HCL 4 MG/2ML IJ SOLN
4.0000 mg | Freq: Once | INTRAMUSCULAR | Status: AC
  Administered 2019-07-31: 4 mg via INTRAVENOUS
  Filled 2019-07-31: qty 2

## 2019-07-31 MED ORDER — CEPHALEXIN 250 MG PO CAPS: 250.0000 mg | ORAL_CAPSULE | Freq: Three times a day (TID) | ORAL | 0 refills | Status: AC

## 2019-07-31 MED ORDER — MAGNESIUM SULFATE 2 GM/50ML IV SOLN
2.0000 g | Freq: Once | INTRAVENOUS | Status: AC
  Administered 2019-07-31: 2 g via INTRAVENOUS
  Filled 2019-07-31: qty 50

## 2019-07-31 MED ORDER — POTASSIUM CHLORIDE 10 MEQ/100ML IV SOLN
10.0000 meq | Freq: Once | INTRAVENOUS | Status: AC
  Administered 2019-07-31: 10 meq via INTRAVENOUS
  Filled 2019-07-31: qty 100

## 2019-07-31 MED ORDER — POTASSIUM CHLORIDE CRYS ER 20 MEQ PO TBCR: 20.0000 meq | EXTENDED_RELEASE_TABLET | Freq: Two times a day (BID) | ORAL | 0 refills | Status: AC

## 2019-07-31 NOTE — Discharge Instructions (Addendum)
You were evaluated in the Emergency Department and after careful evaluation, we did not find any emergent condition requiring admission or further testing in the hospital.  Your exam/testing today was overall reassuring.  Please take the Keflex antibiotic as directed.  It is important that you continue to hydrate at home with plenty of fluids.  Please also take the potassium pills as directed.  We recommend repeat evaluation by a physician and possible repeat blood testing within the next 3 to 5 days.  Please return to the Emergency Department if you experience any worsening of your condition.  We encourage you to follow up with a primary care provider.  Thank you for allowing [place] to be a part of your care.

## 2019-07-31 NOTE — ED Notes (Signed)
Pt given applesauce and positioned in bed for comfort. Family at bedside.

## 2019-07-31 NOTE — ED Notes (Signed)
K is 2.7. Dr. Dina Rich aware.

## 2019-07-31 NOTE — ED Provider Notes (Signed)
1:06 AM  Repeat potassium 2.7 after 2 rounds rounds of potassium and oral potassium.  Additional round of potassium ordered in addition to IV magnesium.   Merryl Hacker, MD 07/31/19 [phone]

## 2019-07-31 NOTE — ED Notes (Signed)
Patient's niece, Hilda Blades, at bedside.  Patient stated that she is comfortable.  Denies pain.  Morning med list given to charge nurse to be ordered from [facility].  Cecilie Lowers, Pharmacist, called and stated that patient can have this medicine when she gets transferred to [facility].   He stated that Singulair is given at [facility], Synthroid has a longer life time and it is okay to be given when she gets transferred and the Diovan is given at [facility].  All this meds is scheduled at 10 am.

## 2019-07-31 NOTE — ED Provider Notes (Signed)
  Provider Note MRN:  [id number]  Arrival date & time: 07/31/19    ED Course and Medical Decision Making  Assumed care from Dr. Dina Rich at shift change.  Patient admitted for nausea, vomiting, diarrhea, UTI, hypokalemia.  It appears that all of these issues are resolved.  Potassium is corrected to 3.4 this morning.  Patient has not had any vomiting or diarrhea for several hours.  Eating this morning without issue.  No longer with indication for admission, both patient and patient's daughter desired discharge.  Advised close follow-up, continue hydration.  Procedures  Final Clinical Impressions(s) / ED Diagnoses     ICD-10-CM   1. Hypokalemia  E87.6   2. Vomiting  R11.10 US Abdomen Limited RUQ    US Abdomen Limited RUQ  3. Urinary tract infection without hematuria, site unspecified  N39.0     ED Discharge Orders         Ordered    potassium chloride SA (KLOR-CON) 20 MEQ tablet  2 times daily     07/31/19 0903    cephALEXin (KEFLEX) 250 MG capsule  3 times daily     07/31/19 [id number]            Discharge Instructions     You were evaluated in the Emergency Department and after careful evaluation, we did not find any emergent condition requiring admission or further testing in the hospital.  Your exam/testing today was overall reassuring.  Please take the Keflex antibiotic as directed.  It is important that you continue to hydrate at home with plenty of fluids.  Please also take the potassium pills as directed.  We recommend repeat evaluation by a physician and possible repeat blood testing within the next 3 to 5 days.  Please return to the Emergency Department if you experience any worsening of your condition.  We encourage you to follow up with a primary care provider.  Thank you for allowing [place] to be a part of your care.     Barth Kirks. [place]onia Small, [place] [email or web address]    Maudie Flakes, MD 07/31/19 [phone]

## 2019-08-04 ENCOUNTER — Emergency Department (HOSPITAL_BASED_OUTPATIENT_CLINIC_OR_DEPARTMENT_OTHER)
Admission: EM | Admit: 2019-08-04 | Discharge: 2019-08-05 | Disposition: A | Discharge status: Home / Self Care | Payer: PPO | Attending: Emergency Medicine | Admitting: Emergency Medicine

## 2019-08-04 ENCOUNTER — Other Ambulatory Visit: Payer: Self-pay

## 2019-08-04 ENCOUNTER — Emergency Department (HOSPITAL_BASED_OUTPATIENT_CLINIC_OR_DEPARTMENT_OTHER): Payer: PPO

## 2019-08-04 ENCOUNTER — Encounter (HOSPITAL_BASED_OUTPATIENT_CLINIC_OR_DEPARTMENT_OTHER): Payer: Self-pay | Admitting: *Deleted

## 2019-08-04 DIAGNOSIS — Z8673 Personal history of transient ischemic attack (TIA), and cerebral infarction without residual deficits: Secondary | ICD-10-CM | POA: Diagnosis not present

## 2019-08-04 DIAGNOSIS — E039 Hypothyroidism, unspecified: Secondary | ICD-10-CM | POA: Diagnosis not present

## 2019-08-04 DIAGNOSIS — R778 Other specified abnormalities of plasma proteins: Secondary | ICD-10-CM | POA: Diagnosis not present

## 2019-08-04 DIAGNOSIS — I10 Essential (primary) hypertension: Secondary | ICD-10-CM | POA: Insufficient documentation

## 2019-08-04 DIAGNOSIS — Z7901 Long term (current) use of anticoagulants: Secondary | ICD-10-CM | POA: Diagnosis not present

## 2019-08-04 DIAGNOSIS — N2889 Other specified disorders of kidney and ureter: Secondary | ICD-10-CM | POA: Diagnosis not present

## 2019-08-04 DIAGNOSIS — R112 Nausea with vomiting, unspecified: Secondary | ICD-10-CM | POA: Diagnosis not present

## 2019-08-04 DIAGNOSIS — Z79899 Other long term (current) drug therapy: Secondary | ICD-10-CM | POA: Insufficient documentation

## 2019-08-04 LAB — CBC WITH DIFFERENTIAL/PLATELET
Abs Immature Granulocytes: 0.02 10*3/uL (ref 0.00–0.07)
Basophils Absolute: 0 10*3/uL (ref 0.0–0.1)
Basophils Relative: 0 %
Eosinophils Absolute: 0.2 10*3/uL (ref 0.0–0.5)
Eosinophils Relative: 2 %
HCT: 33.6 % — ABNORMAL LOW (ref 36.0–46.0)
Hemoglobin: 11.3 g/dL — ABNORMAL LOW (ref 12.0–15.0)
Immature Granulocytes: 0 %
Lymphocytes Relative: 28 %
Lymphs Abs: 2 10*3/uL (ref 0.7–4.0)
MCH: 29.5 pg (ref 26.0–34.0)
MCHC: 33.6 g/dL (ref 30.0–36.0)
MCV: 87.7 fL (ref 80.0–100.0)
Monocytes Absolute: 0.7 10*3/uL (ref 0.1–1.0)
Monocytes Relative: 10 %
Neutro Abs: 4.1 10*3/uL (ref 1.7–7.7)
Neutrophils Relative %: 60 %
Platelets: 208 10*3/uL (ref 150–400)
RBC: 3.83 MIL/uL — ABNORMAL LOW (ref 3.87–5.11)
RDW: 16.3 % — ABNORMAL HIGH (ref 11.5–15.5)
WBC: 6.9 10*3/uL (ref 4.0–10.5)
nRBC: 0 % (ref 0.0–0.2)

## 2019-08-04 LAB — COMPREHENSIVE METABOLIC PANEL
ALT: 119 U/L — ABNORMAL HIGH (ref 0–44)
AST: 111 U/L — ABNORMAL HIGH (ref 15–41)
Albumin: 2.9 g/dL — ABNORMAL LOW (ref 3.5–5.0)
Alkaline Phosphatase: 123 U/L (ref 38–126)
Anion gap: 9 (ref 5–15)
BUN: 16 mg/dL (ref 8–23)
CO2: 26 mmol/L (ref 22–32)
Calcium: 8.6 mg/dL — ABNORMAL LOW (ref 8.9–10.3)
Chloride: 106 mmol/L (ref 98–111)
Creatinine, Ser: 1.36 mg/dL — ABNORMAL HIGH (ref 0.44–1.00)
GFR calc Af Amer: 40 mL/min — ABNORMAL LOW (ref >60)
GFR calc non Af Amer: 34 mL/min — ABNORMAL LOW (ref >60)
Glucose, Bld: 101 mg/dL — ABNORMAL HIGH (ref 70–99)
Potassium: 3.1 mmol/L — ABNORMAL LOW (ref 3.5–5.1)
Sodium: 141 mmol/L (ref 135–145)
Total Bilirubin: 0.6 mg/dL (ref 0.3–1.2)
Total Protein: 6.6 g/dL (ref 6.5–8.1)

## 2019-08-04 LAB — URINALYSIS, ROUTINE W REFLEX MICROSCOPIC
Bilirubin Urine: NEGATIVE
Glucose, UA: NEGATIVE mg/dL
Hgb urine dipstick: NEGATIVE
Ketones, ur: NEGATIVE mg/dL
Leukocytes,Ua: NEGATIVE
Nitrite: NEGATIVE
Protein, ur: NEGATIVE mg/dL
Specific Gravity, Urine: 1.01 (ref 1.005–1.030)
pH: 6.5 (ref 5.0–8.0)

## 2019-08-04 LAB — TROPONIN I (HIGH SENSITIVITY)
Troponin I (High Sensitivity): 96 ng/L — ABNORMAL HIGH (ref <18)
Troponin I (High Sensitivity): 106 ng/L (ref <18)

## 2019-08-04 MED ORDER — ONDANSETRON 4 MG PO TBDP: 4.0000 mg | ORAL_TABLET | Freq: Three times a day (TID) | ORAL | 0 refills | Status: AC | PRN

## 2019-08-04 MED ORDER — SODIUM CHLORIDE 0.9 % IV BOLUS
1000.0000 mL | Freq: Once | INTRAVENOUS | Status: AC
  Administered 2019-08-04: 20:00:00 1000 mL via INTRAVENOUS

## 2019-08-04 MED ORDER — ONDANSETRON HCL 4 MG/2ML IJ SOLN
4.0000 mg | Freq: Once | INTRAMUSCULAR | Status: AC
  Administered 2019-08-04: 4 mg via INTRAVENOUS
  Filled 2019-08-04: qty 2

## 2019-08-04 MED ORDER — IOHEXOL 300 MG/ML  SOLN
75.0000 mL | Freq: Once | INTRAMUSCULAR | Status: AC | PRN
  Administered 2019-08-04: 21:00:00 75 mL via INTRAVENOUS

## 2019-08-04 MED ORDER — ONDANSETRON HCL 4 MG/2ML IJ SOLN
4.0000 mg | Freq: Once | INTRAMUSCULAR | Status: AC
  Administered 2019-08-05: 4 mg via INTRAVENOUS
  Filled 2019-08-04: qty 2

## 2019-08-04 MED ORDER — POTASSIUM CHLORIDE CRYS ER 20 MEQ PO TBCR
40.0000 meq | EXTENDED_RELEASE_TABLET | Freq: Once | ORAL | Status: AC
  Administered 2019-08-05: 40 meq via ORAL
  Filled 2019-08-04: qty 2

## 2019-08-04 NOTE — ED Provider Notes (Signed)
[facility] EMERGENCY DEPARTMENT Provider Note   CSN: [id number] Arrival date & time: 08/04/19  1840     History Chief Complaint  Patient presents with  . Emesis    Jade Juarez is a 86 y.o. female.  HPI     Nausea and vomiting for about 1 week Came to the ED 4/25 with plan for admission for hydration, hypokalemia Had CT done without acute findings UA possible UTI Have not been taking zofran at home Has been taking abx for UTI  Had diarrhea Sunday, that has improved No fever No urinary symptoms No abdominal pain, has nausea, especially after drinking something  Not throwing up, just severe nausea No chest pain Pain right flank/RUQ for 2 weeks, constant  Has been able to eat a little bit and drink a little  Past Medical History:  Diagnosis Date  . Anemia    takes Ferrous Sulfate daily  . Anxiety   . Arthritis    right knees  . Atrial fibrillation ([place])    prescribed Eliquis but hasn't started-waiting until after this procedure per [place]  . Depression    takes Zoloft daily  . Diverticulitis    hx of  . Frequent UTI   . GERD (gastroesophageal reflux disease)    hx of-yrs ago  . Headache    occasionally  . Heart murmur   . High cholesterol    takes Atorvastatin daily  . History of bronchitis    about a yr ago  . History of colon polyps   . Hypertension    takes Diltiazem and Diovan daily  . Hypothyroidism    takes Synthroid daily  . IBS (irritable bowel syndrome)    PMH  . Incontinence   . Joint pain   . Joint swelling   . Nocturia   . Overactive bladder   . PONV (postoperative nausea and vomiting)   . Presence of permanent cardiac pacemaker   . Restless leg syndrome   . Status post placement of implantable loop recorder 05/2014  . Stool incontinence    at times  . Stroke [facility])    takes Plavix daily-right sided weakness  . Vertebrobasilar artery insufficiency   . Wears glasses     Patient Active Problem List   Diagnosis  Date Noted  . UTI (urinary tract infection) 07/30/2019  . Basilar artery stenosis 11/14/2014  . Occlusion and stenosis of basilar artery     Past Surgical History:  Procedure Laterality Date  . ABDOMINAL HYSTERECTOMY    . bladder tack     x 2  . BREAST SURGERY Right    cyst removed  . COLONOSCOPY    . EYE SURGERY Bilateral    cataract surgery  . INSERT / REPLACE / REMOVE PACEMAKER    . LOOP RECORDER IMPLANT  05/23/2014   Medtronic Reveal loop recorder (Dr. Lisa Roca, [facility])  . RADIOLOGY WITH ANESTHESIA N/A 08/15/2014   Procedure: RADIOLOGY WITH ANESTHESIA;  Surgeon: Luanne Bras, MD;  Location: [place];  Service: Radiology;  Laterality: N/A;  . RADIOLOGY WITH ANESTHESIA N/A 11/05/2014   Procedure: ANGIOPLASTY WITH POSSIBLE STENTING      (RADIOLOGY WITH ANESTHESIA );  Surgeon: Luanne Bras, MD;  Location: [place];  Service: Radiology;  Laterality: N/A;  . RADIOLOGY WITH ANESTHESIA N/A 11/14/2014   Procedure: RADIOLOGY WITH ANESTHESIA;  Surgeon: Luanne Bras, MD;  Location: [place];  Service: Radiology;  Laterality: N/A;     OB History   No obstetric history on file.  Family History  Problem Relation Age of Onset  . Hypertension Mother   . Heart attack Mother   . Cancer Father     Social History   Tobacco Use  . Smoking status: Never Smoker  . Smokeless tobacco: Never Used  Substance Use Topics  . Alcohol use: No  . Drug use: No    Home Medications Prior to Admission medications   Medication Sig Start Date End Date Taking? Authorizing Provider  amoxicillin (AMOXIL) 500 MG capsule Take 2,000 mg by mouth See admin instructions. 1 hour before dental procedure 06/17/14   [provider]  apixaban (ELIQUIS) 2.5 MG TABS tablet Take 2.5 mg by mouth 2 (two) times daily.    [provider]  atorvastatin (LIPITOR) 10 MG tablet Take 10 mg by mouth daily.    [provider]  calcium carbonate (OS-CAL) 600 MG TABS tablet Take 600 mg by mouth  daily. 2 tabs    [provider]  cephALEXin (KEFLEX) 250 MG capsule Take 1 capsule (250 mg total) by mouth 3 (three) times daily for 7 days. 07/31/19 08/07/19  Maudie Flakes, MD  Cholecalciferol (VITAMIN D3) 5000 UNITS CAPS Take 5,000 Units by mouth daily.    [provider]  conjugated estrogens (PREMARIN) vaginal cream Place 1 Applicatorful vaginally every other day.    [provider]  diclofenac sodium (VOLTAREN) 1 % GEL Apply 2 g topically 2 (two) times daily.    [provider]  diltiazem (CARDIZEM CD) 120 MG 24 hr capsule Take 120 mg by mouth 2 (two) times daily.     [provider]  ferrous sulfate 325 (65 FE) MG tablet Take 325 mg by mouth 2 (two) times a week. Takes on Monday & Wedensday    [provider]  gabapentin (NEURONTIN) 400 MG capsule Take 400 mg by mouth at bedtime.     [provider]  glucosamine-chondroitin 500-400 MG tablet Take 1 tablet by mouth daily.    [provider]  levothyroxine (SYNTHROID, LEVOTHROID) 88 MCG tablet Take 88 mcg by mouth daily before breakfast.    [provider]  montelukast (SINGULAIR) 10 MG tablet Take 10 mg by mouth at bedtime.    [provider]  Multiple Vitamin (MULTIVITAMIN) tablet Take 1 tablet by mouth daily.    [provider]  ondansetron (ZOFRAN ODT) 4 MG disintegrating tablet Take 1 tablet (4 mg total) by mouth every 8 (eight) hours as needed for nausea or vomiting. 08/04/19   Gareth Morgan, MD  potassium chloride SA (KLOR-CON) 20 MEQ tablet Take 1 tablet (20 mEq total) by mouth 2 (two) times daily for 3 days. 07/31/19 08/03/19  Maudie Flakes, MD  sertraline (ZOLOFT) 50 MG tablet Take 50 mg by mouth daily.    [provider]  ticagrelor (BRILINTA) 90 MG TABS tablet Take 45-90 mg by mouth See admin instructions. 11/06/14-11/11/14=90 mg twice a day 11/12/14=90 mg daily 11/13/14=45 mg twice a day 11/14/14=45 mg in the morning... Unknowing  dosing thereafter    [provider]  valsartan-hydrochlorothiazide (DIOVAN-HCT) 160-12.5 MG per tablet Take 1 tablet by mouth daily.    [provider]    Allergies    Hibiclens [chlorhexidine gluconate], Benicar [olmesartan], Codeine, Erythromycin, Lipitor [atorvastatin], Other, Tetracyclines & related, Vibramycin [doxycycline], and Vicodin [hydrocodone-acetaminophen]  Review of Systems   Review of Systems  Constitutional: Negative for diaphoresis and fever.  HENT: Negative for sore throat.   Eyes: Negative for visual disturbance.  Respiratory:  Negative for cough and shortness of breath.   Cardiovascular: Negative for chest pain.  Gastrointestinal: Positive for nausea. Negative for abdominal pain, constipation and diarrhea.  Genitourinary: Negative for difficulty urinating.  Musculoskeletal: Negative for back pain and neck pain.  Skin: Negative for rash.  Neurological: Positive for light-headedness. Negative for syncope and headaches.    Physical Exam Updated Vital Signs BP (!) 142/92   Pulse 65   Temp 98.6 F (37 C) (Oral)   Resp (!) 22   Ht 5\' 2"  (1.575 m)   Wt 68 kg   SpO2 100%   BMI 27.42 kg/m   Physical Exam Vitals and nursing note reviewed.  Constitutional:      General: She is not in acute distress.    Appearance: She is well-developed. She is not diaphoretic.  HENT:     Head: Normocephalic and atraumatic.  Eyes:     Conjunctiva/sclera: Conjunctivae normal.  Cardiovascular:     Rate and Rhythm: Normal rate and regular rhythm.     Heart sounds: Normal heart sounds.  Pulmonary:     Effort: Pulmonary effort is normal. No respiratory distress.     Breath sounds: Normal breath sounds.  Abdominal:     General: There is no distension.     Palpations: Abdomen is soft.     Tenderness: There is abdominal tenderness (RLQ). There is no guarding.     Comments: Negative murphy   Musculoskeletal:        General: No tenderness.     Cervical back:  Normal range of motion.  Skin:    General: Skin is warm and dry.     Findings: No erythema or rash.  Neurological:     Mental Status: She is alert and oriented to person, place, and time.     ED Results / Procedures / Treatments   Labs (all labs ordered are listed, but only abnormal results are displayed) Labs Reviewed  CBC WITH DIFFERENTIAL/PLATELET - Abnormal; Notable for the following components:      Result Value   RBC 3.83 (*)    Hemoglobin 11.3 (*)    HCT 33.6 (*)    RDW 16.3 (*)    All other components within normal limits  COMPREHENSIVE METABOLIC PANEL - Abnormal; Notable for the following components:   Potassium 3.1 (*)    Glucose, Bld 101 (*)    Creatinine, Ser 1.36 (*)    Calcium 8.6 (*)    Albumin 2.9 (*)    AST 111 (*)    ALT 119 (*)    GFR calc non Af Amer 34 (*)    GFR calc Af Amer 40 (*)    All other components within normal limits  TROPONIN I (HIGH SENSITIVITY) - Abnormal; Notable for the following components:   Troponin I (High Sensitivity) 96 (*)    All other components within normal limits  TROPONIN I (HIGH SENSITIVITY) - Abnormal; Notable for the following components:   Troponin I (High Sensitivity) 106 (*)    All other components within normal limits  URINE CULTURE  URINALYSIS, ROUTINE W REFLEX MICROSCOPIC    EKG EKG Interpretation  Date/Time:  Friday August 04 2019 19:33:42 EDT Ventricular Rate:  70 PR Interval:    QRS Duration: 146 QT Interval:  482 QTC Calculation: 521 R Axis:   65 Text Interpretation: Sinus rhythm Right bundle branch block No significant change since last tracing Confirmed by Gareth Morgan [phone]) on 08/04/2019 9:26:42 PM   Radiology CT ABDOMEN PELVIS W  CONTRAST  Result Date: 08/04/2019 CLINICAL DATA:  Left lower quadrant pain for several days EXAM: CT ABDOMEN AND PELVIS WITH CONTRAST TECHNIQUE: Multidetector CT imaging of the abdomen and pelvis was performed using the standard protocol following bolus administration  of intravenous contrast. CONTRAST:  10mL OMNIPAQUE IOHEXOL 300 MG/ML  SOLN COMPARISON:  07/30/2019 FINDINGS: Lower chest: Lung bases are free of acute infiltrate or sizable effusion. Hepatobiliary: Scattered cysts are noted throughout the liver similar to that seen on the prior exam. Gallbladder is well distended and stable. Previously seen gallstone is not well visualized. Pancreas: Unremarkable. No pancreatic ductal dilatation or surrounding inflammatory changes. Spleen: Normal in size without focal abnormality. Adrenals/Urinary Tract: Adrenal glands are within normal limits bilaterally. Previously seen heterogeneous mass lesion in the right kidney is again identified laterally. It demonstrates enhancement and is suspicious for underlying neoplasm. No renal calculi are noted. Stable right renal cyst in the lower pole is noted. Normal excretion of contrast is seen bilaterally. The bladder is partially distended. Stomach/Bowel: No obstructive or inflammatory changes of large or small bowel are noted. The stomach is within normal limits. The appendix is not well visualized. No inflammatory changes to suggest appendicitis are seen. Vascular/Lymphatic: Vascular calcifications are noted within the aorta. Multiple ovarian vein phleboliths are noted. No lymphadenopathy is seen. Reproductive: Status post hysterectomy. No adnexal masses. Other: No abdominal wall hernia or abnormality. No abdominopelvic ascites. Musculoskeletal: Degenerative changes of the lumbar spine are noted. IMPRESSION: The appendix is not well visualized although no inflammatory changes to suggest appendicitis are noted. Known gallstones are not well appreciated on this exam. Persistent right-sided mass lesion in the kidney with enhancement suspicious for underlying neoplasm. This has been stable over multiple previous exams. Follow-up can be performed as necessary given the patient's age. No new focal abnormality is noted. Electronically Signed   By:  Inez Catalina M.D.   On: 08/04/2019 21:09    Procedures Procedures (including critical care time)  Medications Ordered in ED Medications  potassium chloride SA (KLOR-CON) CR tablet 40 mEq (has no administration in time range)  ondansetron (ZOFRAN) injection 4 mg (has no administration in time range)  sodium chloride 0.9 % bolus 1,000 mL ( Intravenous Stopped 08/04/19 2111)  ondansetron (ZOFRAN) injection 4 mg (4 mg Intravenous Given 08/04/19 1945)  iohexol (OMNIPAQUE) 300 MG/ML solution 75 mL (75 mLs Intravenous Contrast Given 08/04/19 2041)    ED Course  I have reviewed the triage vital signs and the nursing notes.  Pertinent labs & imaging results that were available during my care of the patient were reviewed by me and considered in my medical decision making (see chart for details).    MDM Rules/Calculators/A&P                      Sivani Bulnes is a very pleasant 86 year old female with history of atrial fibrillation, hypertension, hypothyroidism, hypercholesterolemia, restless leg, vertebrobasilar artery insufficiency, CVA, who presents with concern for continuing nausea.  She had been seen in the emergency department for the same 5 days ago, with initial plan for admission, however had improvement while awaiting admission while in the emergency department and was discharged.  At that time had a CT without contrast that shows no acute abnormalities, RUQ US shows cholelithiasis without cholecystits.  Today, CT was repeated with contrast showing no acute findings. Discussed perisistent right sided renal mass that has been stable over last 2 years.  Do not feel exam is consistent with cholecystitis or  that symptoms are secondary to cholelithiasis.  UA today without signs of infection and noted that culture from days ago did not show significant infection.    Given Ms. Chana is an older female with nausea and epigastric discomfort, I did order troponin and ECG for evaluation of possible  cardiac ischemia as underlying cause of nausea.  Troponin 96 and repeat relatively stable at 106.  Discussed that given the clinical history and relatively stable troponin elevation it is difficult to rule in or rule out ACS and recommended admission for continued monitoring.  It is possible her nausea is secondary to ischemia, however she also had diarrhea earlier this week and viral etiology is on the differential (as well as cholelithiasis although less likely without significant pain.)  I had a long discussion with Jade Juarez and her family regarding possible admission to the hospital. She states "I would rather die at home than die in the hospital." She understands the risks of possible ongoing cardiac ischemia, however would like to go home with nausea control.  Discussed she is welcome to return at any time for further evaluation and treatment. Given po K and zofran. Nausea improved in the ED with fluids and zofran. Labs show mild hypokalemia, improved renal function in comparison to prior.  Discharged after shared decision making discussion and determination that hospitalization tonight is not within her goals of care with inclusion of her brother at bedside and daughter-in-law on the phone.      Final Clinical Impression(s) / ED Diagnoses Final diagnoses:  Nausea and vomiting, intractability of vomiting not specified, unspecified vomiting type  Elevated troponin  Renal mass    Rx / DC Orders ED Discharge Orders         Ordered    ondansetron (ZOFRAN ODT) 4 MG disintegrating tablet  Every 8 hours PRN     08/04/19 2357           Gareth Morgan, MD 08/05/19 [phone]

## 2019-08-04 NOTE — ED Triage Notes (Signed)
C/o vomiting. Abdominal pain.

## 2019-08-06 LAB — URINE CULTURE: Culture: NO GROWTH

## 2019-08-07 DIAGNOSIS — I2510 Atherosclerotic heart disease of native coronary artery without angina pectoris: Secondary | ICD-10-CM | POA: Diagnosis not present

## 2019-08-07 DIAGNOSIS — E039 Hypothyroidism, unspecified: Secondary | ICD-10-CM | POA: Diagnosis not present

## 2019-08-07 DIAGNOSIS — Z7982 Long term (current) use of aspirin: Secondary | ICD-10-CM | POA: Diagnosis not present

## 2019-08-07 DIAGNOSIS — M5030 Other cervical disc degeneration, unspecified cervical region: Secondary | ICD-10-CM | POA: Diagnosis not present

## 2019-08-07 DIAGNOSIS — N183 Chronic kidney disease, stage 3 unspecified: Secondary | ICD-10-CM | POA: Diagnosis not present

## 2019-08-07 DIAGNOSIS — F329 Major depressive disorder, single episode, unspecified: Secondary | ICD-10-CM | POA: Diagnosis not present

## 2019-08-07 DIAGNOSIS — J45909 Unspecified asthma, uncomplicated: Secondary | ICD-10-CM | POA: Diagnosis not present

## 2019-08-07 DIAGNOSIS — I69398 Other sequelae of cerebral infarction: Secondary | ICD-10-CM | POA: Diagnosis not present

## 2019-08-07 DIAGNOSIS — I480 Paroxysmal atrial fibrillation: Secondary | ICD-10-CM | POA: Diagnosis not present

## 2019-08-07 DIAGNOSIS — F0150 Vascular dementia without behavioral disturbance: Secondary | ICD-10-CM | POA: Diagnosis not present

## 2019-08-07 DIAGNOSIS — K5900 Constipation, unspecified: Secondary | ICD-10-CM | POA: Diagnosis not present

## 2019-08-07 DIAGNOSIS — E876 Hypokalemia: Secondary | ICD-10-CM | POA: Diagnosis not present

## 2019-08-07 DIAGNOSIS — G8929 Other chronic pain: Secondary | ICD-10-CM | POA: Diagnosis not present

## 2019-08-07 DIAGNOSIS — I130 Hypertensive heart and chronic kidney disease with heart failure and stage 1 through stage 4 chronic kidney disease, or unspecified chronic kidney disease: Secondary | ICD-10-CM | POA: Diagnosis not present

## 2019-08-07 DIAGNOSIS — I495 Sick sinus syndrome: Secondary | ICD-10-CM | POA: Diagnosis not present

## 2019-08-07 DIAGNOSIS — Z7901 Long term (current) use of anticoagulants: Secondary | ICD-10-CM | POA: Diagnosis not present

## 2019-08-07 DIAGNOSIS — I5032 Chronic diastolic (congestive) heart failure: Secondary | ICD-10-CM | POA: Diagnosis not present

## 2019-08-07 DIAGNOSIS — M8580 Other specified disorders of bone density and structure, unspecified site: Secondary | ICD-10-CM | POA: Diagnosis not present

## 2019-08-07 DIAGNOSIS — I69318 Other symptoms and signs involving cognitive functions following cerebral infarction: Secondary | ICD-10-CM | POA: Diagnosis not present

## 2019-08-07 DIAGNOSIS — I471 Supraventricular tachycardia: Secondary | ICD-10-CM | POA: Diagnosis not present

## 2019-08-07 DIAGNOSIS — D631 Anemia in chronic kidney disease: Secondary | ICD-10-CM | POA: Diagnosis not present

## 2019-08-07 DIAGNOSIS — R531 Weakness: Secondary | ICD-10-CM | POA: Diagnosis not present

## 2019-08-07 DIAGNOSIS — M5442 Lumbago with sciatica, left side: Secondary | ICD-10-CM | POA: Diagnosis not present

## 2019-08-07 DIAGNOSIS — E785 Hyperlipidemia, unspecified: Secondary | ICD-10-CM | POA: Diagnosis not present

## 2019-08-07 DIAGNOSIS — D509 Iron deficiency anemia, unspecified: Secondary | ICD-10-CM | POA: Diagnosis not present

## 2019-08-08 DIAGNOSIS — I651 Occlusion and stenosis of basilar artery: Secondary | ICD-10-CM | POA: Diagnosis not present

## 2019-08-08 DIAGNOSIS — F0150 Vascular dementia without behavioral disturbance: Secondary | ICD-10-CM | POA: Diagnosis not present

## 2019-08-08 DIAGNOSIS — R269 Unspecified abnormalities of gait and mobility: Secondary | ICD-10-CM | POA: Diagnosis not present

## 2019-08-08 DIAGNOSIS — R32 Unspecified urinary incontinence: Secondary | ICD-10-CM | POA: Diagnosis not present

## 2019-08-10 DIAGNOSIS — Z950 Presence of cardiac pacemaker: Secondary | ICD-10-CM | POA: Diagnosis not present

## 2019-08-13 DIAGNOSIS — I651 Occlusion and stenosis of basilar artery: Secondary | ICD-10-CM | POA: Diagnosis not present

## 2019-08-13 DIAGNOSIS — R296 Repeated falls: Secondary | ICD-10-CM | POA: Diagnosis not present

## 2019-08-13 DIAGNOSIS — R32 Unspecified urinary incontinence: Secondary | ICD-10-CM | POA: Diagnosis not present

## 2019-08-13 DIAGNOSIS — R269 Unspecified abnormalities of gait and mobility: Secondary | ICD-10-CM | POA: Diagnosis not present

## 2019-08-14 DIAGNOSIS — R531 Weakness: Secondary | ICD-10-CM | POA: Diagnosis not present

## 2019-08-14 DIAGNOSIS — E876 Hypokalemia: Secondary | ICD-10-CM | POA: Diagnosis not present

## 2019-08-14 DIAGNOSIS — Z9181 History of falling: Secondary | ICD-10-CM | POA: Diagnosis not present

## 2019-08-14 DIAGNOSIS — R112 Nausea with vomiting, unspecified: Secondary | ICD-10-CM | POA: Diagnosis not present

## 2019-08-15 DIAGNOSIS — Z45018 Encounter for adjustment and management of other part of cardiac pacemaker: Secondary | ICD-10-CM | POA: Diagnosis not present

## 2019-08-15 DIAGNOSIS — I495 Sick sinus syndrome: Secondary | ICD-10-CM | POA: Diagnosis not present

## 2019-08-15 DIAGNOSIS — I679 Cerebrovascular disease, unspecified: Secondary | ICD-10-CM | POA: Diagnosis not present

## 2019-08-15 DIAGNOSIS — E782 Mixed hyperlipidemia: Secondary | ICD-10-CM | POA: Diagnosis not present

## 2019-08-15 DIAGNOSIS — H5034 Intermittent alternating exotropia: Secondary | ICD-10-CM | POA: Diagnosis not present

## 2019-08-15 DIAGNOSIS — E032 Hypothyroidism due to medicaments and other exogenous substances: Secondary | ICD-10-CM | POA: Diagnosis not present

## 2019-08-15 DIAGNOSIS — I480 Paroxysmal atrial fibrillation: Secondary | ICD-10-CM | POA: Diagnosis not present

## 2019-08-15 DIAGNOSIS — N183 Chronic kidney disease, stage 3 unspecified: Secondary | ICD-10-CM | POA: Diagnosis not present

## 2019-08-15 DIAGNOSIS — I5032 Chronic diastolic (congestive) heart failure: Secondary | ICD-10-CM | POA: Diagnosis not present

## 2019-08-15 DIAGNOSIS — Z950 Presence of cardiac pacemaker: Secondary | ICD-10-CM | POA: Diagnosis not present

## 2019-08-15 DIAGNOSIS — I129 Hypertensive chronic kidney disease with stage 1 through stage 4 chronic kidney disease, or unspecified chronic kidney disease: Secondary | ICD-10-CM | POA: Diagnosis not present

## 2019-08-23 DIAGNOSIS — E876 Hypokalemia: Secondary | ICD-10-CM | POA: Diagnosis not present

## 2019-08-23 DIAGNOSIS — Z111 Encounter for screening for respiratory tuberculosis: Secondary | ICD-10-CM | POA: Diagnosis not present

## 2019-09-05 DIAGNOSIS — R296 Repeated falls: Secondary | ICD-10-CM | POA: Diagnosis not present

## 2019-09-05 DIAGNOSIS — R41841 Cognitive communication deficit: Secondary | ICD-10-CM | POA: Diagnosis not present

## 2019-09-05 DIAGNOSIS — R488 Other symbolic dysfunctions: Secondary | ICD-10-CM | POA: Diagnosis not present

## 2019-09-05 DIAGNOSIS — R278 Other lack of coordination: Secondary | ICD-10-CM | POA: Diagnosis not present

## 2019-09-05 DIAGNOSIS — R1312 Dysphagia, oropharyngeal phase: Secondary | ICD-10-CM | POA: Diagnosis not present

## 2019-09-05 DIAGNOSIS — R2681 Unsteadiness on feet: Secondary | ICD-10-CM | POA: Diagnosis not present

## 2019-09-05 DIAGNOSIS — M6281 Muscle weakness (generalized): Secondary | ICD-10-CM | POA: Diagnosis not present

## 2019-09-06 DIAGNOSIS — R296 Repeated falls: Secondary | ICD-10-CM | POA: Diagnosis not present

## 2019-09-06 DIAGNOSIS — R488 Other symbolic dysfunctions: Secondary | ICD-10-CM | POA: Diagnosis not present

## 2019-09-06 DIAGNOSIS — R2681 Unsteadiness on feet: Secondary | ICD-10-CM | POA: Diagnosis not present

## 2019-09-06 DIAGNOSIS — M6281 Muscle weakness (generalized): Secondary | ICD-10-CM | POA: Diagnosis not present

## 2019-09-06 DIAGNOSIS — R41841 Cognitive communication deficit: Secondary | ICD-10-CM | POA: Diagnosis not present

## 2019-09-06 DIAGNOSIS — R278 Other lack of coordination: Secondary | ICD-10-CM | POA: Diagnosis not present

## 2019-09-06 DIAGNOSIS — R1312 Dysphagia, oropharyngeal phase: Secondary | ICD-10-CM | POA: Diagnosis not present

## 2019-09-07 DIAGNOSIS — R278 Other lack of coordination: Secondary | ICD-10-CM | POA: Diagnosis not present

## 2019-09-07 DIAGNOSIS — R1312 Dysphagia, oropharyngeal phase: Secondary | ICD-10-CM | POA: Diagnosis not present

## 2019-09-07 DIAGNOSIS — M6281 Muscle weakness (generalized): Secondary | ICD-10-CM | POA: Diagnosis not present

## 2019-09-07 DIAGNOSIS — R296 Repeated falls: Secondary | ICD-10-CM | POA: Diagnosis not present

## 2019-09-07 DIAGNOSIS — R41841 Cognitive communication deficit: Secondary | ICD-10-CM | POA: Diagnosis not present

## 2019-09-07 DIAGNOSIS — R488 Other symbolic dysfunctions: Secondary | ICD-10-CM | POA: Diagnosis not present

## 2019-09-07 DIAGNOSIS — R2681 Unsteadiness on feet: Secondary | ICD-10-CM | POA: Diagnosis not present

## 2019-09-08 DIAGNOSIS — I651 Occlusion and stenosis of basilar artery: Secondary | ICD-10-CM | POA: Diagnosis not present

## 2019-09-08 DIAGNOSIS — R32 Unspecified urinary incontinence: Secondary | ICD-10-CM | POA: Diagnosis not present

## 2019-09-08 DIAGNOSIS — R269 Unspecified abnormalities of gait and mobility: Secondary | ICD-10-CM | POA: Diagnosis not present

## 2019-09-08 DIAGNOSIS — R488 Other symbolic dysfunctions: Secondary | ICD-10-CM | POA: Diagnosis not present

## 2019-09-08 DIAGNOSIS — R278 Other lack of coordination: Secondary | ICD-10-CM | POA: Diagnosis not present

## 2019-09-08 DIAGNOSIS — F0150 Vascular dementia without behavioral disturbance: Secondary | ICD-10-CM | POA: Diagnosis not present

## 2019-09-08 DIAGNOSIS — R2681 Unsteadiness on feet: Secondary | ICD-10-CM | POA: Diagnosis not present

## 2019-09-08 DIAGNOSIS — M6281 Muscle weakness (generalized): Secondary | ICD-10-CM | POA: Diagnosis not present

## 2019-09-08 DIAGNOSIS — G609 Hereditary and idiopathic neuropathy, unspecified: Secondary | ICD-10-CM | POA: Diagnosis not present

## 2019-09-08 DIAGNOSIS — M79671 Pain in right foot: Secondary | ICD-10-CM | POA: Diagnosis not present

## 2019-09-08 DIAGNOSIS — R1312 Dysphagia, oropharyngeal phase: Secondary | ICD-10-CM | POA: Diagnosis not present

## 2019-09-08 DIAGNOSIS — R296 Repeated falls: Secondary | ICD-10-CM | POA: Diagnosis not present

## 2019-09-08 DIAGNOSIS — R41841 Cognitive communication deficit: Secondary | ICD-10-CM | POA: Diagnosis not present

## 2019-09-11 DIAGNOSIS — R296 Repeated falls: Secondary | ICD-10-CM | POA: Diagnosis not present

## 2019-09-11 DIAGNOSIS — R488 Other symbolic dysfunctions: Secondary | ICD-10-CM | POA: Diagnosis not present

## 2019-09-11 DIAGNOSIS — R41841 Cognitive communication deficit: Secondary | ICD-10-CM | POA: Diagnosis not present

## 2019-09-11 DIAGNOSIS — M6281 Muscle weakness (generalized): Secondary | ICD-10-CM | POA: Diagnosis not present

## 2019-09-11 DIAGNOSIS — R2681 Unsteadiness on feet: Secondary | ICD-10-CM | POA: Diagnosis not present

## 2019-09-11 DIAGNOSIS — R1312 Dysphagia, oropharyngeal phase: Secondary | ICD-10-CM | POA: Diagnosis not present

## 2019-09-11 DIAGNOSIS — R278 Other lack of coordination: Secondary | ICD-10-CM | POA: Diagnosis not present

## 2019-09-12 DIAGNOSIS — R1312 Dysphagia, oropharyngeal phase: Secondary | ICD-10-CM | POA: Diagnosis not present

## 2019-09-12 DIAGNOSIS — R296 Repeated falls: Secondary | ICD-10-CM | POA: Diagnosis not present

## 2019-09-12 DIAGNOSIS — R278 Other lack of coordination: Secondary | ICD-10-CM | POA: Diagnosis not present

## 2019-09-12 DIAGNOSIS — R41841 Cognitive communication deficit: Secondary | ICD-10-CM | POA: Diagnosis not present

## 2019-09-12 DIAGNOSIS — Z79899 Other long term (current) drug therapy: Secondary | ICD-10-CM | POA: Diagnosis not present

## 2019-09-12 DIAGNOSIS — R2681 Unsteadiness on feet: Secondary | ICD-10-CM | POA: Diagnosis not present

## 2019-09-12 DIAGNOSIS — M6281 Muscle weakness (generalized): Secondary | ICD-10-CM | POA: Diagnosis not present

## 2019-09-12 DIAGNOSIS — R488 Other symbolic dysfunctions: Secondary | ICD-10-CM | POA: Diagnosis not present

## 2019-09-12 DIAGNOSIS — N390 Urinary tract infection, site not specified: Secondary | ICD-10-CM | POA: Diagnosis not present

## 2019-09-13 DIAGNOSIS — R32 Unspecified urinary incontinence: Secondary | ICD-10-CM | POA: Diagnosis not present

## 2019-09-13 DIAGNOSIS — R2681 Unsteadiness on feet: Secondary | ICD-10-CM | POA: Diagnosis not present

## 2019-09-13 DIAGNOSIS — R278 Other lack of coordination: Secondary | ICD-10-CM | POA: Diagnosis not present

## 2019-09-13 DIAGNOSIS — R269 Unspecified abnormalities of gait and mobility: Secondary | ICD-10-CM | POA: Diagnosis not present

## 2019-09-13 DIAGNOSIS — R1312 Dysphagia, oropharyngeal phase: Secondary | ICD-10-CM | POA: Diagnosis not present

## 2019-09-13 DIAGNOSIS — R296 Repeated falls: Secondary | ICD-10-CM | POA: Diagnosis not present

## 2019-09-13 DIAGNOSIS — R41841 Cognitive communication deficit: Secondary | ICD-10-CM | POA: Diagnosis not present

## 2019-09-13 DIAGNOSIS — I651 Occlusion and stenosis of basilar artery: Secondary | ICD-10-CM | POA: Diagnosis not present

## 2019-09-13 DIAGNOSIS — R488 Other symbolic dysfunctions: Secondary | ICD-10-CM | POA: Diagnosis not present

## 2019-09-13 DIAGNOSIS — M6281 Muscle weakness (generalized): Secondary | ICD-10-CM | POA: Diagnosis not present

## 2019-09-14 DIAGNOSIS — N390 Urinary tract infection, site not specified: Secondary | ICD-10-CM | POA: Diagnosis not present

## 2019-09-14 DIAGNOSIS — R2681 Unsteadiness on feet: Secondary | ICD-10-CM | POA: Diagnosis not present

## 2019-09-14 DIAGNOSIS — R1312 Dysphagia, oropharyngeal phase: Secondary | ICD-10-CM | POA: Diagnosis not present

## 2019-09-14 DIAGNOSIS — R296 Repeated falls: Secondary | ICD-10-CM | POA: Diagnosis not present

## 2019-09-14 DIAGNOSIS — R278 Other lack of coordination: Secondary | ICD-10-CM | POA: Diagnosis not present

## 2019-09-14 DIAGNOSIS — R41841 Cognitive communication deficit: Secondary | ICD-10-CM | POA: Diagnosis not present

## 2019-09-14 DIAGNOSIS — R488 Other symbolic dysfunctions: Secondary | ICD-10-CM | POA: Diagnosis not present

## 2019-09-14 DIAGNOSIS — M6281 Muscle weakness (generalized): Secondary | ICD-10-CM | POA: Diagnosis not present

## 2019-09-14 DIAGNOSIS — R319 Hematuria, unspecified: Secondary | ICD-10-CM | POA: Diagnosis not present

## 2019-09-15 DIAGNOSIS — R296 Repeated falls: Secondary | ICD-10-CM | POA: Diagnosis not present

## 2019-09-15 DIAGNOSIS — M6281 Muscle weakness (generalized): Secondary | ICD-10-CM | POA: Diagnosis not present

## 2019-09-15 DIAGNOSIS — R2681 Unsteadiness on feet: Secondary | ICD-10-CM | POA: Diagnosis not present

## 2019-09-15 DIAGNOSIS — R278 Other lack of coordination: Secondary | ICD-10-CM | POA: Diagnosis not present

## 2019-09-15 DIAGNOSIS — R1312 Dysphagia, oropharyngeal phase: Secondary | ICD-10-CM | POA: Diagnosis not present

## 2019-09-15 DIAGNOSIS — R41841 Cognitive communication deficit: Secondary | ICD-10-CM | POA: Diagnosis not present

## 2019-09-15 DIAGNOSIS — R488 Other symbolic dysfunctions: Secondary | ICD-10-CM | POA: Diagnosis not present

## 2019-09-18 DIAGNOSIS — R488 Other symbolic dysfunctions: Secondary | ICD-10-CM | POA: Diagnosis not present

## 2019-09-18 DIAGNOSIS — R278 Other lack of coordination: Secondary | ICD-10-CM | POA: Diagnosis not present

## 2019-09-18 DIAGNOSIS — R41841 Cognitive communication deficit: Secondary | ICD-10-CM | POA: Diagnosis not present

## 2019-09-18 DIAGNOSIS — R1312 Dysphagia, oropharyngeal phase: Secondary | ICD-10-CM | POA: Diagnosis not present

## 2019-09-18 DIAGNOSIS — R296 Repeated falls: Secondary | ICD-10-CM | POA: Diagnosis not present

## 2019-09-18 DIAGNOSIS — M6281 Muscle weakness (generalized): Secondary | ICD-10-CM | POA: Diagnosis not present

## 2019-09-18 DIAGNOSIS — R2681 Unsteadiness on feet: Secondary | ICD-10-CM | POA: Diagnosis not present

## 2019-09-19 DIAGNOSIS — R488 Other symbolic dysfunctions: Secondary | ICD-10-CM | POA: Diagnosis not present

## 2019-09-19 DIAGNOSIS — M6281 Muscle weakness (generalized): Secondary | ICD-10-CM | POA: Diagnosis not present

## 2019-09-19 DIAGNOSIS — R296 Repeated falls: Secondary | ICD-10-CM | POA: Diagnosis not present

## 2019-09-19 DIAGNOSIS — R2681 Unsteadiness on feet: Secondary | ICD-10-CM | POA: Diagnosis not present

## 2019-09-19 DIAGNOSIS — R278 Other lack of coordination: Secondary | ICD-10-CM | POA: Diagnosis not present

## 2019-09-19 DIAGNOSIS — R41841 Cognitive communication deficit: Secondary | ICD-10-CM | POA: Diagnosis not present

## 2019-09-19 DIAGNOSIS — R1312 Dysphagia, oropharyngeal phase: Secondary | ICD-10-CM | POA: Diagnosis not present

## 2019-09-20 DIAGNOSIS — R296 Repeated falls: Secondary | ICD-10-CM | POA: Diagnosis not present

## 2019-09-20 DIAGNOSIS — M6281 Muscle weakness (generalized): Secondary | ICD-10-CM | POA: Diagnosis not present

## 2019-09-20 DIAGNOSIS — R41841 Cognitive communication deficit: Secondary | ICD-10-CM | POA: Diagnosis not present

## 2019-09-20 DIAGNOSIS — R488 Other symbolic dysfunctions: Secondary | ICD-10-CM | POA: Diagnosis not present

## 2019-09-20 DIAGNOSIS — R278 Other lack of coordination: Secondary | ICD-10-CM | POA: Diagnosis not present

## 2019-09-20 DIAGNOSIS — R1312 Dysphagia, oropharyngeal phase: Secondary | ICD-10-CM | POA: Diagnosis not present

## 2019-09-20 DIAGNOSIS — R2681 Unsteadiness on feet: Secondary | ICD-10-CM | POA: Diagnosis not present

## 2019-09-21 DIAGNOSIS — R296 Repeated falls: Secondary | ICD-10-CM | POA: Diagnosis not present

## 2019-09-21 DIAGNOSIS — R2681 Unsteadiness on feet: Secondary | ICD-10-CM | POA: Diagnosis not present

## 2019-09-21 DIAGNOSIS — R1312 Dysphagia, oropharyngeal phase: Secondary | ICD-10-CM | POA: Diagnosis not present

## 2019-09-21 DIAGNOSIS — R278 Other lack of coordination: Secondary | ICD-10-CM | POA: Diagnosis not present

## 2019-09-21 DIAGNOSIS — M6281 Muscle weakness (generalized): Secondary | ICD-10-CM | POA: Diagnosis not present

## 2019-09-21 DIAGNOSIS — R41841 Cognitive communication deficit: Secondary | ICD-10-CM | POA: Diagnosis not present

## 2019-09-21 DIAGNOSIS — R488 Other symbolic dysfunctions: Secondary | ICD-10-CM | POA: Diagnosis not present

## 2019-09-22 DIAGNOSIS — R278 Other lack of coordination: Secondary | ICD-10-CM | POA: Diagnosis not present

## 2019-09-22 DIAGNOSIS — R296 Repeated falls: Secondary | ICD-10-CM | POA: Diagnosis not present

## 2019-09-22 DIAGNOSIS — R41841 Cognitive communication deficit: Secondary | ICD-10-CM | POA: Diagnosis not present

## 2019-09-22 DIAGNOSIS — R2681 Unsteadiness on feet: Secondary | ICD-10-CM | POA: Diagnosis not present

## 2019-09-22 DIAGNOSIS — M6281 Muscle weakness (generalized): Secondary | ICD-10-CM | POA: Diagnosis not present

## 2019-09-22 DIAGNOSIS — R488 Other symbolic dysfunctions: Secondary | ICD-10-CM | POA: Diagnosis not present

## 2019-09-22 DIAGNOSIS — R1312 Dysphagia, oropharyngeal phase: Secondary | ICD-10-CM | POA: Diagnosis not present

## 2019-09-25 DIAGNOSIS — R278 Other lack of coordination: Secondary | ICD-10-CM | POA: Diagnosis not present

## 2019-09-25 DIAGNOSIS — R488 Other symbolic dysfunctions: Secondary | ICD-10-CM | POA: Diagnosis not present

## 2019-09-25 DIAGNOSIS — R296 Repeated falls: Secondary | ICD-10-CM | POA: Diagnosis not present

## 2019-09-25 DIAGNOSIS — R2681 Unsteadiness on feet: Secondary | ICD-10-CM | POA: Diagnosis not present

## 2019-09-25 DIAGNOSIS — R1312 Dysphagia, oropharyngeal phase: Secondary | ICD-10-CM | POA: Diagnosis not present

## 2019-09-25 DIAGNOSIS — M6281 Muscle weakness (generalized): Secondary | ICD-10-CM | POA: Diagnosis not present

## 2019-09-25 DIAGNOSIS — R41841 Cognitive communication deficit: Secondary | ICD-10-CM | POA: Diagnosis not present

## 2019-09-26 DIAGNOSIS — R1312 Dysphagia, oropharyngeal phase: Secondary | ICD-10-CM | POA: Diagnosis not present

## 2019-09-26 DIAGNOSIS — R278 Other lack of coordination: Secondary | ICD-10-CM | POA: Diagnosis not present

## 2019-09-26 DIAGNOSIS — M6281 Muscle weakness (generalized): Secondary | ICD-10-CM | POA: Diagnosis not present

## 2019-09-26 DIAGNOSIS — R488 Other symbolic dysfunctions: Secondary | ICD-10-CM | POA: Diagnosis not present

## 2019-09-26 DIAGNOSIS — R41841 Cognitive communication deficit: Secondary | ICD-10-CM | POA: Diagnosis not present

## 2019-09-26 DIAGNOSIS — R2681 Unsteadiness on feet: Secondary | ICD-10-CM | POA: Diagnosis not present

## 2019-09-26 DIAGNOSIS — R296 Repeated falls: Secondary | ICD-10-CM | POA: Diagnosis not present

## 2019-09-27 DIAGNOSIS — R2681 Unsteadiness on feet: Secondary | ICD-10-CM | POA: Diagnosis not present

## 2019-09-27 DIAGNOSIS — R488 Other symbolic dysfunctions: Secondary | ICD-10-CM | POA: Diagnosis not present

## 2019-09-27 DIAGNOSIS — R296 Repeated falls: Secondary | ICD-10-CM | POA: Diagnosis not present

## 2019-09-27 DIAGNOSIS — M6281 Muscle weakness (generalized): Secondary | ICD-10-CM | POA: Diagnosis not present

## 2019-09-27 DIAGNOSIS — R1312 Dysphagia, oropharyngeal phase: Secondary | ICD-10-CM | POA: Diagnosis not present

## 2019-09-27 DIAGNOSIS — R278 Other lack of coordination: Secondary | ICD-10-CM | POA: Diagnosis not present

## 2019-09-27 DIAGNOSIS — R41841 Cognitive communication deficit: Secondary | ICD-10-CM | POA: Diagnosis not present

## 2019-09-28 DIAGNOSIS — R488 Other symbolic dysfunctions: Secondary | ICD-10-CM | POA: Diagnosis not present

## 2019-09-28 DIAGNOSIS — R41841 Cognitive communication deficit: Secondary | ICD-10-CM | POA: Diagnosis not present

## 2019-09-28 DIAGNOSIS — R296 Repeated falls: Secondary | ICD-10-CM | POA: Diagnosis not present

## 2019-09-28 DIAGNOSIS — R278 Other lack of coordination: Secondary | ICD-10-CM | POA: Diagnosis not present

## 2019-09-28 DIAGNOSIS — M6281 Muscle weakness (generalized): Secondary | ICD-10-CM | POA: Diagnosis not present

## 2019-09-28 DIAGNOSIS — R1312 Dysphagia, oropharyngeal phase: Secondary | ICD-10-CM | POA: Diagnosis not present

## 2019-09-28 DIAGNOSIS — R2681 Unsteadiness on feet: Secondary | ICD-10-CM | POA: Diagnosis not present

## 2019-09-29 DIAGNOSIS — R2681 Unsteadiness on feet: Secondary | ICD-10-CM | POA: Diagnosis not present

## 2019-09-29 DIAGNOSIS — R296 Repeated falls: Secondary | ICD-10-CM | POA: Diagnosis not present

## 2019-09-29 DIAGNOSIS — R1312 Dysphagia, oropharyngeal phase: Secondary | ICD-10-CM | POA: Diagnosis not present

## 2019-09-29 DIAGNOSIS — M6281 Muscle weakness (generalized): Secondary | ICD-10-CM | POA: Diagnosis not present

## 2019-09-29 DIAGNOSIS — R278 Other lack of coordination: Secondary | ICD-10-CM | POA: Diagnosis not present

## 2019-09-29 DIAGNOSIS — R41841 Cognitive communication deficit: Secondary | ICD-10-CM | POA: Diagnosis not present

## 2019-09-29 DIAGNOSIS — R488 Other symbolic dysfunctions: Secondary | ICD-10-CM | POA: Diagnosis not present

## 2019-09-30 DIAGNOSIS — R488 Other symbolic dysfunctions: Secondary | ICD-10-CM | POA: Diagnosis not present

## 2019-09-30 DIAGNOSIS — R41841 Cognitive communication deficit: Secondary | ICD-10-CM | POA: Diagnosis not present

## 2019-09-30 DIAGNOSIS — R1312 Dysphagia, oropharyngeal phase: Secondary | ICD-10-CM | POA: Diagnosis not present

## 2019-09-30 DIAGNOSIS — R278 Other lack of coordination: Secondary | ICD-10-CM | POA: Diagnosis not present

## 2019-09-30 DIAGNOSIS — R296 Repeated falls: Secondary | ICD-10-CM | POA: Diagnosis not present

## 2019-09-30 DIAGNOSIS — M6281 Muscle weakness (generalized): Secondary | ICD-10-CM | POA: Diagnosis not present

## 2019-09-30 DIAGNOSIS — R2681 Unsteadiness on feet: Secondary | ICD-10-CM | POA: Diagnosis not present

## 2019-10-02 DIAGNOSIS — R278 Other lack of coordination: Secondary | ICD-10-CM | POA: Diagnosis not present

## 2019-10-02 DIAGNOSIS — M6281 Muscle weakness (generalized): Secondary | ICD-10-CM | POA: Diagnosis not present

## 2019-10-02 DIAGNOSIS — R2681 Unsteadiness on feet: Secondary | ICD-10-CM | POA: Diagnosis not present

## 2019-10-02 DIAGNOSIS — R488 Other symbolic dysfunctions: Secondary | ICD-10-CM | POA: Diagnosis not present

## 2019-10-02 DIAGNOSIS — R41841 Cognitive communication deficit: Secondary | ICD-10-CM | POA: Diagnosis not present

## 2019-10-02 DIAGNOSIS — R296 Repeated falls: Secondary | ICD-10-CM | POA: Diagnosis not present

## 2019-10-02 DIAGNOSIS — R1312 Dysphagia, oropharyngeal phase: Secondary | ICD-10-CM | POA: Diagnosis not present

## 2019-10-03 DIAGNOSIS — R41841 Cognitive communication deficit: Secondary | ICD-10-CM | POA: Diagnosis not present

## 2019-10-03 DIAGNOSIS — R278 Other lack of coordination: Secondary | ICD-10-CM | POA: Diagnosis not present

## 2019-10-03 DIAGNOSIS — R2681 Unsteadiness on feet: Secondary | ICD-10-CM | POA: Diagnosis not present

## 2019-10-03 DIAGNOSIS — M6281 Muscle weakness (generalized): Secondary | ICD-10-CM | POA: Diagnosis not present

## 2019-10-03 DIAGNOSIS — R488 Other symbolic dysfunctions: Secondary | ICD-10-CM | POA: Diagnosis not present

## 2019-10-03 DIAGNOSIS — R296 Repeated falls: Secondary | ICD-10-CM | POA: Diagnosis not present

## 2019-10-03 DIAGNOSIS — R1312 Dysphagia, oropharyngeal phase: Secondary | ICD-10-CM | POA: Diagnosis not present

## 2019-10-04 DIAGNOSIS — R41841 Cognitive communication deficit: Secondary | ICD-10-CM | POA: Diagnosis not present

## 2019-10-04 DIAGNOSIS — L989 Disorder of the skin and subcutaneous tissue, unspecified: Secondary | ICD-10-CM | POA: Diagnosis not present

## 2019-10-04 DIAGNOSIS — E782 Mixed hyperlipidemia: Secondary | ICD-10-CM | POA: Diagnosis not present

## 2019-10-04 DIAGNOSIS — R110 Nausea: Secondary | ICD-10-CM | POA: Diagnosis not present

## 2019-10-04 DIAGNOSIS — R609 Edema, unspecified: Secondary | ICD-10-CM | POA: Diagnosis not present

## 2019-10-04 DIAGNOSIS — F0390 Unspecified dementia without behavioral disturbance: Secondary | ICD-10-CM | POA: Diagnosis not present

## 2019-10-04 DIAGNOSIS — R1312 Dysphagia, oropharyngeal phase: Secondary | ICD-10-CM | POA: Diagnosis not present

## 2019-10-04 DIAGNOSIS — R278 Other lack of coordination: Secondary | ICD-10-CM | POA: Diagnosis not present

## 2019-10-04 DIAGNOSIS — R2681 Unsteadiness on feet: Secondary | ICD-10-CM | POA: Diagnosis not present

## 2019-10-04 DIAGNOSIS — E039 Hypothyroidism, unspecified: Secondary | ICD-10-CM | POA: Diagnosis not present

## 2019-10-04 DIAGNOSIS — M6281 Muscle weakness (generalized): Secondary | ICD-10-CM | POA: Diagnosis not present

## 2019-10-04 DIAGNOSIS — Z79899 Other long term (current) drug therapy: Secondary | ICD-10-CM | POA: Diagnosis not present

## 2019-10-04 DIAGNOSIS — R488 Other symbolic dysfunctions: Secondary | ICD-10-CM | POA: Diagnosis not present

## 2019-10-04 DIAGNOSIS — R296 Repeated falls: Secondary | ICD-10-CM | POA: Diagnosis not present

## 2019-10-04 DIAGNOSIS — R600 Localized edema: Secondary | ICD-10-CM | POA: Diagnosis not present

## 2019-10-04 DIAGNOSIS — W19XXXA Unspecified fall, initial encounter: Secondary | ICD-10-CM | POA: Diagnosis not present

## 2019-10-05 DIAGNOSIS — R2241 Localized swelling, mass and lump, right lower limb: Secondary | ICD-10-CM | POA: Diagnosis not present

## 2019-10-05 DIAGNOSIS — M6281 Muscle weakness (generalized): Secondary | ICD-10-CM | POA: Diagnosis not present

## 2019-10-05 DIAGNOSIS — R488 Other symbolic dysfunctions: Secondary | ICD-10-CM | POA: Diagnosis not present

## 2019-10-05 DIAGNOSIS — R1312 Dysphagia, oropharyngeal phase: Secondary | ICD-10-CM | POA: Diagnosis not present

## 2019-10-05 DIAGNOSIS — R296 Repeated falls: Secondary | ICD-10-CM | POA: Diagnosis not present

## 2019-10-05 DIAGNOSIS — R41841 Cognitive communication deficit: Secondary | ICD-10-CM | POA: Diagnosis not present

## 2019-10-05 DIAGNOSIS — R278 Other lack of coordination: Secondary | ICD-10-CM | POA: Diagnosis not present

## 2019-10-05 DIAGNOSIS — R2681 Unsteadiness on feet: Secondary | ICD-10-CM | POA: Diagnosis not present

## 2019-10-06 DIAGNOSIS — R278 Other lack of coordination: Secondary | ICD-10-CM | POA: Diagnosis not present

## 2019-10-06 DIAGNOSIS — I739 Peripheral vascular disease, unspecified: Secondary | ICD-10-CM | POA: Diagnosis not present

## 2019-10-06 DIAGNOSIS — R296 Repeated falls: Secondary | ICD-10-CM | POA: Diagnosis not present

## 2019-10-06 DIAGNOSIS — M6281 Muscle weakness (generalized): Secondary | ICD-10-CM | POA: Diagnosis not present

## 2019-10-06 DIAGNOSIS — R488 Other symbolic dysfunctions: Secondary | ICD-10-CM | POA: Diagnosis not present

## 2019-10-06 DIAGNOSIS — R41841 Cognitive communication deficit: Secondary | ICD-10-CM | POA: Diagnosis not present

## 2019-10-06 DIAGNOSIS — R2681 Unsteadiness on feet: Secondary | ICD-10-CM | POA: Diagnosis not present

## 2019-10-06 DIAGNOSIS — R1312 Dysphagia, oropharyngeal phase: Secondary | ICD-10-CM | POA: Diagnosis not present

## 2019-10-08 DIAGNOSIS — R269 Unspecified abnormalities of gait and mobility: Secondary | ICD-10-CM | POA: Diagnosis not present

## 2019-10-08 DIAGNOSIS — I651 Occlusion and stenosis of basilar artery: Secondary | ICD-10-CM | POA: Diagnosis not present

## 2019-10-08 DIAGNOSIS — F0150 Vascular dementia without behavioral disturbance: Secondary | ICD-10-CM | POA: Diagnosis not present

## 2019-10-08 DIAGNOSIS — R32 Unspecified urinary incontinence: Secondary | ICD-10-CM | POA: Diagnosis not present

## 2019-10-10 DIAGNOSIS — R41841 Cognitive communication deficit: Secondary | ICD-10-CM | POA: Diagnosis not present

## 2019-10-10 DIAGNOSIS — R2681 Unsteadiness on feet: Secondary | ICD-10-CM | POA: Diagnosis not present

## 2019-10-10 DIAGNOSIS — R278 Other lack of coordination: Secondary | ICD-10-CM | POA: Diagnosis not present

## 2019-10-10 DIAGNOSIS — M6281 Muscle weakness (generalized): Secondary | ICD-10-CM | POA: Diagnosis not present

## 2019-10-10 DIAGNOSIS — R1312 Dysphagia, oropharyngeal phase: Secondary | ICD-10-CM | POA: Diagnosis not present

## 2019-10-10 DIAGNOSIS — R296 Repeated falls: Secondary | ICD-10-CM | POA: Diagnosis not present

## 2019-10-10 DIAGNOSIS — R488 Other symbolic dysfunctions: Secondary | ICD-10-CM | POA: Diagnosis not present

## 2019-10-11 DIAGNOSIS — R278 Other lack of coordination: Secondary | ICD-10-CM | POA: Diagnosis not present

## 2019-10-11 DIAGNOSIS — R488 Other symbolic dysfunctions: Secondary | ICD-10-CM | POA: Diagnosis not present

## 2019-10-11 DIAGNOSIS — M6281 Muscle weakness (generalized): Secondary | ICD-10-CM | POA: Diagnosis not present

## 2019-10-11 DIAGNOSIS — R1312 Dysphagia, oropharyngeal phase: Secondary | ICD-10-CM | POA: Diagnosis not present

## 2019-10-11 DIAGNOSIS — R296 Repeated falls: Secondary | ICD-10-CM | POA: Diagnosis not present

## 2019-10-11 DIAGNOSIS — R2681 Unsteadiness on feet: Secondary | ICD-10-CM | POA: Diagnosis not present

## 2019-10-11 DIAGNOSIS — R41841 Cognitive communication deficit: Secondary | ICD-10-CM | POA: Diagnosis not present

## 2019-10-12 DIAGNOSIS — M6281 Muscle weakness (generalized): Secondary | ICD-10-CM | POA: Diagnosis not present

## 2019-10-12 DIAGNOSIS — R278 Other lack of coordination: Secondary | ICD-10-CM | POA: Diagnosis not present

## 2019-10-12 DIAGNOSIS — R488 Other symbolic dysfunctions: Secondary | ICD-10-CM | POA: Diagnosis not present

## 2019-10-12 DIAGNOSIS — R1312 Dysphagia, oropharyngeal phase: Secondary | ICD-10-CM | POA: Diagnosis not present

## 2019-10-12 DIAGNOSIS — R41841 Cognitive communication deficit: Secondary | ICD-10-CM | POA: Diagnosis not present

## 2019-10-12 DIAGNOSIS — R2681 Unsteadiness on feet: Secondary | ICD-10-CM | POA: Diagnosis not present

## 2019-10-12 DIAGNOSIS — R296 Repeated falls: Secondary | ICD-10-CM | POA: Diagnosis not present

## 2019-10-13 DIAGNOSIS — R2681 Unsteadiness on feet: Secondary | ICD-10-CM | POA: Diagnosis not present

## 2019-10-13 DIAGNOSIS — R269 Unspecified abnormalities of gait and mobility: Secondary | ICD-10-CM | POA: Diagnosis not present

## 2019-10-13 DIAGNOSIS — R296 Repeated falls: Secondary | ICD-10-CM | POA: Diagnosis not present

## 2019-10-13 DIAGNOSIS — M6281 Muscle weakness (generalized): Secondary | ICD-10-CM | POA: Diagnosis not present

## 2019-10-13 DIAGNOSIS — R1312 Dysphagia, oropharyngeal phase: Secondary | ICD-10-CM | POA: Diagnosis not present

## 2019-10-13 DIAGNOSIS — R41841 Cognitive communication deficit: Secondary | ICD-10-CM | POA: Diagnosis not present

## 2019-10-13 DIAGNOSIS — R32 Unspecified urinary incontinence: Secondary | ICD-10-CM | POA: Diagnosis not present

## 2019-10-13 DIAGNOSIS — R278 Other lack of coordination: Secondary | ICD-10-CM | POA: Diagnosis not present

## 2019-10-13 DIAGNOSIS — I651 Occlusion and stenosis of basilar artery: Secondary | ICD-10-CM | POA: Diagnosis not present

## 2019-10-13 DIAGNOSIS — R488 Other symbolic dysfunctions: Secondary | ICD-10-CM | POA: Diagnosis not present

## 2019-10-14 DIAGNOSIS — R296 Repeated falls: Secondary | ICD-10-CM | POA: Diagnosis not present

## 2019-10-14 DIAGNOSIS — R2681 Unsteadiness on feet: Secondary | ICD-10-CM | POA: Diagnosis not present

## 2019-10-14 DIAGNOSIS — M6281 Muscle weakness (generalized): Secondary | ICD-10-CM | POA: Diagnosis not present

## 2019-10-14 DIAGNOSIS — R1312 Dysphagia, oropharyngeal phase: Secondary | ICD-10-CM | POA: Diagnosis not present

## 2019-10-14 DIAGNOSIS — R278 Other lack of coordination: Secondary | ICD-10-CM | POA: Diagnosis not present

## 2019-10-14 DIAGNOSIS — R41841 Cognitive communication deficit: Secondary | ICD-10-CM | POA: Diagnosis not present

## 2019-10-14 DIAGNOSIS — R488 Other symbolic dysfunctions: Secondary | ICD-10-CM | POA: Diagnosis not present

## 2019-10-16 DIAGNOSIS — R488 Other symbolic dysfunctions: Secondary | ICD-10-CM | POA: Diagnosis not present

## 2019-10-16 DIAGNOSIS — M6281 Muscle weakness (generalized): Secondary | ICD-10-CM | POA: Diagnosis not present

## 2019-10-16 DIAGNOSIS — R278 Other lack of coordination: Secondary | ICD-10-CM | POA: Diagnosis not present

## 2019-10-16 DIAGNOSIS — R1312 Dysphagia, oropharyngeal phase: Secondary | ICD-10-CM | POA: Diagnosis not present

## 2019-10-16 DIAGNOSIS — R296 Repeated falls: Secondary | ICD-10-CM | POA: Diagnosis not present

## 2019-10-16 DIAGNOSIS — R2681 Unsteadiness on feet: Secondary | ICD-10-CM | POA: Diagnosis not present

## 2019-10-16 DIAGNOSIS — R41841 Cognitive communication deficit: Secondary | ICD-10-CM | POA: Diagnosis not present

## 2019-10-17 DIAGNOSIS — R1312 Dysphagia, oropharyngeal phase: Secondary | ICD-10-CM | POA: Diagnosis not present

## 2019-10-17 DIAGNOSIS — R296 Repeated falls: Secondary | ICD-10-CM | POA: Diagnosis not present

## 2019-10-17 DIAGNOSIS — M6281 Muscle weakness (generalized): Secondary | ICD-10-CM | POA: Diagnosis not present

## 2019-10-17 DIAGNOSIS — R278 Other lack of coordination: Secondary | ICD-10-CM | POA: Diagnosis not present

## 2019-10-17 DIAGNOSIS — R488 Other symbolic dysfunctions: Secondary | ICD-10-CM | POA: Diagnosis not present

## 2019-10-17 DIAGNOSIS — R2681 Unsteadiness on feet: Secondary | ICD-10-CM | POA: Diagnosis not present

## 2019-10-17 DIAGNOSIS — R41841 Cognitive communication deficit: Secondary | ICD-10-CM | POA: Diagnosis not present

## 2019-10-18 DIAGNOSIS — R41841 Cognitive communication deficit: Secondary | ICD-10-CM | POA: Diagnosis not present

## 2019-10-18 DIAGNOSIS — M6281 Muscle weakness (generalized): Secondary | ICD-10-CM | POA: Diagnosis not present

## 2019-10-18 DIAGNOSIS — R600 Localized edema: Secondary | ICD-10-CM | POA: Diagnosis not present

## 2019-10-18 DIAGNOSIS — M25551 Pain in right hip: Secondary | ICD-10-CM | POA: Diagnosis not present

## 2019-10-18 DIAGNOSIS — K5909 Other constipation: Secondary | ICD-10-CM | POA: Diagnosis not present

## 2019-10-18 DIAGNOSIS — R2681 Unsteadiness on feet: Secondary | ICD-10-CM | POA: Diagnosis not present

## 2019-10-18 DIAGNOSIS — R1312 Dysphagia, oropharyngeal phase: Secondary | ICD-10-CM | POA: Diagnosis not present

## 2019-10-18 DIAGNOSIS — R278 Other lack of coordination: Secondary | ICD-10-CM | POA: Diagnosis not present

## 2019-10-18 DIAGNOSIS — R488 Other symbolic dysfunctions: Secondary | ICD-10-CM | POA: Diagnosis not present

## 2019-10-18 DIAGNOSIS — R296 Repeated falls: Secondary | ICD-10-CM | POA: Diagnosis not present

## 2019-10-18 DIAGNOSIS — K219 Gastro-esophageal reflux disease without esophagitis: Secondary | ICD-10-CM | POA: Diagnosis not present

## 2019-10-19 ENCOUNTER — Emergency Department (HOSPITAL_COMMUNITY)
Admission: EM | Admit: 2019-10-19 | Discharge: 2019-10-19 | Disposition: A | Discharge status: Home / Self Care | Payer: PPO | Attending: Emergency Medicine | Admitting: Emergency Medicine

## 2019-10-19 DIAGNOSIS — Z7901 Long term (current) use of anticoagulants: Secondary | ICD-10-CM | POA: Insufficient documentation

## 2019-10-19 DIAGNOSIS — K029 Dental caries, unspecified: Secondary | ICD-10-CM | POA: Insufficient documentation

## 2019-10-19 DIAGNOSIS — R279 Unspecified lack of coordination: Secondary | ICD-10-CM | POA: Diagnosis not present

## 2019-10-19 DIAGNOSIS — R296 Repeated falls: Secondary | ICD-10-CM | POA: Diagnosis not present

## 2019-10-19 DIAGNOSIS — K068 Other specified disorders of gingiva and edentulous alveolar ridge: Secondary | ICD-10-CM | POA: Diagnosis not present

## 2019-10-19 DIAGNOSIS — Z86010 Personal history of colonic polyps: Secondary | ICD-10-CM | POA: Insufficient documentation

## 2019-10-19 DIAGNOSIS — Z743 Need for continuous supervision: Secondary | ICD-10-CM | POA: Diagnosis not present

## 2019-10-19 DIAGNOSIS — R2681 Unsteadiness on feet: Secondary | ICD-10-CM | POA: Diagnosis not present

## 2019-10-19 DIAGNOSIS — E039 Hypothyroidism, unspecified: Secondary | ICD-10-CM | POA: Insufficient documentation

## 2019-10-19 DIAGNOSIS — R1312 Dysphagia, oropharyngeal phase: Secondary | ICD-10-CM | POA: Diagnosis not present

## 2019-10-19 DIAGNOSIS — R5381 Other malaise: Secondary | ICD-10-CM | POA: Diagnosis not present

## 2019-10-19 DIAGNOSIS — R41841 Cognitive communication deficit: Secondary | ICD-10-CM | POA: Diagnosis not present

## 2019-10-19 DIAGNOSIS — R278 Other lack of coordination: Secondary | ICD-10-CM | POA: Diagnosis not present

## 2019-10-19 DIAGNOSIS — Z79899 Other long term (current) drug therapy: Secondary | ICD-10-CM | POA: Insufficient documentation

## 2019-10-19 DIAGNOSIS — M6281 Muscle weakness (generalized): Secondary | ICD-10-CM | POA: Diagnosis not present

## 2019-10-19 DIAGNOSIS — R488 Other symbolic dysfunctions: Secondary | ICD-10-CM | POA: Diagnosis not present

## 2019-10-19 DIAGNOSIS — R58 Hemorrhage, not elsewhere classified: Secondary | ICD-10-CM | POA: Diagnosis not present

## 2019-10-19 DIAGNOSIS — I10 Essential (primary) hypertension: Secondary | ICD-10-CM | POA: Insufficient documentation

## 2019-10-19 DIAGNOSIS — K1379 Other lesions of oral mucosa: Secondary | ICD-10-CM

## 2019-10-19 MED ORDER — "TRANEXAMIC ACID 5% ORAL SOLUTION "
10.0000 mL | Freq: Once | ORAL | Status: DC
  Filled 2019-10-19: qty 10

## 2019-10-19 MED ORDER — TRANEXAMIC ACID 1000 MG/10ML IV SOLN
1000.0000 mg | Freq: Once | INTRAVENOUS | Status: DC
  Filled 2019-10-19: qty 10

## 2019-10-19 NOTE — ED Triage Notes (Signed)
Pt to ED via EMS from [employer] [employer] c/o mouth laceration, apparently pt woke up with blood in her mouth, per EMS appears to be trauma to the tongue, pt does not remember biting it. Pt does not complain of pain. A&O x 4. No medications given by EMS

## 2019-10-19 NOTE — ED Notes (Signed)
[facility] called @ 0935-per William Hamburger, RN called by Levada Dy

## 2019-10-19 NOTE — Discharge Instructions (Addendum)
Jade Juarez had some oozing along her gumline on the lower left side of her mouth.  She may have a cracked tooth here and looks to have some cavities.  I would strongly recommend that she see a dentist for these issues.  Her bleeding was under control in the ER.  We gave her some TXA swish & spit medication to help with clotting in her mouth.

## 2019-10-19 NOTE — ED Provider Notes (Signed)
[facility] EMERGENCY DEPARTMENT Provider Note   CSN: [id number] Arrival date & time: 10/19/19  0827     History CC: blood in mouth  Jade Juarez is a 86 y.o. female with a history of A. fib on Eliquis presented to emergency department with blood in her mouth.  The patient reports he woke up this morning and tasted blood in her mouth.  She is coming from [place] home.  She is on eliquis.  She denies pain in her tongue or teeth.  No significant bleeding or lightheadedness.   HPI     Past Medical History:  Diagnosis Date   Anemia    takes Ferrous Sulfate daily   Anxiety    Arthritis    right knees   Atrial fibrillation ([facility])    prescribed Eliquis but hasn't started-waiting until after this procedure per [place]   Depression    takes Zoloft daily   Diverticulitis    hx of   Frequent UTI    GERD (gastroesophageal reflux disease)    hx of-yrs ago   Headache    occasionally   Heart murmur    High cholesterol    takes Atorvastatin daily   History of bronchitis    about a yr ago   History of colon polyps    Hypertension    takes Diltiazem and Diovan daily   Hypothyroidism    takes Synthroid daily   IBS (irritable bowel syndrome)    PMH   Incontinence    Joint pain    Joint swelling    Nocturia    Overactive bladder    PONV (postoperative nausea and vomiting)    Presence of permanent cardiac pacemaker    Restless leg syndrome    Status post placement of implantable loop recorder 05/2014   Stool incontinence    at times   Stroke [facility])    takes Plavix daily-right sided weakness   Vertebrobasilar artery insufficiency    Wears glasses     Patient Active Problem List   Diagnosis Date Noted   UTI (urinary tract infection) 07/30/2019   Basilar artery stenosis 11/14/2014   Occlusion and stenosis of basilar artery     Past Surgical History:  Procedure Laterality Date   ABDOMINAL HYSTERECTOMY       bladder tack     x 2   BREAST SURGERY Right    cyst removed   COLONOSCOPY     EYE SURGERY Bilateral    cataract surgery   INSERT / REPLACE / REMOVE PACEMAKER     LOOP RECORDER IMPLANT  05/23/2014   Medtronic Reveal loop recorder (Dr. Lisa Roca, [facility])   RADIOLOGY WITH ANESTHESIA N/A 08/15/2014   Procedure: RADIOLOGY WITH ANESTHESIA;  Surgeon: Luanne Bras, MD;  Location: [place];  Service: Radiology;  Laterality: N/A;   RADIOLOGY WITH ANESTHESIA N/A 11/05/2014   Procedure: ANGIOPLASTY WITH POSSIBLE STENTING      (RADIOLOGY WITH ANESTHESIA );  Surgeon: Luanne Bras, MD;  Location: [place];  Service: Radiology;  Laterality: N/A;   RADIOLOGY WITH ANESTHESIA N/A 11/14/2014   Procedure: RADIOLOGY WITH ANESTHESIA;  Surgeon: Luanne Bras, MD;  Location: [employer];  Service: Radiology;  Laterality: N/A;     OB History   No obstetric history on file.     Family History  Problem Relation Age of Onset   Hypertension Mother    Heart attack Mother    Cancer Father     Social History   Tobacco  Use   Smoking status: Never Smoker   Smokeless tobacco: Never Used  Substance Use Topics   Alcohol use: No   Drug use: No    Home Medications Prior to Admission medications   Medication Sig Start Date End Date Taking? Authorizing Provider  amoxicillin (AMOXIL) 500 MG capsule Take 2,000 mg by mouth See admin instructions. 1 hour before dental procedure 06/17/14   [provider]  apixaban (ELIQUIS) 2.5 MG TABS tablet Take 2.5 mg by mouth 2 (two) times daily.    [provider]  atorvastatin (LIPITOR) 10 MG tablet Take 10 mg by mouth daily.    [provider]  calcium carbonate (OS-CAL) 600 MG TABS tablet Take 600 mg by mouth daily. 2 tabs    [provider]  Cholecalciferol (VITAMIN D3) 5000 UNITS CAPS Take 5,000 Units by mouth daily.    [provider]  conjugated estrogens (PREMARIN) vaginal cream Place 1 Applicatorful vaginally  every other day.    [provider]  diclofenac sodium (VOLTAREN) 1 % GEL Apply 2 g topically 2 (two) times daily.    [provider]  diltiazem (CARDIZEM CD) 120 MG 24 hr capsule Take 120 mg by mouth 2 (two) times daily.     [provider]  ferrous sulfate 325 (65 FE) MG tablet Take 325 mg by mouth 2 (two) times a week. Takes on Monday & Wedensday    [provider]  gabapentin (NEURONTIN) 400 MG capsule Take 400 mg by mouth at bedtime.     [provider]  glucosamine-chondroitin 500-400 MG tablet Take 1 tablet by mouth daily.    [provider]  levothyroxine (SYNTHROID, LEVOTHROID) 88 MCG tablet Take 88 mcg by mouth daily before breakfast.    [provider]  montelukast (SINGULAIR) 10 MG tablet Take 10 mg by mouth at bedtime.    [provider]  Multiple Vitamin (MULTIVITAMIN) tablet Take 1 tablet by mouth daily.    [provider]  ondansetron (ZOFRAN ODT) 4 MG disintegrating tablet Take 1 tablet (4 mg total) by mouth every 8 (eight) hours as needed for nausea or vomiting. 08/04/19   Gareth Morgan, MD  potassium chloride SA (KLOR-CON) 20 MEQ tablet Take 1 tablet (20 mEq total) by mouth 2 (two) times daily for 3 days. 07/31/19 08/03/19  Maudie Flakes, MD  sertraline (ZOLOFT) 50 MG tablet Take 50 mg by mouth daily.    [provider]  ticagrelor (BRILINTA) 90 MG TABS tablet Take 45-90 mg by mouth See admin instructions. 11/06/14-11/11/14=90 mg twice a day 11/12/14=90 mg daily 11/13/14=45 mg twice a day 11/14/14=45 mg in the morning... Unknowing dosing thereafter    [provider]  valsartan-hydrochlorothiazide (DIOVAN-HCT) 160-12.5 MG per tablet Take 1 tablet by mouth daily.    [provider]    Allergies    Hibiclens [chlorhexidine gluconate], Benicar [olmesartan], Codeine, Erythromycin, Lipitor [atorvastatin], Other, Tetracyclines & related, Vibramycin [doxycycline], and Vicodin  [hydrocodone-acetaminophen]  Review of Systems   Review of Systems  Constitutional: Negative for chills and fever.  HENT: Positive for mouth sores. Negative for ear pain.   Eyes: Negative for pain and visual disturbance.  Respiratory: Negative for cough and shortness of breath.   Cardiovascular: Negative for chest pain and palpitations.  Gastrointestinal: Negative for abdominal pain and vomiting.  Skin: Negative for pallor and rash.  Neurological: Negative for syncope and light-headedness.  Psychiatric/Behavioral: Negative for agitation and confusion.  All other systems reviewed and are negative.  Physical Exam Updated Vital Signs BP (!) 164/95    Pulse 77    Temp 98 F (36.7 C) (Oral)    Resp 16    Ht 5\' 2"  (1.575 m)    Wt 70.3 kg    SpO2 96%    BMI 28.35 kg/m   Physical Exam Vitals and nursing note reviewed.  Constitutional:      General: She is not in acute distress.    Appearance: She is well-developed.  HENT:     Head: Normocephalic and atraumatic.     Comments: Poor dentitia, multiple missing teeth, carries noted in remaining teeth Left lower molars with oozing along the gumline, cracked enamel, no ellis 3 fracture noted No tongue laceration visible Eyes:     Conjunctiva/sclera: Conjunctivae normal.  Cardiovascular:     Rate and Rhythm: Normal rate.     Pulses: Normal pulses.  Pulmonary:     Effort: Pulmonary effort is normal. No respiratory distress.  Musculoskeletal:     Cervical back: Neck supple.  Skin:    General: Skin is warm and dry.  Neurological:     General: No focal deficit present.     Mental Status: She is alert and oriented to person, place, and time.     ED Results / Procedures / Treatments   Labs (all labs ordered are listed, but only abnormal results are displayed) Labs Reviewed - No data to display  EKG None  Radiology No results found.  Procedures Procedures (including critical care time)  Medications Ordered in ED Medications    tranexamic acid (CYKLOKAPRON) injection 1,000 mg (has no administration in time range)    ED Course  I have reviewed the triage vital signs and the nursing notes.  Pertinent labs & imaging results that were available during my care of the patient were reviewed by me and considered in my medical decision making (see chart for details).  86 yo female presenting with oozing along her left lower gumline and cracked tooth, without significant tenderness to palpation.  I suspect she cracked a tooth last night while sleeping.  There is no significant bleeding here.  We'll try a TXA swish & spit and discharge back to her nursing facility, with advise to see a dentist as soon as possible.   Final Clinical Impression(s) / ED Diagnoses Final diagnoses:  Bleeding in mouth  Dental cavity    Rx / DC Orders ED Discharge Orders    None       Ishaaq Penna, Carola Rhine, MD 10/19/19 [phone]

## 2019-10-20 DIAGNOSIS — R296 Repeated falls: Secondary | ICD-10-CM | POA: Diagnosis not present

## 2019-10-20 DIAGNOSIS — R278 Other lack of coordination: Secondary | ICD-10-CM | POA: Diagnosis not present

## 2019-10-20 DIAGNOSIS — F0390 Unspecified dementia without behavioral disturbance: Secondary | ICD-10-CM | POA: Diagnosis not present

## 2019-10-20 DIAGNOSIS — R1312 Dysphagia, oropharyngeal phase: Secondary | ICD-10-CM | POA: Diagnosis not present

## 2019-10-20 DIAGNOSIS — M6281 Muscle weakness (generalized): Secondary | ICD-10-CM | POA: Diagnosis not present

## 2019-10-20 DIAGNOSIS — R488 Other symbolic dysfunctions: Secondary | ICD-10-CM | POA: Diagnosis not present

## 2019-10-20 DIAGNOSIS — R2681 Unsteadiness on feet: Secondary | ICD-10-CM | POA: Diagnosis not present

## 2019-10-20 DIAGNOSIS — Z79899 Other long term (current) drug therapy: Secondary | ICD-10-CM | POA: Diagnosis not present

## 2019-10-20 DIAGNOSIS — E039 Hypothyroidism, unspecified: Secondary | ICD-10-CM | POA: Diagnosis not present

## 2019-10-20 DIAGNOSIS — R41841 Cognitive communication deficit: Secondary | ICD-10-CM | POA: Diagnosis not present

## 2019-10-20 DIAGNOSIS — E782 Mixed hyperlipidemia: Secondary | ICD-10-CM | POA: Diagnosis not present

## 2019-10-23 DIAGNOSIS — R278 Other lack of coordination: Secondary | ICD-10-CM | POA: Diagnosis not present

## 2019-10-23 DIAGNOSIS — R41841 Cognitive communication deficit: Secondary | ICD-10-CM | POA: Diagnosis not present

## 2019-10-23 DIAGNOSIS — R488 Other symbolic dysfunctions: Secondary | ICD-10-CM | POA: Diagnosis not present

## 2019-10-23 DIAGNOSIS — R296 Repeated falls: Secondary | ICD-10-CM | POA: Diagnosis not present

## 2019-10-23 DIAGNOSIS — R1312 Dysphagia, oropharyngeal phase: Secondary | ICD-10-CM | POA: Diagnosis not present

## 2019-10-23 DIAGNOSIS — M6281 Muscle weakness (generalized): Secondary | ICD-10-CM | POA: Diagnosis not present

## 2019-10-23 DIAGNOSIS — R2681 Unsteadiness on feet: Secondary | ICD-10-CM | POA: Diagnosis not present

## 2019-10-24 DIAGNOSIS — R296 Repeated falls: Secondary | ICD-10-CM | POA: Diagnosis not present

## 2019-10-24 DIAGNOSIS — R278 Other lack of coordination: Secondary | ICD-10-CM | POA: Diagnosis not present

## 2019-10-24 DIAGNOSIS — R2681 Unsteadiness on feet: Secondary | ICD-10-CM | POA: Diagnosis not present

## 2019-10-24 DIAGNOSIS — R488 Other symbolic dysfunctions: Secondary | ICD-10-CM | POA: Diagnosis not present

## 2019-10-24 DIAGNOSIS — R1312 Dysphagia, oropharyngeal phase: Secondary | ICD-10-CM | POA: Diagnosis not present

## 2019-10-24 DIAGNOSIS — R41841 Cognitive communication deficit: Secondary | ICD-10-CM | POA: Diagnosis not present

## 2019-10-24 DIAGNOSIS — M6281 Muscle weakness (generalized): Secondary | ICD-10-CM | POA: Diagnosis not present

## 2019-10-25 DIAGNOSIS — R1312 Dysphagia, oropharyngeal phase: Secondary | ICD-10-CM | POA: Diagnosis not present

## 2019-10-25 DIAGNOSIS — E8809 Other disorders of plasma-protein metabolism, not elsewhere classified: Secondary | ICD-10-CM | POA: Diagnosis not present

## 2019-10-25 DIAGNOSIS — R278 Other lack of coordination: Secondary | ICD-10-CM | POA: Diagnosis not present

## 2019-10-25 DIAGNOSIS — R296 Repeated falls: Secondary | ICD-10-CM | POA: Diagnosis not present

## 2019-10-25 DIAGNOSIS — N183 Chronic kidney disease, stage 3 unspecified: Secondary | ICD-10-CM | POA: Diagnosis not present

## 2019-10-25 DIAGNOSIS — R2681 Unsteadiness on feet: Secondary | ICD-10-CM | POA: Diagnosis not present

## 2019-10-25 DIAGNOSIS — R41841 Cognitive communication deficit: Secondary | ICD-10-CM | POA: Diagnosis not present

## 2019-10-25 DIAGNOSIS — D631 Anemia in chronic kidney disease: Secondary | ICD-10-CM | POA: Diagnosis not present

## 2019-10-25 DIAGNOSIS — R488 Other symbolic dysfunctions: Secondary | ICD-10-CM | POA: Diagnosis not present

## 2019-10-25 DIAGNOSIS — E7849 Other hyperlipidemia: Secondary | ICD-10-CM | POA: Diagnosis not present

## 2019-10-25 DIAGNOSIS — M6281 Muscle weakness (generalized): Secondary | ICD-10-CM | POA: Diagnosis not present

## 2019-10-25 DIAGNOSIS — R7401 Elevation of levels of liver transaminase levels: Secondary | ICD-10-CM | POA: Diagnosis not present

## 2019-10-25 DIAGNOSIS — E876 Hypokalemia: Secondary | ICD-10-CM | POA: Diagnosis not present

## 2019-10-26 DIAGNOSIS — R1312 Dysphagia, oropharyngeal phase: Secondary | ICD-10-CM | POA: Diagnosis not present

## 2019-10-26 DIAGNOSIS — R488 Other symbolic dysfunctions: Secondary | ICD-10-CM | POA: Diagnosis not present

## 2019-10-26 DIAGNOSIS — M6281 Muscle weakness (generalized): Secondary | ICD-10-CM | POA: Diagnosis not present

## 2019-10-26 DIAGNOSIS — R296 Repeated falls: Secondary | ICD-10-CM | POA: Diagnosis not present

## 2019-10-26 DIAGNOSIS — R41841 Cognitive communication deficit: Secondary | ICD-10-CM | POA: Diagnosis not present

## 2019-10-26 DIAGNOSIS — R2681 Unsteadiness on feet: Secondary | ICD-10-CM | POA: Diagnosis not present

## 2019-10-26 DIAGNOSIS — R278 Other lack of coordination: Secondary | ICD-10-CM | POA: Diagnosis not present

## 2019-10-27 DIAGNOSIS — R41841 Cognitive communication deficit: Secondary | ICD-10-CM | POA: Diagnosis not present

## 2019-10-27 DIAGNOSIS — R488 Other symbolic dysfunctions: Secondary | ICD-10-CM | POA: Diagnosis not present

## 2019-10-27 DIAGNOSIS — R2681 Unsteadiness on feet: Secondary | ICD-10-CM | POA: Diagnosis not present

## 2019-10-27 DIAGNOSIS — R1312 Dysphagia, oropharyngeal phase: Secondary | ICD-10-CM | POA: Diagnosis not present

## 2019-10-27 DIAGNOSIS — R278 Other lack of coordination: Secondary | ICD-10-CM | POA: Diagnosis not present

## 2019-10-27 DIAGNOSIS — M6281 Muscle weakness (generalized): Secondary | ICD-10-CM | POA: Diagnosis not present

## 2019-10-27 DIAGNOSIS — R296 Repeated falls: Secondary | ICD-10-CM | POA: Diagnosis not present

## 2019-10-30 DIAGNOSIS — R1312 Dysphagia, oropharyngeal phase: Secondary | ICD-10-CM | POA: Diagnosis not present

## 2019-10-30 DIAGNOSIS — R41841 Cognitive communication deficit: Secondary | ICD-10-CM | POA: Diagnosis not present

## 2019-10-30 DIAGNOSIS — R2681 Unsteadiness on feet: Secondary | ICD-10-CM | POA: Diagnosis not present

## 2019-10-30 DIAGNOSIS — R278 Other lack of coordination: Secondary | ICD-10-CM | POA: Diagnosis not present

## 2019-10-30 DIAGNOSIS — R296 Repeated falls: Secondary | ICD-10-CM | POA: Diagnosis not present

## 2019-10-30 DIAGNOSIS — M6281 Muscle weakness (generalized): Secondary | ICD-10-CM | POA: Diagnosis not present

## 2019-10-30 DIAGNOSIS — R488 Other symbolic dysfunctions: Secondary | ICD-10-CM | POA: Diagnosis not present

## 2019-10-31 DIAGNOSIS — R296 Repeated falls: Secondary | ICD-10-CM | POA: Diagnosis not present

## 2019-10-31 DIAGNOSIS — R41841 Cognitive communication deficit: Secondary | ICD-10-CM | POA: Diagnosis not present

## 2019-10-31 DIAGNOSIS — R278 Other lack of coordination: Secondary | ICD-10-CM | POA: Diagnosis not present

## 2019-10-31 DIAGNOSIS — M6281 Muscle weakness (generalized): Secondary | ICD-10-CM | POA: Diagnosis not present

## 2019-10-31 DIAGNOSIS — R2681 Unsteadiness on feet: Secondary | ICD-10-CM | POA: Diagnosis not present

## 2019-10-31 DIAGNOSIS — R1312 Dysphagia, oropharyngeal phase: Secondary | ICD-10-CM | POA: Diagnosis not present

## 2019-10-31 DIAGNOSIS — R488 Other symbolic dysfunctions: Secondary | ICD-10-CM | POA: Diagnosis not present

## 2019-11-01 DIAGNOSIS — R2681 Unsteadiness on feet: Secondary | ICD-10-CM | POA: Diagnosis not present

## 2019-11-01 DIAGNOSIS — R1312 Dysphagia, oropharyngeal phase: Secondary | ICD-10-CM | POA: Diagnosis not present

## 2019-11-01 DIAGNOSIS — R278 Other lack of coordination: Secondary | ICD-10-CM | POA: Diagnosis not present

## 2019-11-01 DIAGNOSIS — M6281 Muscle weakness (generalized): Secondary | ICD-10-CM | POA: Diagnosis not present

## 2019-11-01 DIAGNOSIS — R296 Repeated falls: Secondary | ICD-10-CM | POA: Diagnosis not present

## 2019-11-01 DIAGNOSIS — M25561 Pain in right knee: Secondary | ICD-10-CM | POA: Diagnosis not present

## 2019-11-01 DIAGNOSIS — R41841 Cognitive communication deficit: Secondary | ICD-10-CM | POA: Diagnosis not present

## 2019-11-01 DIAGNOSIS — R488 Other symbolic dysfunctions: Secondary | ICD-10-CM | POA: Diagnosis not present

## 2019-11-02 DIAGNOSIS — R296 Repeated falls: Secondary | ICD-10-CM | POA: Diagnosis not present

## 2019-11-02 DIAGNOSIS — R1312 Dysphagia, oropharyngeal phase: Secondary | ICD-10-CM | POA: Diagnosis not present

## 2019-11-02 DIAGNOSIS — R41841 Cognitive communication deficit: Secondary | ICD-10-CM | POA: Diagnosis not present

## 2019-11-02 DIAGNOSIS — M6281 Muscle weakness (generalized): Secondary | ICD-10-CM | POA: Diagnosis not present

## 2019-11-02 DIAGNOSIS — R278 Other lack of coordination: Secondary | ICD-10-CM | POA: Diagnosis not present

## 2019-11-02 DIAGNOSIS — R2681 Unsteadiness on feet: Secondary | ICD-10-CM | POA: Diagnosis not present

## 2019-11-02 DIAGNOSIS — R488 Other symbolic dysfunctions: Secondary | ICD-10-CM | POA: Diagnosis not present

## 2019-11-03 DIAGNOSIS — R41841 Cognitive communication deficit: Secondary | ICD-10-CM | POA: Diagnosis not present

## 2019-11-03 DIAGNOSIS — R296 Repeated falls: Secondary | ICD-10-CM | POA: Diagnosis not present

## 2019-11-03 DIAGNOSIS — M6281 Muscle weakness (generalized): Secondary | ICD-10-CM | POA: Diagnosis not present

## 2019-11-03 DIAGNOSIS — R488 Other symbolic dysfunctions: Secondary | ICD-10-CM | POA: Diagnosis not present

## 2019-11-03 DIAGNOSIS — R1312 Dysphagia, oropharyngeal phase: Secondary | ICD-10-CM | POA: Diagnosis not present

## 2019-11-03 DIAGNOSIS — R2681 Unsteadiness on feet: Secondary | ICD-10-CM | POA: Diagnosis not present

## 2019-11-03 DIAGNOSIS — R278 Other lack of coordination: Secondary | ICD-10-CM | POA: Diagnosis not present

## 2019-11-06 DIAGNOSIS — M6281 Muscle weakness (generalized): Secondary | ICD-10-CM | POA: Diagnosis not present

## 2019-11-06 DIAGNOSIS — R296 Repeated falls: Secondary | ICD-10-CM | POA: Diagnosis not present

## 2019-11-06 DIAGNOSIS — R1312 Dysphagia, oropharyngeal phase: Secondary | ICD-10-CM | POA: Diagnosis not present

## 2019-11-06 DIAGNOSIS — R2681 Unsteadiness on feet: Secondary | ICD-10-CM | POA: Diagnosis not present

## 2019-11-06 DIAGNOSIS — R488 Other symbolic dysfunctions: Secondary | ICD-10-CM | POA: Diagnosis not present

## 2019-11-06 DIAGNOSIS — R41841 Cognitive communication deficit: Secondary | ICD-10-CM | POA: Diagnosis not present

## 2019-11-06 DIAGNOSIS — R278 Other lack of coordination: Secondary | ICD-10-CM | POA: Diagnosis not present

## 2019-11-07 DIAGNOSIS — M6281 Muscle weakness (generalized): Secondary | ICD-10-CM | POA: Diagnosis not present

## 2019-11-07 DIAGNOSIS — R41841 Cognitive communication deficit: Secondary | ICD-10-CM | POA: Diagnosis not present

## 2019-11-07 DIAGNOSIS — R488 Other symbolic dysfunctions: Secondary | ICD-10-CM | POA: Diagnosis not present

## 2019-11-07 DIAGNOSIS — R1312 Dysphagia, oropharyngeal phase: Secondary | ICD-10-CM | POA: Diagnosis not present

## 2019-11-07 DIAGNOSIS — R2681 Unsteadiness on feet: Secondary | ICD-10-CM | POA: Diagnosis not present

## 2019-11-07 DIAGNOSIS — R278 Other lack of coordination: Secondary | ICD-10-CM | POA: Diagnosis not present

## 2019-11-07 DIAGNOSIS — R296 Repeated falls: Secondary | ICD-10-CM | POA: Diagnosis not present

## 2019-11-08 DIAGNOSIS — R41841 Cognitive communication deficit: Secondary | ICD-10-CM | POA: Diagnosis not present

## 2019-11-08 DIAGNOSIS — M6281 Muscle weakness (generalized): Secondary | ICD-10-CM | POA: Diagnosis not present

## 2019-11-08 DIAGNOSIS — R488 Other symbolic dysfunctions: Secondary | ICD-10-CM | POA: Diagnosis not present

## 2019-11-08 DIAGNOSIS — R1312 Dysphagia, oropharyngeal phase: Secondary | ICD-10-CM | POA: Diagnosis not present

## 2019-11-08 DIAGNOSIS — I129 Hypertensive chronic kidney disease with stage 1 through stage 4 chronic kidney disease, or unspecified chronic kidney disease: Secondary | ICD-10-CM | POA: Diagnosis not present

## 2019-11-08 DIAGNOSIS — R32 Unspecified urinary incontinence: Secondary | ICD-10-CM | POA: Diagnosis not present

## 2019-11-08 DIAGNOSIS — R278 Other lack of coordination: Secondary | ICD-10-CM | POA: Diagnosis not present

## 2019-11-08 DIAGNOSIS — M1711 Unilateral primary osteoarthritis, right knee: Secondary | ICD-10-CM | POA: Diagnosis not present

## 2019-11-08 DIAGNOSIS — R2681 Unsteadiness on feet: Secondary | ICD-10-CM | POA: Diagnosis not present

## 2019-11-08 DIAGNOSIS — F0150 Vascular dementia without behavioral disturbance: Secondary | ICD-10-CM | POA: Diagnosis not present

## 2019-11-08 DIAGNOSIS — R269 Unspecified abnormalities of gait and mobility: Secondary | ICD-10-CM | POA: Diagnosis not present

## 2019-11-08 DIAGNOSIS — R296 Repeated falls: Secondary | ICD-10-CM | POA: Diagnosis not present

## 2019-11-08 DIAGNOSIS — N3000 Acute cystitis without hematuria: Secondary | ICD-10-CM | POA: Diagnosis not present

## 2019-11-08 DIAGNOSIS — L989 Disorder of the skin and subcutaneous tissue, unspecified: Secondary | ICD-10-CM | POA: Diagnosis not present

## 2019-11-08 DIAGNOSIS — G629 Polyneuropathy, unspecified: Secondary | ICD-10-CM | POA: Diagnosis not present

## 2019-11-08 DIAGNOSIS — I651 Occlusion and stenosis of basilar artery: Secondary | ICD-10-CM | POA: Diagnosis not present

## 2019-11-09 DIAGNOSIS — R1312 Dysphagia, oropharyngeal phase: Secondary | ICD-10-CM | POA: Diagnosis not present

## 2019-11-09 DIAGNOSIS — R296 Repeated falls: Secondary | ICD-10-CM | POA: Diagnosis not present

## 2019-11-09 DIAGNOSIS — R2681 Unsteadiness on feet: Secondary | ICD-10-CM | POA: Diagnosis not present

## 2019-11-09 DIAGNOSIS — M6281 Muscle weakness (generalized): Secondary | ICD-10-CM | POA: Diagnosis not present

## 2019-11-09 DIAGNOSIS — R488 Other symbolic dysfunctions: Secondary | ICD-10-CM | POA: Diagnosis not present

## 2019-11-09 DIAGNOSIS — R41841 Cognitive communication deficit: Secondary | ICD-10-CM | POA: Diagnosis not present

## 2019-11-09 DIAGNOSIS — R278 Other lack of coordination: Secondary | ICD-10-CM | POA: Diagnosis not present

## 2019-11-10 DIAGNOSIS — R1312 Dysphagia, oropharyngeal phase: Secondary | ICD-10-CM | POA: Diagnosis not present

## 2019-11-10 DIAGNOSIS — R278 Other lack of coordination: Secondary | ICD-10-CM | POA: Diagnosis not present

## 2019-11-10 DIAGNOSIS — M6281 Muscle weakness (generalized): Secondary | ICD-10-CM | POA: Diagnosis not present

## 2019-11-10 DIAGNOSIS — R296 Repeated falls: Secondary | ICD-10-CM | POA: Diagnosis not present

## 2019-11-10 DIAGNOSIS — R41841 Cognitive communication deficit: Secondary | ICD-10-CM | POA: Diagnosis not present

## 2019-11-10 DIAGNOSIS — R2681 Unsteadiness on feet: Secondary | ICD-10-CM | POA: Diagnosis not present

## 2019-11-10 DIAGNOSIS — R488 Other symbolic dysfunctions: Secondary | ICD-10-CM | POA: Diagnosis not present

## 2019-11-12 ENCOUNTER — Other Ambulatory Visit: Payer: Self-pay

## 2019-11-12 ENCOUNTER — Emergency Department (HOSPITAL_COMMUNITY)
Admission: EM | Admit: 2019-11-12 | Discharge: 2019-11-12 | Disposition: A | Discharge status: Home / Self Care | Payer: PPO | Attending: Emergency Medicine | Admitting: Emergency Medicine

## 2019-11-12 ENCOUNTER — Emergency Department (HOSPITAL_COMMUNITY): Payer: PPO

## 2019-11-12 ENCOUNTER — Encounter (HOSPITAL_COMMUNITY): Payer: Self-pay | Admitting: Emergency Medicine

## 2019-11-12 DIAGNOSIS — Y929 Unspecified place or not applicable: Secondary | ICD-10-CM | POA: Diagnosis not present

## 2019-11-12 DIAGNOSIS — R52 Pain, unspecified: Secondary | ICD-10-CM | POA: Diagnosis not present

## 2019-11-12 DIAGNOSIS — M25471 Effusion, right ankle: Secondary | ICD-10-CM

## 2019-11-12 DIAGNOSIS — S4991XA Unspecified injury of right shoulder and upper arm, initial encounter: Secondary | ICD-10-CM | POA: Diagnosis not present

## 2019-11-12 DIAGNOSIS — S99911A Unspecified injury of right ankle, initial encounter: Secondary | ICD-10-CM | POA: Diagnosis not present

## 2019-11-12 DIAGNOSIS — Y939 Activity, unspecified: Secondary | ICD-10-CM | POA: Diagnosis not present

## 2019-11-12 DIAGNOSIS — R2241 Localized swelling, mass and lump, right lower limb: Secondary | ICD-10-CM | POA: Diagnosis not present

## 2019-11-12 DIAGNOSIS — Z87891 Personal history of nicotine dependence: Secondary | ICD-10-CM | POA: Diagnosis not present

## 2019-11-12 DIAGNOSIS — W19XXXA Unspecified fall, initial encounter: Secondary | ICD-10-CM | POA: Insufficient documentation

## 2019-11-12 DIAGNOSIS — R112 Nausea with vomiting, unspecified: Secondary | ICD-10-CM | POA: Insufficient documentation

## 2019-11-12 DIAGNOSIS — S0003XA Contusion of scalp, initial encounter: Secondary | ICD-10-CM

## 2019-11-12 DIAGNOSIS — S199XXA Unspecified injury of neck, initial encounter: Secondary | ICD-10-CM | POA: Diagnosis not present

## 2019-11-12 DIAGNOSIS — I10 Essential (primary) hypertension: Secondary | ICD-10-CM | POA: Diagnosis not present

## 2019-11-12 DIAGNOSIS — Y999 Unspecified external cause status: Secondary | ICD-10-CM | POA: Insufficient documentation

## 2019-11-12 DIAGNOSIS — S0990XA Unspecified injury of head, initial encounter: Secondary | ICD-10-CM | POA: Diagnosis not present

## 2019-11-12 DIAGNOSIS — E039 Hypothyroidism, unspecified: Secondary | ICD-10-CM | POA: Insufficient documentation

## 2019-11-12 DIAGNOSIS — S0000XA Unspecified superficial injury of scalp, initial encounter: Secondary | ICD-10-CM | POA: Diagnosis present

## 2019-11-12 DIAGNOSIS — M542 Cervicalgia: Secondary | ICD-10-CM | POA: Diagnosis not present

## 2019-11-12 DIAGNOSIS — Z79890 Hormone replacement therapy (postmenopausal): Secondary | ICD-10-CM | POA: Diagnosis not present

## 2019-11-12 DIAGNOSIS — M7989 Other specified soft tissue disorders: Secondary | ICD-10-CM | POA: Diagnosis not present

## 2019-11-12 DIAGNOSIS — M25511 Pain in right shoulder: Secondary | ICD-10-CM | POA: Diagnosis not present

## 2019-11-12 LAB — COMPREHENSIVE METABOLIC PANEL
ALT: 81 U/L — ABNORMAL HIGH (ref 0–44)
AST: 90 U/L — ABNORMAL HIGH (ref 15–41)
Albumin: 2.8 g/dL — ABNORMAL LOW (ref 3.5–5.0)
Alkaline Phosphatase: 69 U/L (ref 38–126)
Anion gap: 10 (ref 5–15)
BUN: 10 mg/dL (ref 8–23)
CO2: 28 mmol/L (ref 22–32)
Calcium: 9 mg/dL (ref 8.9–10.3)
Chloride: 100 mmol/L (ref 98–111)
Creatinine, Ser: 1.17 mg/dL — ABNORMAL HIGH (ref 0.44–1.00)
GFR calc Af Amer: 47 mL/min — ABNORMAL LOW (ref >60)
GFR calc non Af Amer: 41 mL/min — ABNORMAL LOW (ref >60)
Glucose, Bld: 103 mg/dL — ABNORMAL HIGH (ref 70–99)
Potassium: 3.3 mmol/L — ABNORMAL LOW (ref 3.5–5.1)
Sodium: 138 mmol/L (ref 135–145)
Total Bilirubin: 0.7 mg/dL (ref 0.3–1.2)
Total Protein: 6.1 g/dL — ABNORMAL LOW (ref 6.5–8.1)

## 2019-11-12 LAB — CBC
HCT: 34.1 % — ABNORMAL LOW (ref 36.0–46.0)
Hemoglobin: 11.4 g/dL — ABNORMAL LOW (ref 12.0–15.0)
MCH: 30 pg (ref 26.0–34.0)
MCHC: 33.4 g/dL (ref 30.0–36.0)
MCV: 89.7 fL (ref 80.0–100.0)
Platelets: 185 10*3/uL (ref 150–400)
RBC: 3.8 MIL/uL — ABNORMAL LOW (ref 3.87–5.11)
RDW: 15.2 % (ref 11.5–15.5)
WBC: 6.4 10*3/uL (ref 4.0–10.5)
nRBC: 0 % (ref 0.0–0.2)

## 2019-11-12 LAB — LIPASE, BLOOD: Lipase: 18 U/L (ref 11–51)

## 2019-11-12 MED ORDER — ONDANSETRON 4 MG PO TBDP
4.0000 mg | ORAL_TABLET | Freq: Once | ORAL | Status: AC
  Administered 2019-11-12: 4 mg via ORAL
  Filled 2019-11-12: qty 1

## 2019-11-12 NOTE — ED Notes (Signed)
Called [facility] for transport.  

## 2019-11-12 NOTE — Discharge Instructions (Addendum)
Return to emergency department if any headache or change in mental status You had a CT that did not show any bleeding here in the emergency department, but blood thinners increase your risk of having bleeding in your head. Your liver function tests were slightly elevated.  Please follow-up with your primary care doctor this week for further evaluation.  Return to the emergency department if you are unable to tolerate liquids or feel worse anytime

## 2019-11-12 NOTE — ED Triage Notes (Signed)
Pt to triage via [facility] from [facility].  Pt was sitting in wheelchair this morning and reaching to get a book.  She fell forward and hit her head.  Takes Eliquis.  No obvious injury.  Initially reported neck pain but states it resolved prior to arrival to hospital.  Pt has a history of generalized weakness and heaving. Denies any complaints at present.  Alert and oriented.

## 2019-11-12 NOTE — ED Provider Notes (Signed)
[facility] EMERGENCY DEPARTMENT Provider Note   CSN: [id number] Arrival date & time: 11/12/19  6195     History Chief Complaint  Patient presents with  . Fall    Jade Juarez is a 86 y.o. female.  HPI     86 year old female presents today from assisted living facility with fall.  She is on Eliquis.  She is nonambulatory.  She reports that she was reaching backwards at the chair she was sitting in the triangle of book when she tumbled from the chair.  Her daughter-in-law is also at the bedside gives additional history.  The patient has been on twice in the past several weeks.  2 times or from the chair when she overbalanced and one time when they were attempting to transfer her.  She did strike her head during the fall but denies loss conscious.  She is on Eliquis.  She has bruises of both of her knees and some pain and swelling of her right ankle.  Her daughter-in-law also reports that she has had nausea and vomiting has not been taking p.o. well.  Her primary care doctor has plan to refer her to GI for follow-up.  No fever, chills, weight loss has been noted.  She has previously received her Covid vaccine.  Past Medical History:  Diagnosis Date  . Anemia    takes Ferrous Sulfate daily  . Anxiety   . Arthritis    right knees  . Atrial fibrillation ([place])    prescribed Eliquis but hasn't started-waiting until after this procedure per [place]  . Depression    takes Zoloft daily  . Diverticulitis    hx of  . Frequent UTI   . GERD (gastroesophageal reflux disease)    hx of-yrs ago  . Headache    occasionally  . Heart murmur   . High cholesterol    takes Atorvastatin daily  . History of bronchitis    about a yr ago  . History of colon polyps   . Hypertension    takes Diltiazem and Diovan daily  . Hypothyroidism    takes Synthroid daily  . IBS (irritable bowel syndrome)    PMH  . Incontinence   . Joint pain   . Joint swelling   . Nocturia   .  Overactive bladder   . PONV (postoperative nausea and vomiting)   . Presence of permanent cardiac pacemaker   . Restless leg syndrome   . Status post placement of implantable loop recorder 05/2014  . Stool incontinence    at times  . Stroke [facility])    takes Plavix daily-right sided weakness  . Vertebrobasilar artery insufficiency   . Wears glasses     Patient Active Problem List   Diagnosis Date Noted  . UTI (urinary tract infection) 07/30/2019  . Basilar artery stenosis 11/14/2014  . Occlusion and stenosis of basilar artery     Past Surgical History:  Procedure Laterality Date  . ABDOMINAL HYSTERECTOMY    . bladder tack     x 2  . BREAST SURGERY Right    cyst removed  . COLONOSCOPY    . EYE SURGERY Bilateral    cataract surgery  . INSERT / REPLACE / REMOVE PACEMAKER    . LOOP RECORDER IMPLANT  05/23/2014   [employer] Reveal loop recorder (Dr. Lisa Roca, [facility])  . RADIOLOGY WITH ANESTHESIA N/A 08/15/2014   Procedure: RADIOLOGY WITH ANESTHESIA;  Surgeon: Luanne Bras, MD;  Location: [facility];  Service: Radiology;  Laterality: N/A;  . RADIOLOGY WITH ANESTHESIA N/A 11/05/2014   Procedure: ANGIOPLASTY WITH POSSIBLE STENTING      (RADIOLOGY WITH ANESTHESIA );  Surgeon: Luanne Bras, MD;  Location: [place];  Service: Radiology;  Laterality: N/A;  . RADIOLOGY WITH ANESTHESIA N/A 11/14/2014   Procedure: RADIOLOGY WITH ANESTHESIA;  Surgeon: Luanne Bras, MD;  Location: [facility];  Service: Radiology;  Laterality: N/A;     OB History   No obstetric history on file.     Family History  Problem Relation Age of Onset  . Hypertension Mother   . Heart attack Mother   . Cancer Father     Social History   Tobacco Use  . Smoking status: Never Smoker  . Smokeless tobacco: Never Used  Substance Use Topics  . Alcohol use: No  . Drug use: No    Home Medications Prior to Admission medications   Medication Sig Start Date End Date Taking? Authorizing Provider  amoxicillin  (AMOXIL) 500 MG capsule Take 2,000 mg by mouth See admin instructions. 1 hour before dental procedure 06/17/14   [provider]  apixaban (ELIQUIS) 2.5 MG TABS tablet Take 2.5 mg by mouth 2 (two) times daily.    [provider]  atorvastatin (LIPITOR) 10 MG tablet Take 10 mg by mouth daily.    [provider]  calcium carbonate (OS-CAL) 600 MG TABS tablet Take 600 mg by mouth daily. 2 tabs    [provider]  Cholecalciferol (VITAMIN D3) 5000 UNITS CAPS Take 5,000 Units by mouth daily.    [provider]  conjugated estrogens (PREMARIN) vaginal cream Place 1 Applicatorful vaginally every other day.    [provider]  diclofenac sodium (VOLTAREN) 1 % GEL Apply 2 g topically 2 (two) times daily.    [provider]  diltiazem (CARDIZEM CD) 120 MG 24 hr capsule Take 120 mg by mouth 2 (two) times daily.     [provider]  ferrous sulfate 325 (65 FE) MG tablet Take 325 mg by mouth 2 (two) times a week. Takes on Monday & Wedensday    [provider]  gabapentin (NEURONTIN) 400 MG capsule Take 400 mg by mouth at bedtime.     [provider]  glucosamine-chondroitin 500-400 MG tablet Take 1 tablet by mouth daily.    [provider]  levothyroxine (SYNTHROID, LEVOTHROID) 88 MCG tablet Take 88 mcg by mouth daily before breakfast.    [provider]  montelukast (SINGULAIR) 10 MG tablet Take 10 mg by mouth at bedtime.    [provider]  Multiple Vitamin (MULTIVITAMIN) tablet Take 1 tablet by mouth daily.    [provider]  ondansetron (ZOFRAN ODT) 4 MG disintegrating tablet Take 1 tablet (4 mg total) by mouth every 8 (eight) hours as needed for nausea or vomiting. 08/04/19   Gareth Morgan, MD  potassium chloride SA (KLOR-CON) 20 MEQ tablet Take 1 tablet (20 mEq total) by mouth 2 (two) times daily for 3 days. 07/31/19 08/03/19  Maudie Flakes, MD  sertraline (ZOLOFT) 50 MG tablet  Take 50 mg by mouth daily.    [provider]  ticagrelor (BRILINTA) 90 MG TABS tablet Take 45-90 mg by mouth See admin instructions. 11/06/14-11/11/14=90 mg twice a day 11/12/14=90 mg daily 11/13/14=45 mg twice a day 11/14/14=45 mg in the morning... Unknowing dosing thereafter    [provider]  valsartan-hydrochlorothiazide (DIOVAN-HCT) 160-12.5 MG per tablet Take 1 tablet by mouth daily.    [provider]    Allergies    Hibiclens [chlorhexidine gluconate], Benicar [olmesartan], Codeine, Erythromycin, Lipitor [atorvastatin], Other, Tetracyclines & related, Vibramycin [doxycycline], and Vicodin [hydrocodone-acetaminophen]  Review of Systems   Review of Systems  All other systems reviewed and are negative.   Physical Exam Updated Vital Signs BP (!) 155/90 (BP Location: Right Arm)   Pulse 71   Temp 97.7 F (36.5 C) (Oral)   Resp 16   SpO2 100%   Physical Exam Vitals and nursing note reviewed.  Constitutional:      Appearance: Normal appearance.  HENT:     Head: Normocephalic.     Comments: Mild tenderness to the occiput    Right Ear: External ear normal.     Left Ear: External ear normal.     Nose: Nose normal.     Mouth/Throat:     Mouth: Mucous membranes are moist.  Eyes:     Extraocular Movements: Extraocular movements intact.     Pupils: Pupils are equal, round, and reactive to light.  Cardiovascular:     Rate and Rhythm: Normal rate and regular rhythm.     Pulses: Normal pulses.  Pulmonary:     Effort: Pulmonary effort is normal.  Abdominal:     General: Abdomen is flat. There is no distension.     Palpations: Abdomen is soft.     Tenderness: There is no abdominal tenderness.  Musculoskeletal:        General: Normal range of motion.     Cervical back: Normal range of motion.  Skin:    General: Skin is warm.     Capillary Refill: Capillary refill takes less than 2 seconds.  Neurological:     General: No focal deficit present.     Mental  Status: She is alert.  Psychiatric:        Mood and Affect: Mood normal.     ED Results / Procedures / Treatments   Labs (all labs ordered are listed, but only abnormal results are displayed) Labs Reviewed  CBC - Abnormal; Notable for the following components:      Result Value   RBC 3.80 (*)    Hemoglobin 11.4 (*)    HCT 34.1 (*)    All other components within normal limits  COMPREHENSIVE METABOLIC PANEL - Abnormal; Notable for the following components:   Potassium 3.3 (*)    Glucose, Bld 103 (*)    Creatinine, Ser 1.17 (*)    Total Protein 6.1 (*)    Albumin 2.8 (*)    AST 90 (*)    ALT 81 (*)    GFR calc non Af Amer 41 (*)    GFR calc Af Amer 47 (*)    All other components within normal limits  LIPASE, BLOOD    EKG None  Radiology DG Clavicle Right  Result Date: 11/12/2019 CLINICAL DATA:  Right clavicle pain after fall EXAM: RIGHT CLAVICLE - 2+ VIEWS COMPARISON:  05/17/2019 right shoulder radiographs FINDINGS: No right clavicle fracture. No focal osseous lesions. No evidence of acromioclavicular separation. Mild right AC joint osteoarthritis. Partially visualized 2 lead left subclavian pacemaker. IMPRESSION: No right clavicle fracture. Mild right AC joint osteoarthritis. Electronically Signed   By: Ilona Sorrel M.D.   On: 11/12/2019 13:11   DG Ankle 2 Views Right  Result Date: 11/12/2019 CLINICAL DATA:  Fall, right ankle. EXAM: RIGHT ANKLE - 2 VIEW COMPARISON:  None. FINDINGS: Moderate lateral right ankle soft tissue swelling. No fracture or subluxation. Small plantar right  calcaneal spur. No suspicious focal osseous lesion. No radiopaque foreign body. IMPRESSION: Moderate lateral right ankle soft tissue swelling, with no fracture or subluxation. Electronically Signed   By: Ilona Sorrel M.D.   On: 11/12/2019 13:13   CT Head Wo Contrast  Result Date: 11/12/2019 CLINICAL DATA:  Fall from wheelchair, blood thinner EXAM: CT HEAD WITHOUT CONTRAST CT CERVICAL SPINE WITHOUT  CONTRAST TECHNIQUE: Multidetector CT imaging of the head and cervical spine was performed following the standard protocol without intravenous contrast. Multiplanar CT image reconstructions of the cervical spine were also generated. COMPARISON:  05/17/2019 FINDINGS: CT HEAD FINDINGS Brain: No evidence of acute infarction, hemorrhage, hydrocephalus, extra-axial collection or mass lesion/mass effect. Very extensive periventricular and deep white matter hypodensity with moderate global volume loss. Nonacute lacunar infarction of the left lentiform nuclei. Vascular: No hyperdense vessel or unexpected calcification. Skull: Normal. Negative for fracture or focal lesion. Sinuses/Orbits: No acute finding. Other: None. CT CERVICAL SPINE FINDINGS Alignment: Straightening of the normal cervical lordosis. Skull base and vertebrae: No acute fracture. No primary bone lesion or focal pathologic process. Soft tissues and spinal canal: No prevertebral fluid or swelling. No visible canal hematoma. Disc levels: Moderate to severe multilevel disc degenerative disease of the cervical spine. Upper chest: Negative. Other: None. IMPRESSION: 1. No acute intracranial pathology. 2. Advanced small-vessel white matter disease and global volume loss. Nonacute lacunar infarction of the left lentiform nuclei. 3. No fracture or static subluxation of the cervical spine. 4. Moderate to severe multilevel disc degenerative disease of the cervical spine. Electronically Signed   By: Eddie Candle M.D.   On: 11/12/2019 13:28   CT Cervical Spine Wo Contrast  Result Date: 11/12/2019 CLINICAL DATA:  Fall from wheelchair, blood thinner EXAM: CT HEAD WITHOUT CONTRAST CT CERVICAL SPINE WITHOUT CONTRAST TECHNIQUE: Multidetector CT imaging of the head and cervical spine was performed following the standard protocol without intravenous contrast. Multiplanar CT image reconstructions of the cervical spine were also generated. COMPARISON:  05/17/2019 FINDINGS: CT  HEAD FINDINGS Brain: No evidence of acute infarction, hemorrhage, hydrocephalus, extra-axial collection or mass lesion/mass effect. Very extensive periventricular and deep white matter hypodensity with moderate global volume loss. Nonacute lacunar infarction of the left lentiform nuclei. Vascular: No hyperdense vessel or unexpected calcification. Skull: Normal. Negative for fracture or focal lesion. Sinuses/Orbits: No acute finding. Other: None. CT CERVICAL SPINE FINDINGS Alignment: Straightening of the normal cervical lordosis. Skull base and vertebrae: No acute fracture. No primary bone lesion or focal pathologic process. Soft tissues and spinal canal: No prevertebral fluid or swelling. No visible canal hematoma. Disc levels: Moderate to severe multilevel disc degenerative disease of the cervical spine. Upper chest: Negative. Other: None. IMPRESSION: 1. No acute intracranial pathology. 2. Advanced small-vessel white matter disease and global volume loss. Nonacute lacunar infarction of the left lentiform nuclei. 3. No fracture or static subluxation of the cervical spine. 4. Moderate to severe multilevel disc degenerative disease of the cervical spine. Electronically Signed   By: Eddie Candle M.D.   On: 11/12/2019 13:28    Procedures Procedures (including critical care time)  Medications Ordered in ED Medications - No data to display  ED Course  I have reviewed the triage vital signs and the nursing notes.  Pertinent labs & imaging results that were available during my care of the patient were reviewed by me and considered in my medical decision making (see chart for details).    MDM Rules/Calculators/A&P  8-84 year old female with who fell from chair.  She struck her head.  Head CT here is shows no evidence of acute intracranial abnormality.  CT of cervical spine shows no evidence of fracture.  X-Rhianne Soman of the ankle, and clavicle reveal no evidence of acute fracture. 2  patient nausea and vomiting and decreased p.o. intake.  Here her labs reveal some mildly elevated LFTs with AST at 90 and ALT at 81.  Lipase is normal.  White blood cell count is normal.  Abdomen is soft and nontender.  She is having p.o. challenge here.  Plan continue outpatient work-up if patient tolerates p.o. fluids here. Patient tolerating fluids well at this time.  Discussed with patient and daughter that she will need ultrasound as outpatient.  They understand return if she is worse at any time.  She will advance her diet gradually. Final Clinical Impression(s) / ED Diagnoses Final diagnoses:  Fall, initial encounter  Contusion of scalp, initial encounter  Right ankle swelling  Nausea and vomiting, intractability of vomiting not specified, unspecified vomiting type    Rx / DC Orders ED Discharge Orders    None       Pattricia Boss, MD 11/12/19 [phone]

## 2019-11-13 DIAGNOSIS — R32 Unspecified urinary incontinence: Secondary | ICD-10-CM | POA: Diagnosis not present

## 2019-11-13 DIAGNOSIS — R488 Other symbolic dysfunctions: Secondary | ICD-10-CM | POA: Diagnosis not present

## 2019-11-13 DIAGNOSIS — I651 Occlusion and stenosis of basilar artery: Secondary | ICD-10-CM | POA: Diagnosis not present

## 2019-11-13 DIAGNOSIS — R296 Repeated falls: Secondary | ICD-10-CM | POA: Diagnosis not present

## 2019-11-13 DIAGNOSIS — R269 Unspecified abnormalities of gait and mobility: Secondary | ICD-10-CM | POA: Diagnosis not present

## 2019-11-13 DIAGNOSIS — R41841 Cognitive communication deficit: Secondary | ICD-10-CM | POA: Diagnosis not present

## 2019-11-13 DIAGNOSIS — R2681 Unsteadiness on feet: Secondary | ICD-10-CM | POA: Diagnosis not present

## 2019-11-13 DIAGNOSIS — R278 Other lack of coordination: Secondary | ICD-10-CM | POA: Diagnosis not present

## 2019-11-13 DIAGNOSIS — R1312 Dysphagia, oropharyngeal phase: Secondary | ICD-10-CM | POA: Diagnosis not present

## 2019-11-13 DIAGNOSIS — M6281 Muscle weakness (generalized): Secondary | ICD-10-CM | POA: Diagnosis not present

## 2019-11-14 DIAGNOSIS — R488 Other symbolic dysfunctions: Secondary | ICD-10-CM | POA: Diagnosis not present

## 2019-11-14 DIAGNOSIS — R109 Unspecified abdominal pain: Secondary | ICD-10-CM | POA: Diagnosis not present

## 2019-11-14 DIAGNOSIS — Z79899 Other long term (current) drug therapy: Secondary | ICD-10-CM | POA: Diagnosis not present

## 2019-11-14 DIAGNOSIS — K5909 Other constipation: Secondary | ICD-10-CM | POA: Diagnosis not present

## 2019-11-14 DIAGNOSIS — R112 Nausea with vomiting, unspecified: Secondary | ICD-10-CM | POA: Diagnosis not present

## 2019-11-14 DIAGNOSIS — M6281 Muscle weakness (generalized): Secondary | ICD-10-CM | POA: Diagnosis not present

## 2019-11-14 DIAGNOSIS — R296 Repeated falls: Secondary | ICD-10-CM | POA: Diagnosis not present

## 2019-11-14 DIAGNOSIS — R2681 Unsteadiness on feet: Secondary | ICD-10-CM | POA: Diagnosis not present

## 2019-11-14 DIAGNOSIS — R41841 Cognitive communication deficit: Secondary | ICD-10-CM | POA: Diagnosis not present

## 2019-11-14 DIAGNOSIS — R1312 Dysphagia, oropharyngeal phase: Secondary | ICD-10-CM | POA: Diagnosis not present

## 2019-11-14 DIAGNOSIS — R278 Other lack of coordination: Secondary | ICD-10-CM | POA: Diagnosis not present

## 2019-11-14 DIAGNOSIS — N390 Urinary tract infection, site not specified: Secondary | ICD-10-CM | POA: Diagnosis not present

## 2019-11-15 ENCOUNTER — Emergency Department (HOSPITAL_COMMUNITY)
Admission: EM | Admit: 2019-11-15 | Discharge: 2019-11-16 | Disposition: A | Discharge status: Home / Self Care | Payer: PPO | Attending: Emergency Medicine | Admitting: Emergency Medicine

## 2019-11-15 ENCOUNTER — Emergency Department (HOSPITAL_COMMUNITY): Payer: PPO

## 2019-11-15 DIAGNOSIS — M6281 Muscle weakness (generalized): Secondary | ICD-10-CM | POA: Diagnosis not present

## 2019-11-15 DIAGNOSIS — R112 Nausea with vomiting, unspecified: Secondary | ICD-10-CM | POA: Diagnosis not present

## 2019-11-15 DIAGNOSIS — R488 Other symbolic dysfunctions: Secondary | ICD-10-CM | POA: Diagnosis not present

## 2019-11-15 DIAGNOSIS — R1011 Right upper quadrant pain: Secondary | ICD-10-CM

## 2019-11-15 DIAGNOSIS — T68XXXS Hypothermia, sequela: Secondary | ICD-10-CM | POA: Diagnosis not present

## 2019-11-15 DIAGNOSIS — I10 Essential (primary) hypertension: Secondary | ICD-10-CM | POA: Insufficient documentation

## 2019-11-15 DIAGNOSIS — R41841 Cognitive communication deficit: Secondary | ICD-10-CM | POA: Diagnosis not present

## 2019-11-15 DIAGNOSIS — E039 Hypothyroidism, unspecified: Secondary | ICD-10-CM | POA: Diagnosis not present

## 2019-11-15 DIAGNOSIS — K5909 Other constipation: Secondary | ICD-10-CM | POA: Diagnosis not present

## 2019-11-15 DIAGNOSIS — R1312 Dysphagia, oropharyngeal phase: Secondary | ICD-10-CM | POA: Diagnosis not present

## 2019-11-15 DIAGNOSIS — R7401 Elevation of levels of liver transaminase levels: Secondary | ICD-10-CM | POA: Diagnosis not present

## 2019-11-15 DIAGNOSIS — K219 Gastro-esophageal reflux disease without esophagitis: Secondary | ICD-10-CM | POA: Diagnosis not present

## 2019-11-15 DIAGNOSIS — N281 Cyst of kidney, acquired: Secondary | ICD-10-CM | POA: Diagnosis not present

## 2019-11-15 DIAGNOSIS — K5900 Constipation, unspecified: Secondary | ICD-10-CM | POA: Diagnosis not present

## 2019-11-15 DIAGNOSIS — R296 Repeated falls: Secondary | ICD-10-CM | POA: Diagnosis not present

## 2019-11-15 DIAGNOSIS — R1111 Vomiting without nausea: Secondary | ICD-10-CM | POA: Diagnosis not present

## 2019-11-15 DIAGNOSIS — W19XXXA Unspecified fall, initial encounter: Secondary | ICD-10-CM | POA: Diagnosis not present

## 2019-11-15 DIAGNOSIS — E876 Hypokalemia: Secondary | ICD-10-CM | POA: Diagnosis not present

## 2019-11-15 DIAGNOSIS — R1010 Upper abdominal pain, unspecified: Secondary | ICD-10-CM | POA: Diagnosis not present

## 2019-11-15 DIAGNOSIS — R278 Other lack of coordination: Secondary | ICD-10-CM | POA: Diagnosis not present

## 2019-11-15 DIAGNOSIS — E8809 Other disorders of plasma-protein metabolism, not elsewhere classified: Secondary | ICD-10-CM | POA: Diagnosis not present

## 2019-11-15 DIAGNOSIS — R2681 Unsteadiness on feet: Secondary | ICD-10-CM | POA: Diagnosis not present

## 2019-11-15 DIAGNOSIS — K8020 Calculus of gallbladder without cholecystitis without obstruction: Secondary | ICD-10-CM | POA: Diagnosis not present

## 2019-11-15 DIAGNOSIS — R109 Unspecified abdominal pain: Secondary | ICD-10-CM | POA: Diagnosis not present

## 2019-11-15 DIAGNOSIS — Z950 Presence of cardiac pacemaker: Secondary | ICD-10-CM | POA: Diagnosis not present

## 2019-11-15 DIAGNOSIS — I878 Other specified disorders of veins: Secondary | ICD-10-CM | POA: Diagnosis not present

## 2019-11-15 LAB — URINALYSIS, ROUTINE W REFLEX MICROSCOPIC
Bilirubin Urine: NEGATIVE
Glucose, UA: NEGATIVE mg/dL
Ketones, ur: NEGATIVE mg/dL
Leukocytes,Ua: NEGATIVE
Nitrite: NEGATIVE
Protein, ur: 100 mg/dL — AB
Specific Gravity, Urine: 1.017 (ref 1.005–1.030)
pH: 7 (ref 5.0–8.0)

## 2019-11-15 LAB — COMPREHENSIVE METABOLIC PANEL
ALT: 315 U/L — ABNORMAL HIGH (ref 0–44)
AST: 336 U/L — ABNORMAL HIGH (ref 15–41)
Albumin: 3.3 g/dL — ABNORMAL LOW (ref 3.5–5.0)
Alkaline Phosphatase: 76 U/L (ref 38–126)
Anion gap: 12 (ref 5–15)
BUN: 13 mg/dL (ref 8–23)
CO2: 26 mmol/L (ref 22–32)
Calcium: 9.2 mg/dL (ref 8.9–10.3)
Chloride: 101 mmol/L (ref 98–111)
Creatinine, Ser: 1.19 mg/dL — ABNORMAL HIGH (ref 0.44–1.00)
GFR calc Af Amer: 46 mL/min — ABNORMAL LOW (ref >60)
GFR calc non Af Amer: 40 mL/min — ABNORMAL LOW (ref >60)
Glucose, Bld: 105 mg/dL — ABNORMAL HIGH (ref 70–99)
Potassium: 3.5 mmol/L (ref 3.5–5.1)
Sodium: 139 mmol/L (ref 135–145)
Total Bilirubin: 0.8 mg/dL (ref 0.3–1.2)
Total Protein: 7 g/dL (ref 6.5–8.1)

## 2019-11-15 LAB — CBC WITH DIFFERENTIAL/PLATELET
Abs Immature Granulocytes: 0.03 10*3/uL (ref 0.00–0.07)
Basophils Absolute: 0 10*3/uL (ref 0.0–0.1)
Basophils Relative: 0 %
Eosinophils Absolute: 0 10*3/uL (ref 0.0–0.5)
Eosinophils Relative: 0 %
HCT: 34.8 % — ABNORMAL LOW (ref 36.0–46.0)
Hemoglobin: 11.4 g/dL — ABNORMAL LOW (ref 12.0–15.0)
Immature Granulocytes: 0 %
Lymphocytes Relative: 23 %
Lymphs Abs: 1.6 10*3/uL (ref 0.7–4.0)
MCH: 29.4 pg (ref 26.0–34.0)
MCHC: 32.8 g/dL (ref 30.0–36.0)
MCV: 89.7 fL (ref 80.0–100.0)
Monocytes Absolute: 0.8 10*3/uL (ref 0.1–1.0)
Monocytes Relative: 12 %
Neutro Abs: 4.4 10*3/uL (ref 1.7–7.7)
Neutrophils Relative %: 65 %
Platelets: 185 10*3/uL (ref 150–400)
RBC: 3.88 MIL/uL (ref 3.87–5.11)
RDW: 15.3 % (ref 11.5–15.5)
WBC: 6.9 10*3/uL (ref 4.0–10.5)
nRBC: 0 % (ref 0.0–0.2)

## 2019-11-15 MED ORDER — ONDANSETRON HCL 4 MG/2ML IJ SOLN
4.0000 mg | Freq: Once | INTRAMUSCULAR | Status: AC
  Administered 2019-11-15: 4 mg via INTRAVENOUS
  Filled 2019-11-15: qty 2

## 2019-11-15 MED ORDER — FLEET ENEMA 7-19 GM/118ML RE ENEM
1.0000 | ENEMA | Freq: Once | RECTAL | Status: AC
  Administered 2019-11-15: 1 via RECTAL
  Filled 2019-11-15: qty 1

## 2019-11-15 MED ORDER — SODIUM CHLORIDE 0.9 % IV BOLUS
1000.0000 mL | Freq: Once | INTRAVENOUS | Status: AC
  Administered 2019-11-15: 1000 mL via INTRAVENOUS

## 2019-11-15 MED ORDER — LACTULOSE 10 GM/15ML PO SOLN
20.0000 g | Freq: Once | ORAL | Status: AC
  Administered 2019-11-15: 20 g via ORAL
  Filled 2019-11-15: qty 30

## 2019-11-15 MED ORDER — MILK AND MOLASSES ENEMA
1.0000 | Freq: Once | RECTAL | Status: DC
  Filled 2019-11-15: qty 240

## 2019-11-15 MED ORDER — GLYCERIN (ADULT) 2 G RE SUPP
1.0000 | RECTAL | 0 refills | Status: AC | PRN
Start: 2019-11-15 — End: ?

## 2019-11-15 NOTE — ED Notes (Signed)
Pt dry heaving after drinking lactulose.

## 2019-11-15 NOTE — Discharge Instructions (Signed)
Please follow up with your doctor to follow up with outpatient GI doctor.  I recommend you continue to use suppositories daily or if tolerated Miralax daily.

## 2019-11-15 NOTE — ED Notes (Signed)
Pt had moderate sized green BM.

## 2019-11-15 NOTE — ED Notes (Signed)
D/C papers, follow-up and prescriptions discussed w/ pt and pt's daughter. Both verbalized understanding.

## 2019-11-15 NOTE — ED Notes (Signed)
Pt unable to hold enema in and tolerate it. Provider notified.

## 2019-11-15 NOTE — ED Provider Notes (Signed)
[facility] EMERGENCY DEPARTMENT Provider Note   CSN: [id number] Arrival date & time: 11/15/19  1511     History Chief Complaint  Patient presents with  . Constipation  . Emesis    Jade Juarez is a 86 y.o. female.  Patient reports constipation since Saturday. LBM that day, small and hard.  Denies any bloody stool.  Reports taking Miralax every 6-7 days.  Endorses abdominal paint that she attributes to needing to move bowels.  Denies any dysuria, frequency or urgency.  Recently started on Bactrim for UTI, has one day left of treatment. She reports emesis since since Sunday and has not been able to keep anything down for 3 days. She reports having dry heaves and vomiting mucus.  Reports NBNB emesis.  The history is provided by the patient.       Past Medical History:  Diagnosis Date  . Anemia    takes Ferrous Sulfate daily  . Anxiety   . Arthritis    right knees  . Atrial fibrillation ([place])    prescribed Eliquis but hasn't started-waiting until after this procedure per [place]  . Depression    takes Zoloft daily  . Diverticulitis    hx of  . Frequent UTI   . GERD (gastroesophageal reflux disease)    hx of-yrs ago  . Headache    occasionally  . Heart murmur   . High cholesterol    takes Atorvastatin daily  . History of bronchitis    about a yr ago  . History of colon polyps   . Hypertension    takes Diltiazem and Diovan daily  . Hypothyroidism    takes Synthroid daily  . IBS (irritable bowel syndrome)    PMH  . Incontinence   . Joint pain   . Joint swelling   . Nocturia   . Overactive bladder   . PONV (postoperative nausea and vomiting)   . Presence of permanent cardiac pacemaker   . Restless leg syndrome   . Status post placement of implantable loop recorder 05/2014  . Stool incontinence    at times  . Stroke [facility])    takes Plavix daily-right sided weakness  . Vertebrobasilar artery insufficiency   . Wears glasses     Patient  Active Problem List   Diagnosis Date Noted  . UTI (urinary tract infection) 07/30/2019  . Basilar artery stenosis 11/14/2014  . Occlusion and stenosis of basilar artery     Past Surgical History:  Procedure Laterality Date  . ABDOMINAL HYSTERECTOMY    . bladder tack     x 2  . BREAST SURGERY Right    cyst removed  . COLONOSCOPY    . EYE SURGERY Bilateral    cataract surgery  . INSERT / REPLACE / REMOVE PACEMAKER    . LOOP RECORDER IMPLANT  05/23/2014   [employer] Reveal loop recorder (Dr. Lisa Roca, [facility])  . RADIOLOGY WITH ANESTHESIA N/A 08/15/2014   Procedure: RADIOLOGY WITH ANESTHESIA;  Surgeon: Luanne Bras, MD;  Location: [facility];  Service: Radiology;  Laterality: N/A;  . RADIOLOGY WITH ANESTHESIA N/A 11/05/2014   Procedure: ANGIOPLASTY WITH POSSIBLE STENTING      (RADIOLOGY WITH ANESTHESIA );  Surgeon: Luanne Bras, MD;  Location: [place];  Service: Radiology;  Laterality: N/A;  . RADIOLOGY WITH ANESTHESIA N/A 11/14/2014   Procedure: RADIOLOGY WITH ANESTHESIA;  Surgeon: Luanne Bras, MD;  Location: [place];  Service: Radiology;  Laterality: N/A;     OB History  No obstetric history on file.     Family History  Problem Relation Age of Onset  . Hypertension Mother   . Heart attack Mother   . Cancer Father     Social History   Tobacco Use  . Smoking status: Never Smoker  . Smokeless tobacco: Never Used  Substance Use Topics  . Alcohol use: No  . Drug use: No    Home Medications Prior to Admission medications   Medication Sig Start Date End Date Taking? Authorizing Provider  amoxicillin (AMOXIL) 500 MG capsule Take 2,000 mg by mouth See admin instructions. 1 hour before dental procedure 06/17/14   [provider]  apixaban (ELIQUIS) 2.5 MG TABS tablet Take 2.5 mg by mouth 2 (two) times daily.    [provider]  atorvastatin (LIPITOR) 10 MG tablet Take 10 mg by mouth daily.    [provider]  calcium carbonate (OS-CAL) 600 MG  TABS tablet Take 600 mg by mouth daily. 2 tabs    [provider]  Cholecalciferol (VITAMIN D3) 5000 UNITS CAPS Take 5,000 Units by mouth daily.    [provider]  conjugated estrogens (PREMARIN) vaginal cream Place 1 Applicatorful vaginally every other day.    [provider]  diclofenac sodium (VOLTAREN) 1 % GEL Apply 2 g topically 2 (two) times daily.    [provider]  diltiazem (CARDIZEM CD) 120 MG 24 hr capsule Take 120 mg by mouth 2 (two) times daily.     [provider]  ferrous sulfate 325 (65 FE) MG tablet Take 325 mg by mouth 2 (two) times a week. Takes on Monday & Wedensday    [provider]  gabapentin (NEURONTIN) 400 MG capsule Take 400 mg by mouth at bedtime.     [provider]  glucosamine-chondroitin 500-400 MG tablet Take 1 tablet by mouth daily.    [provider]  glycerin adult 2 g suppository Place 1 suppository rectally as needed for constipation. 11/15/19   Carollee Leitz, MD  levothyroxine (SYNTHROID, LEVOTHROID) 88 MCG tablet Take 88 mcg by mouth daily before breakfast.    [provider]  montelukast (SINGULAIR) 10 MG tablet Take 10 mg by mouth at bedtime.    [provider]  Multiple Vitamin (MULTIVITAMIN) tablet Take 1 tablet by mouth daily.    [provider]  ondansetron (ZOFRAN ODT) 4 MG disintegrating tablet Take 1 tablet (4 mg total) by mouth every 8 (eight) hours as needed for nausea or vomiting. 08/04/19   Gareth Morgan, MD  potassium chloride SA (KLOR-CON) 20 MEQ tablet Take 1 tablet (20 mEq total) by mouth 2 (two) times daily for 3 days. 07/31/19 08/03/19  Maudie Flakes, MD  sertraline (ZOLOFT) 50 MG tablet Take 50 mg by mouth daily.    [provider]  ticagrelor (BRILINTA) 90 MG TABS tablet Take 45-90 mg by mouth See admin instructions. 11/06/14-11/11/14=90 mg twice a day 11/12/14=90 mg daily 11/13/14=45 mg twice a day 11/14/14=45 mg in the morning...  Unknowing dosing thereafter    [provider]  valsartan-hydrochlorothiazide (DIOVAN-HCT) 160-12.5 MG per tablet Take 1 tablet by mouth daily.    [provider]    Allergies    Hibiclens [chlorhexidine gluconate], Benicar [olmesartan], Codeine, Erythromycin, Lipitor [atorvastatin], Other, Tetracyclines & related, Vibramycin [doxycycline], and Vicodin [hydrocodone-acetaminophen]  Review of Systems   Review of Systems  Constitutional: Negative for fever.  HENT: Negative for trouble swallowing.   Cardiovascular: Negative for chest pain.  Gastrointestinal: Positive  for abdominal pain, constipation, nausea and vomiting. Negative for abdominal distention, anal bleeding, blood in stool and diarrhea.  Genitourinary: Negative for difficulty urinating, dysuria, hematuria and urgency.    Physical Exam Updated Vital Signs BP (!) 116/94   Pulse 60   Temp 98.5 F (36.9 C) (Oral)   Resp 18   SpO2 97%   Physical Exam HENT:     Head: Normocephalic.     Nose: Nose normal.     Mouth/Throat:     Mouth: Mucous membranes are moist.  Eyes:     Extraocular Movements: Extraocular movements intact.     Pupils: Pupils are equal, round, and reactive to light.  Cardiovascular:     Rate and Rhythm: Normal rate.     Pulses: Normal pulses.     Heart sounds: Normal heart sounds.  Pulmonary:     Effort: Pulmonary effort is normal.     Breath sounds: Normal breath sounds.  Abdominal:     General: Abdomen is flat.     Palpations: Abdomen is soft.     Tenderness: There is abdominal tenderness. There is no rebound.     Comments: Hypoactive BS  Genitourinary:    Rectum: Normal.  Musculoskeletal:     Cervical back: Normal range of motion.  Skin:    General: Skin is warm and dry.  Neurological:     General: No focal deficit present.     Mental Status: She is oriented to person, place, and time.  Psychiatric:        Mood and Affect: Mood normal.     ED Results / Procedures /  Treatments   Labs (all labs ordered are listed, but only abnormal results are displayed) Labs Reviewed  CBC WITH DIFFERENTIAL/PLATELET - Abnormal; Notable for the following components:      Result Value   Hemoglobin 11.4 (*)    HCT 34.8 (*)    All other components within normal limits  COMPREHENSIVE METABOLIC PANEL - Abnormal; Notable for the following components:   Glucose, Bld 105 (*)    Creatinine, Ser 1.19 (*)    Albumin 3.3 (*)    AST 336 (*)    ALT 315 (*)    GFR calc non Af Amer 40 (*)    GFR calc Af Amer 46 (*)    All other components within normal limits  URINALYSIS, ROUTINE W REFLEX MICROSCOPIC - Abnormal; Notable for the following components:   APPearance HAZY (*)    Hgb urine dipstick SMALL (*)    Protein, ur 100 (*)    Bacteria, UA FEW (*)    All other components within normal limits    EKG None  Radiology DG ABD ACUTE 2+V W 1V CHEST  Result Date: 11/15/2019 CLINICAL DATA:  Abdominal pain. EXAM: DG ABDOMEN ACUTE W/ 1V CHEST COMPARISON:  None. FINDINGS: There is a large amount of stool throughout the patient's colon. The bowel gas pattern is nonobstructive. A dual chamber pacemaker is noted projecting over the cardiac shadow. There are no definite radiopaque kidney stones. Multiple calcifications project over the right hemi sacrum. These are favored to represent phleboliths. IMPRESSION: 1. Nonobstructive bowel gas pattern. 2. Large amount of stool throughout the colon. 3. No definite radiopaque kidney stones. Electronically Signed   By: Constance Holster M.D.   On: 11/15/2019 16:49   US Abdomen Limited RUQ  Result Date: 11/15/2019 CLINICAL DATA:  Right upper quadrant pain EXAM: ULTRASOUND ABDOMEN LIMITED RIGHT UPPER QUADRANT COMPARISON:  CT 08/04/2019 FINDINGS: Gallbladder:  Multiple stones fill the gallbladder. Negative sonographic Murphy's. Common bile duct: Diameter: Normal caliber, 5 mm Liver: Increased echotexture compatible with fatty infiltration. No focal  abnormality or biliary ductal dilatation. Portal vein is patent on color Doppler imaging with normal direction of blood flow towards the liver. Other: Right kidney appears echogenic.  No hydronephrosis. IMPRESSION: Cholelithiasis.  No sonographic evidence of acute cholecystitis. Fatty infiltration of the liver. Echogenic right kidney suggests chronic medical renal disease. Electronically Signed   By: Rolm Baptise M.D.   On: 11/15/2019 19:04    Procedures Procedures (including critical care time)  Medications Ordered in ED Medications  sodium chloride 0.9 % bolus 1,000 mL (0 mLs Intravenous Stopped 11/15/19 1908)  sodium phosphate (FLEET) 7-19 GM/118ML enema 1 enema (1 enema Rectal Given 11/15/19 1908)  ondansetron [facility]) injection 4 mg (4 mg Intravenous Given 11/15/19 1736)  lactulose (CHRONULAC) 10 GM/15ML solution 20 g (20 g Oral Given 11/15/19 1908)    ED Course  I have reviewed the triage vital signs and the nursing notes.  Pertinent labs & imaging results that were available during my care of the patient were reviewed by me and considered in my medical decision making (see chart for details).    MDM Rules/Calculators/A&P                          Sundeep Cary is a 86 y.o. female who presented to the ED with abdominal pain and dry heaves.  DRE wnl. Abdominal xray shows nonobstructive bowel with large amount stool In the colon. Labs significant for elevated AST/ALT.  Could be secondary to consistent dry heaves, also considered cholecystitis, hepatitis but given no RUQ pain seems less likely.  Abdominal ultrasound shows cholelithiasis but not acute cholecystis.   Will give IV fluids, Zofran and start Lactulose and fleet enema to help with BM.    Patient was able to have medium BM after fleet enema.  Tolerated po fluids and crackers.  Patient will be discharged back to home with plan to start Glycerine suppository daily and follow up with PCP for follow up with recent GI referral.    Final  Clinical Impression(s) / ED Diagnoses Final diagnoses:  RUQ pain  Constipation, unspecified constipation type    Rx / DC Orders ED Discharge Orders         Ordered    glycerin adult 2 g suppository  As needed     Discontinue  Reprint     11/15/19 2207           Carollee Leitz, MD 11/15/19 2225    Drenda Freeze, MD 11/16/19 [phone]

## 2019-11-15 NOTE — ED Notes (Signed)
Patient transported to Ultrasound 

## 2019-11-15 NOTE — ED Triage Notes (Signed)
Pt arrived to ED via [place] from [facility] w/ c/o constipation since Saturday and nausea/vomiting. Pt was seen here Sunday for same and given miralax, but pt is unable to keep miralax done. Pt not in any distress at this time and is A&Ox4.

## 2019-11-15 NOTE — ED Notes (Signed)
Ptar called by dee pt is number 10 on the list

## 2019-11-16 DIAGNOSIS — R1312 Dysphagia, oropharyngeal phase: Secondary | ICD-10-CM | POA: Diagnosis not present

## 2019-11-16 DIAGNOSIS — N183 Chronic kidney disease, stage 3 unspecified: Secondary | ICD-10-CM | POA: Diagnosis not present

## 2019-11-16 DIAGNOSIS — K5900 Constipation, unspecified: Secondary | ICD-10-CM | POA: Diagnosis not present

## 2019-11-16 DIAGNOSIS — Z7401 Bed confinement status: Secondary | ICD-10-CM | POA: Diagnosis not present

## 2019-11-16 DIAGNOSIS — R5381 Other malaise: Secondary | ICD-10-CM | POA: Diagnosis not present

## 2019-11-16 DIAGNOSIS — R278 Other lack of coordination: Secondary | ICD-10-CM | POA: Diagnosis not present

## 2019-11-16 DIAGNOSIS — Z79899 Other long term (current) drug therapy: Secondary | ICD-10-CM | POA: Diagnosis not present

## 2019-11-16 DIAGNOSIS — R296 Repeated falls: Secondary | ICD-10-CM | POA: Diagnosis not present

## 2019-11-16 DIAGNOSIS — R41841 Cognitive communication deficit: Secondary | ICD-10-CM | POA: Diagnosis not present

## 2019-11-16 DIAGNOSIS — M6281 Muscle weakness (generalized): Secondary | ICD-10-CM | POA: Diagnosis not present

## 2019-11-16 DIAGNOSIS — D631 Anemia in chronic kidney disease: Secondary | ICD-10-CM | POA: Diagnosis not present

## 2019-11-16 DIAGNOSIS — R488 Other symbolic dysfunctions: Secondary | ICD-10-CM | POA: Diagnosis not present

## 2019-11-16 DIAGNOSIS — M2550 Pain in unspecified joint: Secondary | ICD-10-CM | POA: Diagnosis not present

## 2019-11-16 DIAGNOSIS — R2681 Unsteadiness on feet: Secondary | ICD-10-CM | POA: Diagnosis not present

## 2019-11-16 NOTE — ED Notes (Signed)
Larene Beach from [place] has arrived for pt , pt in NAD at this time ,all paper work given to [employer]

## 2019-11-16 NOTE — ED Notes (Addendum)
RN  Assumed care of pt , pt in NAD at this time , pt denies ant needs at this time , call light in hand, lights dimmed for comfort. Pt awaiting PTAR to be taken back to facility , paperwork at bedside.

## 2019-11-17 ENCOUNTER — Encounter: Payer: Self-pay | Admitting: Gastroenterology

## 2019-11-17 DIAGNOSIS — R278 Other lack of coordination: Secondary | ICD-10-CM | POA: Diagnosis not present

## 2019-11-17 DIAGNOSIS — R2681 Unsteadiness on feet: Secondary | ICD-10-CM | POA: Diagnosis not present

## 2019-11-17 DIAGNOSIS — R41841 Cognitive communication deficit: Secondary | ICD-10-CM | POA: Diagnosis not present

## 2019-11-17 DIAGNOSIS — R488 Other symbolic dysfunctions: Secondary | ICD-10-CM | POA: Diagnosis not present

## 2019-11-17 DIAGNOSIS — M6281 Muscle weakness (generalized): Secondary | ICD-10-CM | POA: Diagnosis not present

## 2019-11-17 DIAGNOSIS — R296 Repeated falls: Secondary | ICD-10-CM | POA: Diagnosis not present

## 2019-11-17 DIAGNOSIS — R1312 Dysphagia, oropharyngeal phase: Secondary | ICD-10-CM | POA: Diagnosis not present

## 2019-11-18 DIAGNOSIS — R7401 Elevation of levels of liver transaminase levels: Secondary | ICD-10-CM | POA: Diagnosis not present

## 2019-11-18 DIAGNOSIS — R296 Repeated falls: Secondary | ICD-10-CM | POA: Diagnosis not present

## 2019-11-18 DIAGNOSIS — R112 Nausea with vomiting, unspecified: Secondary | ICD-10-CM | POA: Diagnosis not present

## 2019-11-18 DIAGNOSIS — E876 Hypokalemia: Secondary | ICD-10-CM | POA: Diagnosis not present

## 2019-11-20 DIAGNOSIS — R488 Other symbolic dysfunctions: Secondary | ICD-10-CM | POA: Diagnosis not present

## 2019-11-20 DIAGNOSIS — R296 Repeated falls: Secondary | ICD-10-CM | POA: Diagnosis not present

## 2019-11-20 DIAGNOSIS — R2681 Unsteadiness on feet: Secondary | ICD-10-CM | POA: Diagnosis not present

## 2019-11-20 DIAGNOSIS — R41841 Cognitive communication deficit: Secondary | ICD-10-CM | POA: Diagnosis not present

## 2019-11-20 DIAGNOSIS — R1312 Dysphagia, oropharyngeal phase: Secondary | ICD-10-CM | POA: Diagnosis not present

## 2019-11-20 DIAGNOSIS — R278 Other lack of coordination: Secondary | ICD-10-CM | POA: Diagnosis not present

## 2019-11-20 DIAGNOSIS — M6281 Muscle weakness (generalized): Secondary | ICD-10-CM | POA: Diagnosis not present

## 2019-11-21 DIAGNOSIS — R278 Other lack of coordination: Secondary | ICD-10-CM | POA: Diagnosis not present

## 2019-11-21 DIAGNOSIS — M6281 Muscle weakness (generalized): Secondary | ICD-10-CM | POA: Diagnosis not present

## 2019-11-21 DIAGNOSIS — R296 Repeated falls: Secondary | ICD-10-CM | POA: Diagnosis not present

## 2019-11-21 DIAGNOSIS — R41841 Cognitive communication deficit: Secondary | ICD-10-CM | POA: Diagnosis not present

## 2019-11-21 DIAGNOSIS — R1312 Dysphagia, oropharyngeal phase: Secondary | ICD-10-CM | POA: Diagnosis not present

## 2019-11-21 DIAGNOSIS — R488 Other symbolic dysfunctions: Secondary | ICD-10-CM | POA: Diagnosis not present

## 2019-11-21 DIAGNOSIS — R2681 Unsteadiness on feet: Secondary | ICD-10-CM | POA: Diagnosis not present

## 2019-11-22 ENCOUNTER — Encounter (HOSPITAL_COMMUNITY): Payer: Self-pay | Admitting: Emergency Medicine

## 2019-11-22 ENCOUNTER — Emergency Department (HOSPITAL_COMMUNITY): Payer: PPO

## 2019-11-22 ENCOUNTER — Emergency Department (HOSPITAL_COMMUNITY)
Admission: EM | Admit: 2019-11-22 | Discharge: 2019-11-22 | Disposition: A | Discharge status: Skilled Nursing Facility | Payer: PPO | Attending: Emergency Medicine | Admitting: Emergency Medicine

## 2019-11-22 ENCOUNTER — Other Ambulatory Visit: Payer: Self-pay

## 2019-11-22 DIAGNOSIS — K8020 Calculus of gallbladder without cholecystitis without obstruction: Secondary | ICD-10-CM

## 2019-11-22 DIAGNOSIS — R2681 Unsteadiness on feet: Secondary | ICD-10-CM | POA: Diagnosis not present

## 2019-11-22 DIAGNOSIS — R29898 Other symptoms and signs involving the musculoskeletal system: Secondary | ICD-10-CM | POA: Diagnosis not present

## 2019-11-22 DIAGNOSIS — Z79899 Other long term (current) drug therapy: Secondary | ICD-10-CM | POA: Diagnosis not present

## 2019-11-22 DIAGNOSIS — M6281 Muscle weakness (generalized): Secondary | ICD-10-CM | POA: Diagnosis not present

## 2019-11-22 DIAGNOSIS — R7401 Elevation of levels of liver transaminase levels: Secondary | ICD-10-CM | POA: Diagnosis not present

## 2019-11-22 DIAGNOSIS — K838 Other specified diseases of biliary tract: Secondary | ICD-10-CM | POA: Diagnosis not present

## 2019-11-22 DIAGNOSIS — R1312 Dysphagia, oropharyngeal phase: Secondary | ICD-10-CM | POA: Diagnosis not present

## 2019-11-22 DIAGNOSIS — R790 Abnormal level of blood mineral: Secondary | ICD-10-CM | POA: Diagnosis not present

## 2019-11-22 DIAGNOSIS — I10 Essential (primary) hypertension: Secondary | ICD-10-CM | POA: Insufficient documentation

## 2019-11-22 DIAGNOSIS — R112 Nausea with vomiting, unspecified: Secondary | ICD-10-CM | POA: Diagnosis not present

## 2019-11-22 DIAGNOSIS — R7989 Other specified abnormal findings of blood chemistry: Secondary | ICD-10-CM

## 2019-11-22 DIAGNOSIS — I700 Atherosclerosis of aorta: Secondary | ICD-10-CM | POA: Diagnosis not present

## 2019-11-22 DIAGNOSIS — R1111 Vomiting without nausea: Secondary | ICD-10-CM | POA: Diagnosis not present

## 2019-11-22 DIAGNOSIS — E876 Hypokalemia: Secondary | ICD-10-CM | POA: Diagnosis not present

## 2019-11-22 DIAGNOSIS — R1032 Left lower quadrant pain: Secondary | ICD-10-CM | POA: Insufficient documentation

## 2019-11-22 DIAGNOSIS — E860 Dehydration: Secondary | ICD-10-CM | POA: Insufficient documentation

## 2019-11-22 DIAGNOSIS — R778 Other specified abnormalities of plasma proteins: Secondary | ICD-10-CM

## 2019-11-22 DIAGNOSIS — K7689 Other specified diseases of liver: Secondary | ICD-10-CM | POA: Diagnosis not present

## 2019-11-22 DIAGNOSIS — R110 Nausea: Secondary | ICD-10-CM | POA: Insufficient documentation

## 2019-11-22 DIAGNOSIS — R140 Abdominal distension (gaseous): Secondary | ICD-10-CM | POA: Insufficient documentation

## 2019-11-22 DIAGNOSIS — R488 Other symbolic dysfunctions: Secondary | ICD-10-CM | POA: Diagnosis not present

## 2019-11-22 DIAGNOSIS — R41841 Cognitive communication deficit: Secondary | ICD-10-CM | POA: Diagnosis not present

## 2019-11-22 DIAGNOSIS — R278 Other lack of coordination: Secondary | ICD-10-CM | POA: Diagnosis not present

## 2019-11-22 DIAGNOSIS — R945 Abnormal results of liver function studies: Secondary | ICD-10-CM | POA: Diagnosis not present

## 2019-11-22 DIAGNOSIS — R1110 Vomiting, unspecified: Secondary | ICD-10-CM

## 2019-11-22 DIAGNOSIS — R0902 Hypoxemia: Secondary | ICD-10-CM | POA: Diagnosis not present

## 2019-11-22 DIAGNOSIS — K5909 Other constipation: Secondary | ICD-10-CM | POA: Diagnosis not present

## 2019-11-22 DIAGNOSIS — R296 Repeated falls: Secondary | ICD-10-CM | POA: Diagnosis not present

## 2019-11-22 LAB — CBC WITH DIFFERENTIAL/PLATELET
Abs Immature Granulocytes: 0.02 10*3/uL (ref 0.00–0.07)
Basophils Absolute: 0 10*3/uL (ref 0.0–0.1)
Basophils Relative: 1 %
Eosinophils Absolute: 0.1 10*3/uL (ref 0.0–0.5)
Eosinophils Relative: 2 %
HCT: 34.3 % — ABNORMAL LOW (ref 36.0–46.0)
Hemoglobin: 11.7 g/dL — ABNORMAL LOW (ref 12.0–15.0)
Immature Granulocytes: 0 %
Lymphocytes Relative: 32 %
Lymphs Abs: 1.9 10*3/uL (ref 0.7–4.0)
MCH: 30.8 pg (ref 26.0–34.0)
MCHC: 34.1 g/dL (ref 30.0–36.0)
MCV: 90.3 fL (ref 80.0–100.0)
Monocytes Absolute: 0.7 10*3/uL (ref 0.1–1.0)
Monocytes Relative: 11 %
Neutro Abs: 3.4 10*3/uL (ref 1.7–7.7)
Neutrophils Relative %: 54 %
Platelets: 225 10*3/uL (ref 150–400)
RBC: 3.8 MIL/uL — ABNORMAL LOW (ref 3.87–5.11)
RDW: 15.9 % — ABNORMAL HIGH (ref 11.5–15.5)
WBC: 6.1 10*3/uL (ref 4.0–10.5)
nRBC: 0 % (ref 0.0–0.2)

## 2019-11-22 LAB — LIPASE, BLOOD: Lipase: 21 U/L (ref 11–51)

## 2019-11-22 LAB — COMPREHENSIVE METABOLIC PANEL
ALT: 158 U/L — ABNORMAL HIGH (ref 0–44)
AST: 137 U/L — ABNORMAL HIGH (ref 15–41)
Albumin: 3.5 g/dL (ref 3.5–5.0)
Alkaline Phosphatase: 81 U/L (ref 38–126)
Anion gap: 9 (ref 5–15)
BUN: 13 mg/dL (ref 8–23)
CO2: 25 mmol/L (ref 22–32)
Calcium: 9.1 mg/dL (ref 8.9–10.3)
Chloride: 102 mmol/L (ref 98–111)
Creatinine, Ser: 1.36 mg/dL — ABNORMAL HIGH (ref 0.44–1.00)
GFR calc Af Amer: 39 mL/min — ABNORMAL LOW (ref >60)
GFR calc non Af Amer: 34 mL/min — ABNORMAL LOW (ref >60)
Glucose, Bld: 101 mg/dL — ABNORMAL HIGH (ref 70–99)
Potassium: 4.6 mmol/L (ref 3.5–5.1)
Sodium: 136 mmol/L (ref 135–145)
Total Bilirubin: 0.9 mg/dL (ref 0.3–1.2)
Total Protein: 7.2 g/dL (ref 6.5–8.1)

## 2019-11-22 LAB — I-STAT CHEM 8, ED
BUN: 17 mg/dL (ref 8–23)
Calcium, Ion: 1.16 mmol/L (ref 1.15–1.40)
Chloride: 102 mmol/L (ref 98–111)
Creatinine, Ser: 1.4 mg/dL — ABNORMAL HIGH (ref 0.44–1.00)
Glucose, Bld: 98 mg/dL (ref 70–99)
HCT: 37 % (ref 36.0–46.0)
Hemoglobin: 12.6 g/dL (ref 12.0–15.0)
Potassium: 5.3 mmol/L — ABNORMAL HIGH (ref 3.5–5.1)
Sodium: 136 mmol/L (ref 135–145)
TCO2: 28 mmol/L (ref 22–32)

## 2019-11-22 LAB — MAGNESIUM: Magnesium: 2.6 mg/dL — ABNORMAL HIGH (ref 1.7–2.4)

## 2019-11-22 LAB — TROPONIN I (HIGH SENSITIVITY)
Troponin I (High Sensitivity): 84 ng/L — ABNORMAL HIGH (ref <18)
Troponin I (High Sensitivity): 89 ng/L — ABNORMAL HIGH (ref <18)

## 2019-11-22 MED ORDER — PROMETHAZINE HCL 12.5 MG RE SUPP: 12.5000 mg | Freq: Four times a day (QID) | RECTAL | 0 refills | Status: AC | PRN

## 2019-11-22 MED ORDER — ONDANSETRON HCL 4 MG/2ML IJ SOLN
4.0000 mg | Freq: Once | INTRAMUSCULAR | Status: AC
  Administered 2019-11-22: 4 mg via INTRAVENOUS
  Filled 2019-11-22: qty 2

## 2019-11-22 MED ORDER — IOHEXOL 300 MG/ML  SOLN
75.0000 mL | Freq: Once | INTRAMUSCULAR | Status: AC | PRN
  Administered 2019-11-22: 75 mL via INTRAVENOUS

## 2019-11-22 MED ORDER — SODIUM CHLORIDE 0.9 % IV BOLUS
500.0000 mL | Freq: Once | INTRAVENOUS | Status: AC
  Administered 2019-11-22: 500 mL via INTRAVENOUS

## 2019-11-22 NOTE — ED Notes (Signed)
PTAR called  

## 2019-11-22 NOTE — ED Triage Notes (Signed)
Patient presents from [facility] with complaints of nausea, vomiting and loss of appetite for the past 2 weeks. She also complains of constipation and has taken Miralax. She has been in the hospital 2 times recently with elevated liver enzymes. Patient is A&O x4   EMS vitals: 121/80 BP 70 HR 98% SPO2 on room air 18 Resp Rate 111 CBG

## 2019-11-22 NOTE — ED Provider Notes (Signed)
[facility] DEPT Provider Note   CSN: [id number] Arrival date & time: 11/22/19  1539     History Chief Complaint  Patient presents with  . Nausea  . Emesis  . Constipation    Jade Juarez is a 86 y.o. female hx of afib, depression, diverticulitis, HTN, here presenting with nausea vomiting.  Patient was seen in the ED for constipation a week ago.  Patient has been continue to take MiraLAX.  Patient occasionally has abdominal cramps.  She states that it is after she eats.  She has normal bowel movements now.  She has no vomiting.  She has minimally elevated LFTs during last ED visit but her ultrasound was unremarkable.  Patient denies any fevers or chills.  Denies any chest pain or urinary symptoms.  Patient had multiple negative Covid test recently.  Patient apparently had labs drawn at the facility today and LFTs are more elevated so patient was sent in for further evaluation.  The history is provided by the patient.       Past Medical History:  Diagnosis Date  . Anemia    takes Ferrous Sulfate daily  . Anxiety   . Arthritis    right knees  . Atrial fibrillation ([facility])    prescribed Eliquis but hasn't started-waiting until after this procedure per [place]  . Depression    takes Zoloft daily  . Diverticulitis    hx of  . Frequent UTI   . GERD (gastroesophageal reflux disease)    hx of-yrs ago  . Headache    occasionally  . Heart murmur   . High cholesterol    takes Atorvastatin daily  . History of bronchitis    about a yr ago  . History of colon polyps   . Hypertension    takes Diltiazem and Diovan daily  . Hypothyroidism    takes Synthroid daily  . IBS (irritable bowel syndrome)    PMH  . Incontinence   . Joint pain   . Joint swelling   . Nocturia   . Overactive bladder   . PONV (postoperative nausea and vomiting)   . Presence of permanent cardiac pacemaker   . Restless leg syndrome   . Status post placement of implantable  loop recorder 05/2014  . Stool incontinence    at times  . Stroke [facility])    takes Plavix daily-right sided weakness  . Vertebrobasilar artery insufficiency   . Wears glasses     Patient Active Problem List   Diagnosis Date Noted  . UTI (urinary tract infection) 07/30/2019  . Basilar artery stenosis 11/14/2014  . Occlusion and stenosis of basilar artery     Past Surgical History:  Procedure Laterality Date  . ABDOMINAL HYSTERECTOMY    . bladder tack     x 2  . BREAST SURGERY Right    cyst removed  . COLONOSCOPY    . EYE SURGERY Bilateral    cataract surgery  . INSERT / REPLACE / REMOVE PACEMAKER    . LOOP RECORDER IMPLANT  05/23/2014   [employer] Reveal loop recorder (Dr. Lisa Roca, [facility])  . RADIOLOGY WITH ANESTHESIA N/A 08/15/2014   Procedure: RADIOLOGY WITH ANESTHESIA;  Surgeon: Luanne Bras, MD;  Location: [facility];  Service: Radiology;  Laterality: N/A;  . RADIOLOGY WITH ANESTHESIA N/A 11/05/2014   Procedure: ANGIOPLASTY WITH POSSIBLE STENTING      (RADIOLOGY WITH ANESTHESIA );  Surgeon: Luanne Bras, MD;  Location: [facility];  Service: Radiology;  Laterality: N/A;  .  RADIOLOGY WITH ANESTHESIA N/A 11/14/2014   Procedure: RADIOLOGY WITH ANESTHESIA;  Surgeon: Luanne Bras, MD;  Location: [facility];  Service: Radiology;  Laterality: N/A;     OB History   No obstetric history on file.     Family History  Problem Relation Age of Onset  . Hypertension Mother   . Heart attack Mother   . Cancer Father     Social History   Tobacco Use  . Smoking status: Never Smoker  . Smokeless tobacco: Never Used  Substance Use Topics  . Alcohol use: No  . Drug use: No    Home Medications Prior to Admission medications   Medication Sig Start Date End Date Taking? Authorizing Provider  amiodarone (PACERONE) 200 MG tablet Take 200 mg by mouth 2 (two) times daily. 11/02/19   [provider]  amoxicillin (AMOXIL) 500 MG capsule Take 2,000 mg by mouth See admin instructions.  1 hour before dental procedure 06/17/14   [provider]  apixaban (ELIQUIS) 2.5 MG TABS tablet Take 2.5 mg by mouth 2 (two) times daily.    [provider]  atorvastatin (LIPITOR) 10 MG tablet Take 10 mg by mouth daily.    [provider]  calcium carbonate (OS-CAL) 600 MG TABS tablet Take 600 mg by mouth daily. 2 tabs    [provider]  Cholecalciferol (VITAMIN D3) 5000 UNITS CAPS Take 5,000 Units by mouth daily.    [provider]  conjugated estrogens (PREMARIN) vaginal cream Place 1 Applicatorful vaginally every other day.    [provider]  diclofenac sodium (VOLTAREN) 1 % GEL Apply 2 g topically 2 (two) times daily.    [provider]  diltiazem (CARDIZEM CD) 120 MG 24 hr capsule Take 120 mg by mouth 2 (two) times daily.     [provider]  diltiazem (CARDIZEM SR) 120 MG 12 hr capsule Take by mouth. 10/29/19   [provider]  donepezil (ARICEPT) 10 MG tablet Take 10 mg by mouth at bedtime. 07/28/19   [provider]  donepezil (ARICEPT) 5 MG tablet Take 5 mg by mouth daily. 11/04/19   [provider]  ezetimibe (ZETIA) 10 MG tablet Take 10 mg by mouth daily. 10/21/19   [provider]  ferrous sulfate 325 (65 FE) MG tablet Take 325 mg by mouth 2 (two) times a week. Takes on Monday & Wedensday    [provider]  gabapentin (NEURONTIN) 100 MG capsule Take 100 mg by mouth daily. 10/26/19   [provider]  gabapentin (NEURONTIN) 400 MG capsule Take 400 mg by mouth at bedtime.     [provider]  glucosamine-chondroitin 500-400 MG tablet Take 1 tablet by mouth daily.    [provider]  glycerin adult 2 g suppository Place 1 suppository rectally as needed for constipation. 11/15/19   Carollee Leitz, MD  hydrochlorothiazide (HYDRODIURIL) 25 MG tablet Take 25 mg by mouth daily. 10/31/19   [provider]  levothyroxine (SYNTHROID) 100 MCG tablet Take  100 mcg by mouth daily. 10/28/19   [provider]  levothyroxine (SYNTHROID, LEVOTHROID) 88 MCG tablet Take 88 mcg by mouth daily before breakfast.    [provider]  losartan (COZAAR) 100 MG tablet Take 100 mg by mouth daily. 10/31/19   [provider]  montelukast (SINGULAIR) 10 MG tablet Take 10 mg by mouth at bedtime.    [provider]  Multiple Vitamin (MULTIVITAMIN) tablet Take 1 tablet by mouth daily.  [provider]  omeprazole (PRILOSEC) 40 MG capsule Take 40 mg by mouth daily. 11/19/19   [provider]  ondansetron (ZOFRAN ODT) 4 MG disintegrating tablet Take 1 tablet (4 mg total) by mouth every 8 (eight) hours as needed for nausea or vomiting. 08/04/19   Gareth Morgan, MD  potassium chloride (KLOR-CON) 10 MEQ tablet Take by mouth. 11/13/19   [provider]  potassium chloride SA (KLOR-CON) 20 MEQ tablet Take 1 tablet (20 mEq total) by mouth 2 (two) times daily for 3 days. 07/31/19 08/03/19  Maudie Flakes, MD  sertraline (ZOLOFT) 50 MG tablet Take 50 mg by mouth daily.    [provider]  sulfamethoxazole-trimethoprim (BACTRIM DS) 800-160 MG tablet Take 1 tablet by mouth 2 (two) times daily. 11/09/19   [provider]  ticagrelor (BRILINTA) 90 MG TABS tablet Take 45-90 mg by mouth See admin instructions. 11/06/14-11/11/14=90 mg twice a day 11/12/14=90 mg daily 11/13/14=45 mg twice a day 11/14/14=45 mg in the morning... Unknowing dosing thereafter    [provider]  traMADol (ULTRAM) 50 MG tablet Take 50 mg by mouth daily as needed. 09/11/19   [provider]  valsartan-hydrochlorothiazide (DIOVAN-HCT) 160-12.5 MG per tablet Take 1 tablet by mouth daily.    [provider]    Allergies    Hibiclens [chlorhexidine gluconate], Benicar [olmesartan], Codeine, Erythromycin, Lipitor [atorvastatin], Other, Tetracyclines & related, Vibramycin [doxycycline], and Vicodin  [hydrocodone-acetaminophen]  Review of Systems   Review of Systems  Gastrointestinal: Positive for abdominal pain and nausea.  All other systems reviewed and are negative.   Physical Exam Updated Vital Signs BP (!) 133/92   Pulse 72   Resp 19   SpO2 99%   Physical Exam Vitals and nursing note reviewed.  Constitutional:      Comments: Chronically ill, slightly dehydrated   HENT:     Head: Normocephalic.     Nose: Nose normal.     Mouth/Throat:     Mouth: Mucous membranes are moist.  Eyes:     Extraocular Movements: Extraocular movements intact.     Pupils: Pupils are equal, round, and reactive to light.  Cardiovascular:     Rate and Rhythm: Normal rate and regular rhythm.     Pulses: Normal pulses.     Heart sounds: Normal heart sounds.  Pulmonary:     Effort: Pulmonary effort is normal.     Breath sounds: Normal breath sounds.  Abdominal:     General: Abdomen is flat.     Comments: Slightly distended mild left lower quadrant tenderness.  Musculoskeletal:        General: Normal range of motion.     Cervical back: Normal range of motion.  Skin:    General: Skin is warm.     Capillary Refill: Capillary refill takes less than 2 seconds.  Neurological:     General: No focal deficit present.     Mental Status: She is alert and oriented to person, place, and time.  Psychiatric:        Mood and Affect: Mood normal.        Behavior: Behavior normal.     ED Results / Procedures / Treatments   Labs (all labs ordered are listed, but only abnormal results are displayed) Labs Reviewed  CBC WITH DIFFERENTIAL/PLATELET - Abnormal; Notable for the following components:      Result Value   RBC 3.80 (*)    Hemoglobin 11.7 (*)    HCT 34.3 (*)  RDW 15.9 (*)    All other components within normal limits  COMPREHENSIVE METABOLIC PANEL - Abnormal; Notable for the following components:   Glucose, Bld 101 (*)    Creatinine, Ser 1.36 (*)    AST 137 (*)    ALT 158 (*)    GFR  calc non Af Amer 34 (*)    GFR calc Af Amer 39 (*)    All other components within normal limits  MAGNESIUM - Abnormal; Notable for the following components:   Magnesium 2.6 (*)    All other components within normal limits  I-STAT CHEM 8, ED - Abnormal; Notable for the following components:   Potassium 5.3 (*)    Creatinine, Ser 1.40 (*)    All other components within normal limits  TROPONIN I (HIGH SENSITIVITY) - Abnormal; Notable for the following components:   Troponin I (High Sensitivity) 84 (*)    All other components within normal limits  TROPONIN I (HIGH SENSITIVITY) - Abnormal; Notable for the following components:   Troponin I (High Sensitivity) 89 (*)    All other components within normal limits  LIPASE, BLOOD  URINALYSIS, ROUTINE W REFLEX MICROSCOPIC    EKG EKG Interpretation  Date/Time:  Wednesday November 22 2019 17:20:21 EDT Ventricular Rate:  72 PR Interval:    QRS Duration: 140 QT Interval:  448 QTC Calculation: 491 R Axis:   86 Text Interpretation: Sinus rhythm Right bundle branch block No significant change since last tracing Confirmed by Wandra Arthurs [phone]) on 11/22/2019 5:44:18 PM   Radiology CT ABDOMEN PELVIS W CONTRAST  Result Date: 11/22/2019 CLINICAL DATA:  Nausea, vomiting, loss of appetite for 2 weeks, abdominal distension, elevated liver function tests EXAM: CT ABDOMEN AND PELVIS WITH CONTRAST TECHNIQUE: Multidetector CT imaging of the abdomen and pelvis was performed using the standard protocol following bolus administration of intravenous contrast. CONTRAST:  37mL OMNIPAQUE IOHEXOL 300 MG/ML  SOLN COMPARISON:  11/15/2019, 08/04/2019 FINDINGS: Lower chest: No acute pleural or parenchymal lung disease. Hepatobiliary: Noncalcified gallstones are again identified without evidence of acute cholecystitis. Common bile duct measures 6 mm, stable. There is mild intrahepatic biliary duct dilation, slightly more pronounced than previous study. Scattered  subcentimeter hepatic cysts are unchanged. Pancreas: Unremarkable. No pancreatic ductal dilatation or surrounding inflammatory changes. Spleen: Normal in size without focal abnormality. Adrenals/Urinary Tract: Heterogeneous 2 cm mass within the upper pole right kidney reference image 19/2 grossly unchanged since prior study, suspicious for renal cell carcinoma. Stable lower pole right renal cyst. No urinary tract calculi or obstructive uropathy. Bladder is unremarkable with no filling defects. Adrenals are normal. Stomach/Bowel: No bowel obstruction or ileus. No wall thickening or inflammatory change. Minimal retained stool within the distal colon. Vascular/Lymphatic: Aortic atherosclerosis. No enlarged abdominal or pelvic lymph nodes. Reproductive: Status post hysterectomy. No adnexal masses. Other: No free fluid or free gas. No abdominal wall hernia. Musculoskeletal: No acute or destructive bony lesions. Reconstructed images demonstrate no additional findings. IMPRESSION: 1. Cholelithiasis without cholecystitis. 2. Mild intrahepatic biliary duct dilation, slightly more pronounced since prior study. Common bile duct is normal in caliber. 3. Stable heterogeneous upper pole right renal mass consistent with renal cell carcinoma. 4. Aortic Atherosclerosis (ICD10-I70.0). Electronically Signed   By: Randa Ngo M.D.   On: 11/22/2019 19:19    Procedures Procedures (including critical care time)  Medications Ordered in ED Medications  ondansetron (ZOFRAN) injection 4 mg (4 mg Intravenous Given 11/22/19 1734)  sodium chloride 0.9 % bolus 500 mL (0 mLs Intravenous Stopped  11/22/19 2044)  iohexol (OMNIPAQUE) 300 MG/ML solution 75 mL (75 mLs Intravenous Contrast Given 11/22/19 1839)    ED Course  I have reviewed the triage vital signs and the nursing notes.  Pertinent labs & imaging results that were available during my care of the patient were reviewed by me and considered in my medical decision making (see  chart for details).    MDM Rules/Calculators/A&P                          Jade Juarez is a 86 y.o. female here presenting with abdominal pain and nausea.  This appears to be a chronic problem.  Patient was seen here about a week ago for constipation and now has normal bowel movements.  However she is persistently nauseated.  At this point, will get CBC, CMP, lipase, CT abdomen pelvis to further assess.  9:39 PM Patient's LFTs are actually improved from previous.  Patient has cholelithiasis without cholecystitis.  Patient's EKG is unremarkable and her troponin is stable from 84-89.  Patient has no chest pain and this is actually lower than her previous troponin.  Patient is tolerating p.o. in the ED. Stable for discharge. Will try different nausea medicines.   Final Clinical Impression(s) / ED Diagnoses Final diagnoses:  None    Rx / DC Orders ED Discharge Orders    None       Drenda Freeze, MD 11/22/19 2146

## 2019-11-22 NOTE — Discharge Instructions (Addendum)
Continue zofran as needed.  Zofran is not helping you, you may try Phenergan suppository every 6 hours as needed.  Stay hydrated.  You have gallstones and can follow-up with a surgeon or GI doctor as originally scheduled.  Your liver enzymes are improved from previous and your heart enzyme tests are also improved from previous.  Return to ER if you have worse abdominal pain, unable to keep anything down, fever, not passing gas.

## 2019-11-23 DIAGNOSIS — R41841 Cognitive communication deficit: Secondary | ICD-10-CM | POA: Diagnosis not present

## 2019-11-23 DIAGNOSIS — R296 Repeated falls: Secondary | ICD-10-CM | POA: Diagnosis not present

## 2019-11-23 DIAGNOSIS — R278 Other lack of coordination: Secondary | ICD-10-CM | POA: Diagnosis not present

## 2019-11-23 DIAGNOSIS — M6281 Muscle weakness (generalized): Secondary | ICD-10-CM | POA: Diagnosis not present

## 2019-11-23 DIAGNOSIS — R1312 Dysphagia, oropharyngeal phase: Secondary | ICD-10-CM | POA: Diagnosis not present

## 2019-11-23 DIAGNOSIS — R2681 Unsteadiness on feet: Secondary | ICD-10-CM | POA: Diagnosis not present

## 2019-11-23 DIAGNOSIS — R488 Other symbolic dysfunctions: Secondary | ICD-10-CM | POA: Diagnosis not present

## 2019-11-24 DIAGNOSIS — R41841 Cognitive communication deficit: Secondary | ICD-10-CM | POA: Diagnosis not present

## 2019-11-24 DIAGNOSIS — M6281 Muscle weakness (generalized): Secondary | ICD-10-CM | POA: Diagnosis not present

## 2019-11-24 DIAGNOSIS — R296 Repeated falls: Secondary | ICD-10-CM | POA: Diagnosis not present

## 2019-11-24 DIAGNOSIS — R278 Other lack of coordination: Secondary | ICD-10-CM | POA: Diagnosis not present

## 2019-11-24 DIAGNOSIS — R2681 Unsteadiness on feet: Secondary | ICD-10-CM | POA: Diagnosis not present

## 2019-11-24 DIAGNOSIS — R1312 Dysphagia, oropharyngeal phase: Secondary | ICD-10-CM | POA: Diagnosis not present

## 2019-11-24 DIAGNOSIS — R488 Other symbolic dysfunctions: Secondary | ICD-10-CM | POA: Diagnosis not present

## 2019-11-25 DIAGNOSIS — R7401 Elevation of levels of liver transaminase levels: Secondary | ICD-10-CM | POA: Diagnosis not present

## 2019-11-25 DIAGNOSIS — K5909 Other constipation: Secondary | ICD-10-CM | POA: Diagnosis not present

## 2019-11-25 DIAGNOSIS — R1110 Vomiting, unspecified: Secondary | ICD-10-CM | POA: Diagnosis not present

## 2019-11-25 DIAGNOSIS — R110 Nausea: Secondary | ICD-10-CM | POA: Diagnosis not present

## 2019-11-27 ENCOUNTER — Emergency Department (HOSPITAL_COMMUNITY)
Admission: EM | Admit: 2019-11-27 | Discharge: 2019-11-27 | Disposition: A | Discharge status: Home / Self Care | Payer: PPO | Attending: Emergency Medicine | Admitting: Emergency Medicine

## 2019-11-27 ENCOUNTER — Emergency Department (HOSPITAL_COMMUNITY): Payer: PPO

## 2019-11-27 ENCOUNTER — Telehealth: Payer: Self-pay | Admitting: *Deleted

## 2019-11-27 DIAGNOSIS — R42 Dizziness and giddiness: Secondary | ICD-10-CM | POA: Diagnosis not present

## 2019-11-27 DIAGNOSIS — Y929 Unspecified place or not applicable: Secondary | ICD-10-CM | POA: Insufficient documentation

## 2019-11-27 DIAGNOSIS — S0990XA Unspecified injury of head, initial encounter: Secondary | ICD-10-CM | POA: Diagnosis not present

## 2019-11-27 DIAGNOSIS — Y999 Unspecified external cause status: Secondary | ICD-10-CM | POA: Insufficient documentation

## 2019-11-27 DIAGNOSIS — M25561 Pain in right knee: Secondary | ICD-10-CM | POA: Diagnosis not present

## 2019-11-27 DIAGNOSIS — R0902 Hypoxemia: Secondary | ICD-10-CM | POA: Diagnosis not present

## 2019-11-27 DIAGNOSIS — J189 Pneumonia, unspecified organism: Secondary | ICD-10-CM

## 2019-11-27 DIAGNOSIS — Y939 Activity, unspecified: Secondary | ICD-10-CM | POA: Diagnosis not present

## 2019-11-27 DIAGNOSIS — J181 Lobar pneumonia, unspecified organism: Secondary | ICD-10-CM | POA: Diagnosis not present

## 2019-11-27 DIAGNOSIS — Z7901 Long term (current) use of anticoagulants: Secondary | ICD-10-CM | POA: Diagnosis not present

## 2019-11-27 DIAGNOSIS — M25461 Effusion, right knee: Secondary | ICD-10-CM | POA: Diagnosis not present

## 2019-11-27 DIAGNOSIS — R110 Nausea: Secondary | ICD-10-CM | POA: Diagnosis not present

## 2019-11-27 DIAGNOSIS — I10 Essential (primary) hypertension: Secondary | ICD-10-CM | POA: Diagnosis not present

## 2019-11-27 DIAGNOSIS — S199XXA Unspecified injury of neck, initial encounter: Secondary | ICD-10-CM | POA: Diagnosis not present

## 2019-11-27 DIAGNOSIS — W01198A Fall on same level from slipping, tripping and stumbling with subsequent striking against other object, initial encounter: Secondary | ICD-10-CM | POA: Insufficient documentation

## 2019-11-27 DIAGNOSIS — R000 Tachycardia, unspecified: Secondary | ICD-10-CM | POA: Diagnosis not present

## 2019-11-27 DIAGNOSIS — W19XXXA Unspecified fall, initial encounter: Secondary | ICD-10-CM

## 2019-11-27 DIAGNOSIS — I4519 Other right bundle-branch block: Secondary | ICD-10-CM | POA: Diagnosis not present

## 2019-11-27 DIAGNOSIS — M25562 Pain in left knee: Secondary | ICD-10-CM | POA: Diagnosis not present

## 2019-11-27 MED ORDER — LEVOFLOXACIN 750 MG PO TABS
750.0000 mg | ORAL_TABLET | Freq: Once | ORAL | Status: AC
  Administered 2019-11-27: 750 mg via ORAL
  Filled 2019-11-27: qty 1

## 2019-11-27 MED ORDER — LEVOFLOXACIN 750 MG PO TABS: 750.0000 mg | ORAL_TABLET | Freq: Every day | ORAL | 0 refills | Status: AC

## 2019-11-27 NOTE — ED Notes (Signed)
Help get patient undress on the monitor into a gown patient is resting with call bell in reach

## 2019-11-27 NOTE — Telephone Encounter (Signed)
TOC CM received a call from pharmacy stating the [place] will interact with Amiodarone. Checked system and did not see prescription for Levaquin. Message sent to ED provider on Rx, Dr Rogene Houston. [place], [place] ED TOC CM [phone]

## 2019-11-27 NOTE — ED Notes (Signed)
Report called to [employer] [facility] and notified daughter as well. Awaiting PTAR.

## 2019-11-27 NOTE — Discharge Instructions (Signed)
No significant injuries from the fall.  Chest x-ray does show evidence of left upper lobe opacity felt to be consistent with a pneumonia.  Take the antibiotic Levaquin as directed.  Prescription provided.  Would recommend treatment for 5 days.  CT head neck and x-rays of both knees without any acute findings.  Stable to return back to [facility].

## 2019-11-27 NOTE — ED Provider Notes (Signed)
[place] EMERGENCY DEPARTMENT Provider Note   CSN: [id number] Arrival date & time: 11/27/19  1024     History Chief Complaint  Patient presents with  . Fall    on blood thinners    Jade Juarez is a 86 y.o. female.  Patient brought in from [facility] [place].  Had a fall.  Patient states she did strike her head.  She is on Eliquis.  No loss of consciousness.  Also with a complaint of bilateral knee pain.  Patient has c-collar on.  Brought in by EMS.  Patient denies any hip or pelvic pain.  Patient denies any loss of consciousness.        No past medical history on file.  There are no problems to display for this patient.      OB History   No obstetric history on file.     No family history on file.  Social History   Tobacco Use  . Smoking status: Not on file  Substance Use Topics  . Alcohol use: Not on file  . Drug use: Not on file    Home Medications Prior to Admission medications   Medication Sig Start Date End Date Taking? Authorizing Provider  levofloxacin (LEVAQUIN) 750 MG tablet Take 1 tablet (750 mg total) by mouth daily. 11/27/19   Fredia Sorrow, MD    Allergies    Patient has no allergy information on record.  Review of Systems   Review of Systems  Constitutional: Negative for chills and fever.  HENT: Negative for congestion, rhinorrhea and sore throat.   Eyes: Negative for visual disturbance.  Respiratory: Negative for cough and shortness of breath.   Cardiovascular: Negative for chest pain and leg swelling.  Gastrointestinal: Negative for abdominal pain, diarrhea, nausea and vomiting.  Genitourinary: Negative for dysuria.  Musculoskeletal: Positive for joint swelling. Negative for back pain and neck pain.  Skin: Negative for rash.  Neurological: Negative for dizziness, light-headedness and headaches.  Hematological: Does not bruise/bleed easily.  Psychiatric/Behavioral: Negative for confusion.    Physical  Exam Updated Vital Signs BP (!) 154/72   Pulse 73   Temp 98.7 F (37.1 C) (Oral)   Resp 18   Ht 1.575 m (5\' 2" )   Wt 70.8 kg   SpO2 100%   BMI 28.53 kg/m   Physical Exam Vitals and nursing note reviewed.  Constitutional:      General: She is not in acute distress.    Appearance: Normal appearance. She is well-developed.  HENT:     Head: Normocephalic and atraumatic.  Eyes:     Extraocular Movements: Extraocular movements intact.     Conjunctiva/sclera: Conjunctivae normal.     Pupils: Pupils are equal, round, and reactive to light.  Neck:     Comments: C-collar in place. Cardiovascular:     Rate and Rhythm: Normal rate and regular rhythm.     Heart sounds: No murmur heard.   Pulmonary:     Effort: Pulmonary effort is normal. No respiratory distress.     Breath sounds: Normal breath sounds.  Abdominal:     Palpations: Abdomen is soft.     Tenderness: There is no abdominal tenderness.  Musculoskeletal:        General: Swelling and tenderness present. Normal range of motion.     Comments: Swelling to right knee.  No distinct effusion.  No swelling at the ankle to both legs.  Tenderness to palpation to both knees anteriorly.  Left knee without any swelling  no abrasions to either knee.  Skin:    General: Skin is warm and dry.     Capillary Refill: Capillary refill takes less than 2 seconds.  Neurological:     General: No focal deficit present.     Mental Status: She is alert and oriented to person, place, and time.     Cranial Nerves: No cranial nerve deficit.     Sensory: No sensory deficit.     Motor: No weakness.     ED Results / Procedures / Treatments   Labs (all labs ordered are listed, but only abnormal results are displayed) Labs Reviewed - No data to display  EKG EKG Interpretation  Date/Time:  Monday November 27 2019 11:15:25 EDT Ventricular Rate:  72 PR Interval:    QRS Duration: 147 QT Interval:  461 QTC Calculation: 505 R Axis:   86 Text  Interpretation: Atrial-paced rhythm Right bundle branch block No previous ECGs available Reconfirmed by Fredia Sorrow [phone]) on 11/27/2019 11:54:33 AM   Radiology DG Chest 2 View  Result Date: 11/27/2019 CLINICAL DATA:  Airspace opacity left upper lobe on recent CT EXAM: CHEST - 2 VIEW COMPARISON:  Cervical CT including upper lung regions November 27, 2019 FINDINGS: There is subtle ill-defined opacity in the left upper lobe peripherally. Lungs elsewhere are clear. The heart size and pulmonary vascularity are normal. No adenopathy. Pacemaker leads are attached to the right atrium and right ventricle. No pneumothorax. No bone lesions. IMPRESSION: Subtle ill-defined opacity in the periphery of the left upper lobe, likely early pneumonia. Lungs elsewhere clear. Cardiac silhouette within normal limits. Pacemaker leads attached to right atrium and right ventricle. Electronically Signed   By: Lowella Grip III M.D.   On: 11/27/2019 11:45   CT Head Wo Contrast  Result Date: 11/27/2019 CLINICAL DATA:  Status post fall today.  Initial encounter. EXAM: CT HEAD WITHOUT CONTRAST CT CERVICAL SPINE WITHOUT CONTRAST TECHNIQUE: Multidetector CT imaging of the head and cervical spine was performed following the standard protocol without intravenous contrast. Multiplanar CT image reconstructions of the cervical spine were also generated. COMPARISON:  Head and cervical spine CT scans 11/12/2019. FINDINGS: CT HEAD FINDINGS Brain: No evidence of acute infarction, hemorrhage, hydrocephalus, extra-axial collection or mass lesion/mass effect. Atrophy, extensive microvascular ischemic change and remote lacunar infarction versus dilated perivascular space left basal ganglia are unchanged. Vascular: No hyperdense vessel or unexpected calcification. Skull: Intact.  No focal lesion. Sinuses/Orbits: Status post cataract surgery. Paranasal sinuses and mastoid air cells are clear. Other: None. CT CERVICAL SPINE FINDINGS Alignment:  Mild reversal of lordosis and trace anterolisthesis C7 on T1, unchanged. Skull base and vertebrae: No acute fracture. No primary bone lesion or focal pathologic process. Soft tissues and spinal canal: No prevertebral fluid or swelling. No visible canal hematoma. Disc levels: Marked multilevel loss of disc space height again seen. Upper chest: There is a small focus of opacity in the periphery of the left upper lobe which is incompletely visualized. Other: None. IMPRESSION: No acute abnormality chest or cervical spine. Partially visualized left upper lobe opacity. Recommend correlation with PA and lateral chest films. Atrophy and extensive chronic microvascular ischemic change. Multilevel cervical spondylosis. Electronically Signed   By: Inge Rise M.D.   On: 11/27/2019 10:57   CT Cervical Spine Wo Contrast  Result Date: 11/27/2019 CLINICAL DATA:  Status post fall today.  Initial encounter. EXAM: CT HEAD WITHOUT CONTRAST CT CERVICAL SPINE WITHOUT CONTRAST TECHNIQUE: Multidetector CT imaging of the head and cervical spine was performed  following the standard protocol without intravenous contrast. Multiplanar CT image reconstructions of the cervical spine were also generated. COMPARISON:  Head and cervical spine CT scans 11/12/2019. FINDINGS: CT HEAD FINDINGS Brain: No evidence of acute infarction, hemorrhage, hydrocephalus, extra-axial collection or mass lesion/mass effect. Atrophy, extensive microvascular ischemic change and remote lacunar infarction versus dilated perivascular space left basal ganglia are unchanged. Vascular: No hyperdense vessel or unexpected calcification. Skull: Intact.  No focal lesion. Sinuses/Orbits: Status post cataract surgery. Paranasal sinuses and mastoid air cells are clear. Other: None. CT CERVICAL SPINE FINDINGS Alignment: Mild reversal of lordosis and trace anterolisthesis C7 on T1, unchanged. Skull base and vertebrae: No acute fracture. No primary bone lesion or focal  pathologic process. Soft tissues and spinal canal: No prevertebral fluid or swelling. No visible canal hematoma. Disc levels: Marked multilevel loss of disc space height again seen. Upper chest: There is a small focus of opacity in the periphery of the left upper lobe which is incompletely visualized. Other: None. IMPRESSION: No acute abnormality chest or cervical spine. Partially visualized left upper lobe opacity. Recommend correlation with PA and lateral chest films. Atrophy and extensive chronic microvascular ischemic change. Multilevel cervical spondylosis. Electronically Signed   By: Inge Rise M.D.   On: 11/27/2019 10:57   DG Knee Complete 4 Views Left  Result Date: 11/27/2019 CLINICAL DATA:  Pain following fall EXAM: LEFT KNEE - COMPLETE 4+ VIEW COMPARISON:  None. FINDINGS: Frontal, lateral, and bilateral oblique views were obtained. There is no fracture or dislocation. No joint effusion. There is moderate narrowing medially. Other joint spaces appear unremarkable. There is spurring along the anterior patella. Occasional foci of arterial vascular calcification noted. IMPRESSION: Narrowing medially. Other joint spaces appear unremarkable. No fracture, dislocation, or joint effusion. Spurring along the anterior patella likely represents distal quadriceps and proximal popliteal calcific tendinosis. Electronically Signed   By: Lowella Grip III M.D.   On: 11/27/2019 11:04   DG Knee Complete 4 Views Right  Result Date: 11/27/2019 CLINICAL DATA:  Pain following fall EXAM: RIGHT KNEE - COMPLETE 4+ VIEW COMPARISON:  None. FINDINGS: Frontal, lateral, and bilateral oblique views were obtained. There is no fracture or dislocation. No joint effusion. There is mild generalized joint space narrowing with spurring medially and arising from the posterior patella. There is mild spurring along the anterior superior patella. Scattered foci of arterial vascular calcification noted. IMPRESSION: Osteoarthritic  change with involvement of all compartments, slightly more notable medially. No fracture, dislocation, or joint effusion. Mild spurring along the anterior superior patella likely represents distal quadriceps tendinosis. Electronically Signed   By: Lowella Grip III M.D.   On: 11/27/2019 11:05    Procedures Procedures (including critical care time)  Medications Ordered in ED Medications  levofloxacin (LEVAQUIN) tablet 750 mg (has no administration in time range)    ED Course  I have reviewed the triage vital signs and the nursing notes.  Pertinent labs & imaging results that were available during my care of the patient were reviewed by me and considered in my medical decision making (see chart for details).    MDM Rules/Calculators/A&P                          Patient with a fall nursing facility is on Eliquis.  Will CT head and neck c-collar still in place.  Will x-ray both knees.  On exam no evidence of any hip pain or pelvic pain.  No low back pain.  No abdominal pain no chest pain.  CT head and neck without any acute findings.  However the upper parts of the lung field raise some concern about an upper lobe opacity and they recommended chest x-ray PA and lateral so we will order that.  X-rays of both knees without any acute injury.  A lot of arthritic changes in the right knee which probably explains why that knee is larger and has some swelling.  Patient remains alert in no acute distress.  We will see what the results are of the chest x-ray.  Chest x-ray does seem to be consistent with a opacity in left upper lobe.  Radiology feels it may represent a pneumonia.  So we will treat with antibiotic.  Patient is a nursing home patient.  Although the distinction between community-acquired and hospital-acquired is no longer as relevant as it was.  I feel patient does have significant past medical problems.  So we will treat with Levaquin.  No significant penicillin allergy but the single  dose regimen of Levaquin will probably be more practical for the patient.  Patient stable to return to nursing facility.  Final Clinical Impression(s) / ED Diagnoses Final diagnoses:  Fall, initial encounter  Injury of head, initial encounter  Acute pain of both knees  Community acquired pneumonia of left upper lobe of lung    Rx / DC Orders ED Discharge Orders         Ordered    levofloxacin (LEVAQUIN) 750 MG tablet  Daily        11/27/19 1201           Fredia Sorrow, MD 11/27/19 1204

## 2019-11-27 NOTE — Progress Notes (Signed)
Orthopedic Tech Progress Note Patient Details:  Jade Juarez July 15, 1926 [id number] Level 2 trauma Patient ID: Jade Juarez, female   DOB: Jun 09, 1926, 86 y.o.   MRN: [id number]   Janit Pagan 11/27/2019, 10:37 AM

## 2019-11-27 NOTE — Telephone Encounter (Signed)
TOC CM received call from [facility] stating Levaquin interacts with Amiodarone. Pt with same name and dob in Epic. Message sent to ED provider, Dr Rogene Houston. [place], [place] ED TOC CM [phone]

## 2019-11-28 DIAGNOSIS — R2681 Unsteadiness on feet: Secondary | ICD-10-CM | POA: Diagnosis not present

## 2019-11-28 DIAGNOSIS — R278 Other lack of coordination: Secondary | ICD-10-CM | POA: Diagnosis not present

## 2019-11-28 DIAGNOSIS — R296 Repeated falls: Secondary | ICD-10-CM | POA: Diagnosis not present

## 2019-11-28 DIAGNOSIS — M6281 Muscle weakness (generalized): Secondary | ICD-10-CM | POA: Diagnosis not present

## 2019-11-28 DIAGNOSIS — R41841 Cognitive communication deficit: Secondary | ICD-10-CM | POA: Diagnosis not present

## 2019-11-28 DIAGNOSIS — R488 Other symbolic dysfunctions: Secondary | ICD-10-CM | POA: Diagnosis not present

## 2019-11-28 DIAGNOSIS — R1312 Dysphagia, oropharyngeal phase: Secondary | ICD-10-CM | POA: Diagnosis not present

## 2019-11-29 DIAGNOSIS — K8020 Calculus of gallbladder without cholecystitis without obstruction: Secondary | ICD-10-CM | POA: Diagnosis not present

## 2019-11-29 DIAGNOSIS — R1312 Dysphagia, oropharyngeal phase: Secondary | ICD-10-CM | POA: Diagnosis not present

## 2019-11-29 DIAGNOSIS — R7401 Elevation of levels of liver transaminase levels: Secondary | ICD-10-CM | POA: Diagnosis not present

## 2019-11-29 DIAGNOSIS — C641 Malignant neoplasm of right kidney, except renal pelvis: Secondary | ICD-10-CM | POA: Diagnosis not present

## 2019-11-29 DIAGNOSIS — R41841 Cognitive communication deficit: Secondary | ICD-10-CM | POA: Diagnosis not present

## 2019-11-29 DIAGNOSIS — R634 Abnormal weight loss: Secondary | ICD-10-CM | POA: Diagnosis not present

## 2019-11-29 DIAGNOSIS — R2681 Unsteadiness on feet: Secondary | ICD-10-CM | POA: Diagnosis not present

## 2019-11-29 DIAGNOSIS — M6281 Muscle weakness (generalized): Secondary | ICD-10-CM | POA: Diagnosis not present

## 2019-11-29 DIAGNOSIS — K5909 Other constipation: Secondary | ICD-10-CM | POA: Diagnosis not present

## 2019-11-29 DIAGNOSIS — R110 Nausea: Secondary | ICD-10-CM | POA: Diagnosis not present

## 2019-11-29 DIAGNOSIS — J188 Other pneumonia, unspecified organism: Secondary | ICD-10-CM | POA: Diagnosis not present

## 2019-11-29 DIAGNOSIS — R296 Repeated falls: Secondary | ICD-10-CM | POA: Diagnosis not present

## 2019-11-29 DIAGNOSIS — R935 Abnormal findings on diagnostic imaging of other abdominal regions, including retroperitoneum: Secondary | ICD-10-CM | POA: Diagnosis not present

## 2019-11-29 DIAGNOSIS — R488 Other symbolic dysfunctions: Secondary | ICD-10-CM | POA: Diagnosis not present

## 2019-11-29 DIAGNOSIS — R7989 Other specified abnormal findings of blood chemistry: Secondary | ICD-10-CM | POA: Diagnosis not present

## 2019-11-29 DIAGNOSIS — R278 Other lack of coordination: Secondary | ICD-10-CM | POA: Diagnosis not present

## 2019-11-30 DIAGNOSIS — R278 Other lack of coordination: Secondary | ICD-10-CM | POA: Diagnosis not present

## 2019-11-30 DIAGNOSIS — R1312 Dysphagia, oropharyngeal phase: Secondary | ICD-10-CM | POA: Diagnosis not present

## 2019-11-30 DIAGNOSIS — R296 Repeated falls: Secondary | ICD-10-CM | POA: Diagnosis not present

## 2019-11-30 DIAGNOSIS — R488 Other symbolic dysfunctions: Secondary | ICD-10-CM | POA: Diagnosis not present

## 2019-11-30 DIAGNOSIS — M6281 Muscle weakness (generalized): Secondary | ICD-10-CM | POA: Diagnosis not present

## 2019-11-30 DIAGNOSIS — R41841 Cognitive communication deficit: Secondary | ICD-10-CM | POA: Diagnosis not present

## 2019-11-30 DIAGNOSIS — R2681 Unsteadiness on feet: Secondary | ICD-10-CM | POA: Diagnosis not present

## 2019-12-01 DIAGNOSIS — R488 Other symbolic dysfunctions: Secondary | ICD-10-CM | POA: Diagnosis not present

## 2019-12-01 DIAGNOSIS — R1312 Dysphagia, oropharyngeal phase: Secondary | ICD-10-CM | POA: Diagnosis not present

## 2019-12-01 DIAGNOSIS — R2681 Unsteadiness on feet: Secondary | ICD-10-CM | POA: Diagnosis not present

## 2019-12-01 DIAGNOSIS — R41841 Cognitive communication deficit: Secondary | ICD-10-CM | POA: Diagnosis not present

## 2019-12-01 DIAGNOSIS — R278 Other lack of coordination: Secondary | ICD-10-CM | POA: Diagnosis not present

## 2019-12-01 DIAGNOSIS — R296 Repeated falls: Secondary | ICD-10-CM | POA: Diagnosis not present

## 2019-12-01 DIAGNOSIS — M6281 Muscle weakness (generalized): Secondary | ICD-10-CM | POA: Diagnosis not present

## 2019-12-04 DIAGNOSIS — R1110 Vomiting, unspecified: Secondary | ICD-10-CM | POA: Diagnosis not present

## 2019-12-04 DIAGNOSIS — R1312 Dysphagia, oropharyngeal phase: Secondary | ICD-10-CM | POA: Diagnosis not present

## 2019-12-04 DIAGNOSIS — M6281 Muscle weakness (generalized): Secondary | ICD-10-CM | POA: Diagnosis not present

## 2019-12-04 DIAGNOSIS — R2681 Unsteadiness on feet: Secondary | ICD-10-CM | POA: Diagnosis not present

## 2019-12-04 DIAGNOSIS — R110 Nausea: Secondary | ICD-10-CM | POA: Diagnosis not present

## 2019-12-04 DIAGNOSIS — R41841 Cognitive communication deficit: Secondary | ICD-10-CM | POA: Diagnosis not present

## 2019-12-04 DIAGNOSIS — R488 Other symbolic dysfunctions: Secondary | ICD-10-CM | POA: Diagnosis not present

## 2019-12-04 DIAGNOSIS — R278 Other lack of coordination: Secondary | ICD-10-CM | POA: Diagnosis not present

## 2019-12-04 DIAGNOSIS — J690 Pneumonitis due to inhalation of food and vomit: Secondary | ICD-10-CM | POA: Diagnosis not present

## 2019-12-04 DIAGNOSIS — R296 Repeated falls: Secondary | ICD-10-CM | POA: Diagnosis not present

## 2019-12-05 DIAGNOSIS — R1312 Dysphagia, oropharyngeal phase: Secondary | ICD-10-CM | POA: Diagnosis not present

## 2019-12-05 DIAGNOSIS — R2681 Unsteadiness on feet: Secondary | ICD-10-CM | POA: Diagnosis not present

## 2019-12-05 DIAGNOSIS — R278 Other lack of coordination: Secondary | ICD-10-CM | POA: Diagnosis not present

## 2019-12-05 DIAGNOSIS — M6281 Muscle weakness (generalized): Secondary | ICD-10-CM | POA: Diagnosis not present

## 2019-12-05 DIAGNOSIS — R41841 Cognitive communication deficit: Secondary | ICD-10-CM | POA: Diagnosis not present

## 2019-12-05 DIAGNOSIS — R296 Repeated falls: Secondary | ICD-10-CM | POA: Diagnosis not present

## 2019-12-05 DIAGNOSIS — R488 Other symbolic dysfunctions: Secondary | ICD-10-CM | POA: Diagnosis not present

## 2019-12-06 DIAGNOSIS — W19XXXA Unspecified fall, initial encounter: Secondary | ICD-10-CM | POA: Diagnosis not present

## 2019-12-06 DIAGNOSIS — R488 Other symbolic dysfunctions: Secondary | ICD-10-CM | POA: Diagnosis not present

## 2019-12-06 DIAGNOSIS — J309 Allergic rhinitis, unspecified: Secondary | ICD-10-CM | POA: Diagnosis not present

## 2019-12-06 DIAGNOSIS — R41841 Cognitive communication deficit: Secondary | ICD-10-CM | POA: Diagnosis not present

## 2019-12-06 DIAGNOSIS — K5909 Other constipation: Secondary | ICD-10-CM | POA: Diagnosis not present

## 2019-12-06 DIAGNOSIS — R296 Repeated falls: Secondary | ICD-10-CM | POA: Diagnosis not present

## 2019-12-06 DIAGNOSIS — R110 Nausea: Secondary | ICD-10-CM | POA: Diagnosis not present

## 2019-12-06 DIAGNOSIS — R2681 Unsteadiness on feet: Secondary | ICD-10-CM | POA: Diagnosis not present

## 2019-12-06 DIAGNOSIS — J188 Other pneumonia, unspecified organism: Secondary | ICD-10-CM | POA: Diagnosis not present

## 2019-12-06 DIAGNOSIS — R1312 Dysphagia, oropharyngeal phase: Secondary | ICD-10-CM | POA: Diagnosis not present

## 2019-12-06 DIAGNOSIS — M6281 Muscle weakness (generalized): Secondary | ICD-10-CM | POA: Diagnosis not present

## 2019-12-06 DIAGNOSIS — R278 Other lack of coordination: Secondary | ICD-10-CM | POA: Diagnosis not present

## 2019-12-07 DIAGNOSIS — R2681 Unsteadiness on feet: Secondary | ICD-10-CM | POA: Diagnosis not present

## 2019-12-07 DIAGNOSIS — R1312 Dysphagia, oropharyngeal phase: Secondary | ICD-10-CM | POA: Diagnosis not present

## 2019-12-07 DIAGNOSIS — R296 Repeated falls: Secondary | ICD-10-CM | POA: Diagnosis not present

## 2019-12-07 DIAGNOSIS — R41841 Cognitive communication deficit: Secondary | ICD-10-CM | POA: Diagnosis not present

## 2019-12-07 DIAGNOSIS — M6281 Muscle weakness (generalized): Secondary | ICD-10-CM | POA: Diagnosis not present

## 2019-12-07 DIAGNOSIS — R278 Other lack of coordination: Secondary | ICD-10-CM | POA: Diagnosis not present

## 2019-12-07 DIAGNOSIS — R488 Other symbolic dysfunctions: Secondary | ICD-10-CM | POA: Diagnosis not present

## 2019-12-08 DIAGNOSIS — R41841 Cognitive communication deficit: Secondary | ICD-10-CM | POA: Diagnosis not present

## 2019-12-08 DIAGNOSIS — R1312 Dysphagia, oropharyngeal phase: Secondary | ICD-10-CM | POA: Diagnosis not present

## 2019-12-08 DIAGNOSIS — R278 Other lack of coordination: Secondary | ICD-10-CM | POA: Diagnosis not present

## 2019-12-08 DIAGNOSIS — R488 Other symbolic dysfunctions: Secondary | ICD-10-CM | POA: Diagnosis not present

## 2019-12-08 DIAGNOSIS — R296 Repeated falls: Secondary | ICD-10-CM | POA: Diagnosis not present

## 2019-12-08 DIAGNOSIS — M6281 Muscle weakness (generalized): Secondary | ICD-10-CM | POA: Diagnosis not present

## 2019-12-08 DIAGNOSIS — R2681 Unsteadiness on feet: Secondary | ICD-10-CM | POA: Diagnosis not present

## 2019-12-12 DIAGNOSIS — R1312 Dysphagia, oropharyngeal phase: Secondary | ICD-10-CM | POA: Diagnosis not present

## 2019-12-12 DIAGNOSIS — R41841 Cognitive communication deficit: Secondary | ICD-10-CM | POA: Diagnosis not present

## 2019-12-12 DIAGNOSIS — R2681 Unsteadiness on feet: Secondary | ICD-10-CM | POA: Diagnosis not present

## 2019-12-12 DIAGNOSIS — R278 Other lack of coordination: Secondary | ICD-10-CM | POA: Diagnosis not present

## 2019-12-12 DIAGNOSIS — R296 Repeated falls: Secondary | ICD-10-CM | POA: Diagnosis not present

## 2019-12-12 DIAGNOSIS — R488 Other symbolic dysfunctions: Secondary | ICD-10-CM | POA: Diagnosis not present

## 2019-12-12 DIAGNOSIS — M6281 Muscle weakness (generalized): Secondary | ICD-10-CM | POA: Diagnosis not present

## 2019-12-13 DIAGNOSIS — I5032 Chronic diastolic (congestive) heart failure: Secondary | ICD-10-CM | POA: Diagnosis not present

## 2019-12-13 DIAGNOSIS — R296 Repeated falls: Secondary | ICD-10-CM | POA: Diagnosis not present

## 2019-12-13 DIAGNOSIS — R278 Other lack of coordination: Secondary | ICD-10-CM | POA: Diagnosis not present

## 2019-12-13 DIAGNOSIS — R1312 Dysphagia, oropharyngeal phase: Secondary | ICD-10-CM | POA: Diagnosis not present

## 2019-12-13 DIAGNOSIS — R488 Other symbolic dysfunctions: Secondary | ICD-10-CM | POA: Diagnosis not present

## 2019-12-13 DIAGNOSIS — R41841 Cognitive communication deficit: Secondary | ICD-10-CM | POA: Diagnosis not present

## 2019-12-13 DIAGNOSIS — M6281 Muscle weakness (generalized): Secondary | ICD-10-CM | POA: Diagnosis not present

## 2019-12-13 DIAGNOSIS — R1030 Lower abdominal pain, unspecified: Secondary | ICD-10-CM | POA: Diagnosis not present

## 2019-12-13 DIAGNOSIS — R2681 Unsteadiness on feet: Secondary | ICD-10-CM | POA: Diagnosis not present

## 2019-12-13 DIAGNOSIS — R634 Abnormal weight loss: Secondary | ICD-10-CM | POA: Diagnosis not present

## 2019-12-14 DIAGNOSIS — M6281 Muscle weakness (generalized): Secondary | ICD-10-CM | POA: Diagnosis not present

## 2019-12-14 DIAGNOSIS — R488 Other symbolic dysfunctions: Secondary | ICD-10-CM | POA: Diagnosis not present

## 2019-12-14 DIAGNOSIS — R296 Repeated falls: Secondary | ICD-10-CM | POA: Diagnosis not present

## 2019-12-14 DIAGNOSIS — R2681 Unsteadiness on feet: Secondary | ICD-10-CM | POA: Diagnosis not present

## 2019-12-14 DIAGNOSIS — I651 Occlusion and stenosis of basilar artery: Secondary | ICD-10-CM | POA: Diagnosis not present

## 2019-12-14 DIAGNOSIS — R269 Unspecified abnormalities of gait and mobility: Secondary | ICD-10-CM | POA: Diagnosis not present

## 2019-12-14 DIAGNOSIS — R278 Other lack of coordination: Secondary | ICD-10-CM | POA: Diagnosis not present

## 2019-12-14 DIAGNOSIS — R41841 Cognitive communication deficit: Secondary | ICD-10-CM | POA: Diagnosis not present

## 2019-12-14 DIAGNOSIS — R32 Unspecified urinary incontinence: Secondary | ICD-10-CM | POA: Diagnosis not present

## 2019-12-14 DIAGNOSIS — R1312 Dysphagia, oropharyngeal phase: Secondary | ICD-10-CM | POA: Diagnosis not present

## 2019-12-15 DIAGNOSIS — R296 Repeated falls: Secondary | ICD-10-CM | POA: Diagnosis not present

## 2019-12-15 DIAGNOSIS — R278 Other lack of coordination: Secondary | ICD-10-CM | POA: Diagnosis not present

## 2019-12-15 DIAGNOSIS — R41841 Cognitive communication deficit: Secondary | ICD-10-CM | POA: Diagnosis not present

## 2019-12-15 DIAGNOSIS — R488 Other symbolic dysfunctions: Secondary | ICD-10-CM | POA: Diagnosis not present

## 2019-12-15 DIAGNOSIS — M6281 Muscle weakness (generalized): Secondary | ICD-10-CM | POA: Diagnosis not present

## 2019-12-15 DIAGNOSIS — R1312 Dysphagia, oropharyngeal phase: Secondary | ICD-10-CM | POA: Diagnosis not present

## 2019-12-15 DIAGNOSIS — R1030 Lower abdominal pain, unspecified: Secondary | ICD-10-CM | POA: Diagnosis not present

## 2019-12-15 DIAGNOSIS — K5909 Other constipation: Secondary | ICD-10-CM | POA: Diagnosis not present

## 2019-12-15 DIAGNOSIS — I5032 Chronic diastolic (congestive) heart failure: Secondary | ICD-10-CM | POA: Diagnosis not present

## 2019-12-15 DIAGNOSIS — R2681 Unsteadiness on feet: Secondary | ICD-10-CM | POA: Diagnosis not present

## 2019-12-18 DIAGNOSIS — R41841 Cognitive communication deficit: Secondary | ICD-10-CM | POA: Diagnosis not present

## 2019-12-18 DIAGNOSIS — R488 Other symbolic dysfunctions: Secondary | ICD-10-CM | POA: Diagnosis not present

## 2019-12-18 DIAGNOSIS — R2681 Unsteadiness on feet: Secondary | ICD-10-CM | POA: Diagnosis not present

## 2019-12-18 DIAGNOSIS — R1312 Dysphagia, oropharyngeal phase: Secondary | ICD-10-CM | POA: Diagnosis not present

## 2019-12-18 DIAGNOSIS — M6281 Muscle weakness (generalized): Secondary | ICD-10-CM | POA: Diagnosis not present

## 2019-12-18 DIAGNOSIS — R296 Repeated falls: Secondary | ICD-10-CM | POA: Diagnosis not present

## 2019-12-18 DIAGNOSIS — R278 Other lack of coordination: Secondary | ICD-10-CM | POA: Diagnosis not present

## 2019-12-19 DIAGNOSIS — M6281 Muscle weakness (generalized): Secondary | ICD-10-CM | POA: Diagnosis not present

## 2019-12-19 DIAGNOSIS — R1312 Dysphagia, oropharyngeal phase: Secondary | ICD-10-CM | POA: Diagnosis not present

## 2019-12-19 DIAGNOSIS — K8000 Calculus of gallbladder with acute cholecystitis without obstruction: Secondary | ICD-10-CM | POA: Diagnosis not present

## 2019-12-19 DIAGNOSIS — R41841 Cognitive communication deficit: Secondary | ICD-10-CM | POA: Diagnosis not present

## 2019-12-19 DIAGNOSIS — R2681 Unsteadiness on feet: Secondary | ICD-10-CM | POA: Diagnosis not present

## 2019-12-19 DIAGNOSIS — R109 Unspecified abdominal pain: Secondary | ICD-10-CM | POA: Diagnosis not present

## 2019-12-19 DIAGNOSIS — R7401 Elevation of levels of liver transaminase levels: Secondary | ICD-10-CM | POA: Diagnosis not present

## 2019-12-19 DIAGNOSIS — R488 Other symbolic dysfunctions: Secondary | ICD-10-CM | POA: Diagnosis not present

## 2019-12-19 DIAGNOSIS — N281 Cyst of kidney, acquired: Secondary | ICD-10-CM | POA: Diagnosis not present

## 2019-12-19 DIAGNOSIS — R296 Repeated falls: Secondary | ICD-10-CM | POA: Diagnosis not present

## 2019-12-19 DIAGNOSIS — R278 Other lack of coordination: Secondary | ICD-10-CM | POA: Diagnosis not present

## 2019-12-20 ENCOUNTER — Non-Acute Institutional Stay: Payer: PPO | Admitting: Internal Medicine

## 2019-12-20 ENCOUNTER — Other Ambulatory Visit: Payer: Self-pay

## 2019-12-20 DIAGNOSIS — R1031 Right lower quadrant pain: Secondary | ICD-10-CM | POA: Diagnosis not present

## 2019-12-20 DIAGNOSIS — K8020 Calculus of gallbladder without cholecystitis without obstruction: Secondary | ICD-10-CM | POA: Diagnosis not present

## 2019-12-20 DIAGNOSIS — R278 Other lack of coordination: Secondary | ICD-10-CM | POA: Diagnosis not present

## 2019-12-20 DIAGNOSIS — M25561 Pain in right knee: Secondary | ICD-10-CM | POA: Diagnosis not present

## 2019-12-20 DIAGNOSIS — R296 Repeated falls: Secondary | ICD-10-CM | POA: Diagnosis not present

## 2019-12-20 DIAGNOSIS — N281 Cyst of kidney, acquired: Secondary | ICD-10-CM | POA: Diagnosis not present

## 2019-12-20 DIAGNOSIS — E876 Hypokalemia: Secondary | ICD-10-CM | POA: Diagnosis not present

## 2019-12-20 DIAGNOSIS — M6281 Muscle weakness (generalized): Secondary | ICD-10-CM | POA: Diagnosis not present

## 2019-12-20 DIAGNOSIS — Z515 Encounter for palliative care: Secondary | ICD-10-CM

## 2019-12-20 DIAGNOSIS — R1312 Dysphagia, oropharyngeal phase: Secondary | ICD-10-CM | POA: Diagnosis not present

## 2019-12-20 DIAGNOSIS — R488 Other symbolic dysfunctions: Secondary | ICD-10-CM | POA: Diagnosis not present

## 2019-12-20 DIAGNOSIS — R41841 Cognitive communication deficit: Secondary | ICD-10-CM | POA: Diagnosis not present

## 2019-12-20 DIAGNOSIS — B029 Zoster without complications: Secondary | ICD-10-CM | POA: Diagnosis not present

## 2019-12-20 DIAGNOSIS — R269 Unspecified abnormalities of gait and mobility: Secondary | ICD-10-CM

## 2019-12-20 DIAGNOSIS — R2681 Unsteadiness on feet: Secondary | ICD-10-CM | POA: Diagnosis not present

## 2019-12-21 DIAGNOSIS — R296 Repeated falls: Secondary | ICD-10-CM | POA: Diagnosis not present

## 2019-12-21 DIAGNOSIS — R488 Other symbolic dysfunctions: Secondary | ICD-10-CM | POA: Diagnosis not present

## 2019-12-21 DIAGNOSIS — R1312 Dysphagia, oropharyngeal phase: Secondary | ICD-10-CM | POA: Diagnosis not present

## 2019-12-21 DIAGNOSIS — R278 Other lack of coordination: Secondary | ICD-10-CM | POA: Diagnosis not present

## 2019-12-21 DIAGNOSIS — R41841 Cognitive communication deficit: Secondary | ICD-10-CM | POA: Diagnosis not present

## 2019-12-21 DIAGNOSIS — M6281 Muscle weakness (generalized): Secondary | ICD-10-CM | POA: Diagnosis not present

## 2019-12-21 DIAGNOSIS — R2681 Unsteadiness on feet: Secondary | ICD-10-CM | POA: Diagnosis not present

## 2019-12-22 DIAGNOSIS — R1312 Dysphagia, oropharyngeal phase: Secondary | ICD-10-CM | POA: Diagnosis not present

## 2019-12-22 DIAGNOSIS — M6281 Muscle weakness (generalized): Secondary | ICD-10-CM | POA: Diagnosis not present

## 2019-12-22 DIAGNOSIS — I509 Heart failure, unspecified: Secondary | ICD-10-CM | POA: Diagnosis not present

## 2019-12-22 DIAGNOSIS — K838 Other specified diseases of biliary tract: Secondary | ICD-10-CM | POA: Diagnosis not present

## 2019-12-22 DIAGNOSIS — R1011 Right upper quadrant pain: Secondary | ICD-10-CM | POA: Diagnosis not present

## 2019-12-22 DIAGNOSIS — R9431 Abnormal electrocardiogram [ECG] [EKG]: Secondary | ICD-10-CM | POA: Diagnosis not present

## 2019-12-22 DIAGNOSIS — R296 Repeated falls: Secondary | ICD-10-CM | POA: Diagnosis not present

## 2019-12-22 DIAGNOSIS — R488 Other symbolic dysfunctions: Secondary | ICD-10-CM | POA: Diagnosis not present

## 2019-12-22 DIAGNOSIS — K7689 Other specified diseases of liver: Secondary | ICD-10-CM | POA: Diagnosis not present

## 2019-12-22 DIAGNOSIS — R41841 Cognitive communication deficit: Secondary | ICD-10-CM | POA: Diagnosis not present

## 2019-12-22 DIAGNOSIS — K8020 Calculus of gallbladder without cholecystitis without obstruction: Secondary | ICD-10-CM | POA: Diagnosis not present

## 2019-12-22 DIAGNOSIS — R278 Other lack of coordination: Secondary | ICD-10-CM | POA: Diagnosis not present

## 2019-12-22 DIAGNOSIS — R2681 Unsteadiness on feet: Secondary | ICD-10-CM | POA: Diagnosis not present

## 2019-12-25 DIAGNOSIS — R41841 Cognitive communication deficit: Secondary | ICD-10-CM | POA: Diagnosis not present

## 2019-12-25 DIAGNOSIS — R296 Repeated falls: Secondary | ICD-10-CM | POA: Diagnosis not present

## 2019-12-25 DIAGNOSIS — R278 Other lack of coordination: Secondary | ICD-10-CM | POA: Diagnosis not present

## 2019-12-25 DIAGNOSIS — R1312 Dysphagia, oropharyngeal phase: Secondary | ICD-10-CM | POA: Diagnosis not present

## 2019-12-25 DIAGNOSIS — R2681 Unsteadiness on feet: Secondary | ICD-10-CM | POA: Diagnosis not present

## 2019-12-25 DIAGNOSIS — R488 Other symbolic dysfunctions: Secondary | ICD-10-CM | POA: Diagnosis not present

## 2019-12-25 DIAGNOSIS — M6281 Muscle weakness (generalized): Secondary | ICD-10-CM | POA: Diagnosis not present

## 2019-12-25 NOTE — Progress Notes (Signed)
[facility] Consult Note Telephone: [phone]  Fax: [phone]  PATIENT NAME: Jade Juarez DOB: 1926-09-25 MRN: [id number]  PRIMARY CARE PROVIDER:   Dr. Ezequiel Kayser  REFERRING PROVIDER: Dr. Ezequiel Kayser   RESPONSIBLE PARTY:      Jade Juarez Relative [phone]   DAUGHTER in law    RECOMMENDATIONS and PLAN:  Palliative care encounter  Z51.5   1.  Advance care planning:  Explanation of palliative care to patient daughter in law Jade Juarez and son (present via video call). Pt's primary goal is to resume ambulation, minimize R knee pain and live and extended life with assistance from  family and staff members at [facility].  Advanced directives were also reviewed with delegations of DNR/DNI, comfort measures without return to the hospital antibiotics if indicated and no feeding tube.  POA will sign MOST form during her next visit with patient.  Palliative care will continue to f/u with patient.   2.  R knee pain:  Encouraged staff to administer Tylenol 500 mg every 6 hours as previously ordered.  Xray as ordered by Dr. Keturah Barre to evaluate pathology.  Additional treatment plans pending per xray result and Uric acid lab value.   3.  Alteration of gait:  Very high fall risk and complicated by R knee pain.  Continue PT/OT as tolerated.  Fall risk prevention from family and staff. Reminders to patient to call for asaistance.   4.  Constipation:  Improve hydration and dietary fiber.  Prune juice and hot beverage every other day. Continue Senna Plus 2 tabs daily.  Increase Senna Plus to 2 tabs twice daily if no improvement with scheduled prune juice addition.  Continue to monitor.   5.  Cognitive deficits:  Related to vascular ischemia and previous CVA:  At baseline.  FAST stage 6E with difficulty of ambulation.  Continue Aricept 5mg  at HS.  Monitor for changes of status.   6.  L chest alteration of skin:  Unsure of cause.  Begin therapy for potential  shingles as recommended by Dr. Keturah Barre.  Continue to monitor skin integrity for sign of improvement or worsening of condition.    I spent 60 minutes providing this consultation,  from 1330 to 1430. More than 50% of the time in this consultation was spent coordinating communication with patient, DIL/POA Jade Juarez and son Jade Juarez.  Dr. Durwin Reges was also in attendance for a portion of the visit.   HISTORY OF PRESENT ILLNESS:  Jade Juarez is a 86 y.o. year old female with multiple medical problems including previous CVA with alteration of ambulation, cognitive deficits related to vascular ischemia and previous CVA, new onset of R knee pain. Multiple visits to the ER related to falls, constipation and nausea.  It has been found that she has cholilathiasis. Clinical staff reports increased difficulty with ambulation, rash of L chest and pt reports increased pain of her R knee.   Palliative Care was asked to help address goals of care.   CODE STATUS: DNR/DNI  PPS: 30% 2 person assist for transfers HOSPICE ELIGIBILITY/DIAGNOSIS: TBD  PAST MEDICAL HISTORY:  Past Medical History:  Diagnosis Date  . Anemia    takes Ferrous Sulfate daily  . Anxiety   . Arthritis    right knees  . Atrial fibrillation ([place])    prescribed Eliquis but hasn't started-waiting until after this procedure per [place]  . Depression    takes Zoloft daily  . Diverticulitis    hx of  . Frequent UTI   .  GERD (gastroesophageal reflux disease)    hx of-yrs ago  . Headache    occasionally  . Heart murmur   . High cholesterol    takes Atorvastatin daily  . History of bronchitis    about a yr ago  . History of colon polyps   . Hypertension    takes Diltiazem and Diovan daily  . Hypothyroidism    takes Synthroid daily  . IBS (irritable bowel syndrome)    PMH  . Incontinence   . Joint pain   . Joint swelling   . Nocturia   . Overactive bladder   . PONV (postoperative nausea and vomiting)   . Presence of permanent cardiac  pacemaker   . Restless leg syndrome   . Status post placement of implantable loop recorder 05/2014  . Stool incontinence    at times  . Stroke [facility])    takes Plavix daily-right sided weakness  . Vertebrobasilar artery insufficiency   . Wears glasses     SOCIAL HX:  Social History   Tobacco Use  . Smoking status: Never Smoker  . Smokeless tobacco: Never Used  Substance Use Topics  . Alcohol use: No    ALLERGIES:  Allergies  Allergen Reactions  . Hibiclens [Chlorhexidine Gluconate]     vomiting  . Benicar [Olmesartan] Other (See Comments)    Headache  . Codeine Nausea Only  . Erythromycin Nausea Only  . Lipitor [Atorvastatin] Other (See Comments)    Myalgia  . Other Nausea Only and Other (See Comments)    Entex ER OTC Cough Medicines - Causes confusion / disorientation   . Tetracyclines & Related Nausea Only  . Vibramycin [Doxycycline] Nausea Only  . Vicodin [Hydrocodone-Acetaminophen] Nausea Only     PERTINENT MEDICATIONS:  Outpatient Encounter Medications as of 12/20/2019  Medication Sig  . acetaminophen (TYLENOL) 500 MG tablet Take 500 mg by mouth every 8 (eight) hours as needed for moderate pain.  Marland Kitchen amiodarone (PACERONE) 200 MG tablet Take 200 mg by mouth 2 (two) times daily.  Marland Kitchen apixaban (ELIQUIS) 2.5 MG TABS tablet Take 2.5 mg by mouth 2 (two) times daily.  Marland Kitchen aspirin 81 MG chewable tablet Chew 81 mg by mouth daily.  Marland Kitchen diltiazem (CARDIZEM SR) 120 MG 12 hr capsule Take 120 mg by mouth 2 (two) times daily.   Marland Kitchen donepezil (ARICEPT) 5 MG tablet Take 5 mg by mouth at bedtime.  Marland Kitchen ezetimibe (ZETIA) 10 MG tablet Take 10 mg by mouth daily.  . ferrous sulfate 325 (65 FE) MG tablet Take 325 mg by mouth 3 (three) times daily with meals.   . gabapentin (NEURONTIN) 100 MG capsule Take 100 mg by mouth at bedtime.   Marland Kitchen glycerin adult 2 g suppository Place 1 suppository rectally as needed for constipation.  . hydrochlorothiazide (HYDRODIURIL) 25 MG tablet Take 25 mg by mouth daily.    Marland Kitchen levofloxacin (LEVAQUIN) 750 MG tablet Take 1 tablet (750 mg total) by mouth daily.  Marland Kitchen levothyroxine (SYNTHROID) 100 MCG tablet Take 100 mcg by mouth daily.  Marland Kitchen losartan (COZAAR) 100 MG tablet Take 100 mg by mouth daily.  . Magnesium 400 MG TABS Take 1 tablet by mouth daily.  . montelukast (SINGULAIR) 10 MG tablet Take 10 mg by mouth daily.   . Multiple Vitamin (MULTIVITAMIN) tablet Take 1 tablet by mouth daily.  . Nutritional Supplements (EQUATE PO) Take 1 tablet by mouth daily as needed (gerd).  Marland Kitchen omeprazole (PRILOSEC) 40 MG capsule Take 40 mg by mouth daily.  Marland Kitchen  ondansetron (ZOFRAN ODT) 4 MG disintegrating tablet Take 1 tablet (4 mg total) by mouth every 8 (eight) hours as needed for nausea or vomiting. (Patient not taking: Reported on 11/22/2019)  . polyethylene glycol (MIRALAX / GLYCOLAX) 17 g packet Take 17 g by mouth daily.  . promethazine (PHENERGAN) 12.5 MG suppository Place 1 suppository (12.5 mg total) rectally every 6 (six) hours as needed for nausea or vomiting.  . sertraline (ZOLOFT) 50 MG tablet Take 50 mg by mouth daily.   Facility-Administered Encounter Medications as of 12/20/2019  Medication  . 0.9 %  sodium chloride infusion    PHYSICAL EXAM:   General: NAD,well nourished elderly female in recliner Cardiovascular: regular rate and rhythm Pulmonary: clear ant fields Abdomen: soft, nontender, + bowel sounds GU: no suprapubic tenderness Extremities: Tenderness and medial edema of the R knee Skin: hyperpigmentation and blistering of the L anterio/lateral chest and breast, nontender Neurological: A&O x3, Weakness.  Fluid speech Psych:  Pleasant mood, cooperative  Gonzella Lex, NP-C

## 2019-12-26 DIAGNOSIS — R41841 Cognitive communication deficit: Secondary | ICD-10-CM | POA: Diagnosis not present

## 2019-12-26 DIAGNOSIS — R278 Other lack of coordination: Secondary | ICD-10-CM | POA: Diagnosis not present

## 2019-12-26 DIAGNOSIS — R296 Repeated falls: Secondary | ICD-10-CM | POA: Diagnosis not present

## 2019-12-26 DIAGNOSIS — R2681 Unsteadiness on feet: Secondary | ICD-10-CM | POA: Diagnosis not present

## 2019-12-26 DIAGNOSIS — R1312 Dysphagia, oropharyngeal phase: Secondary | ICD-10-CM | POA: Diagnosis not present

## 2019-12-26 DIAGNOSIS — R488 Other symbolic dysfunctions: Secondary | ICD-10-CM | POA: Diagnosis not present

## 2019-12-26 DIAGNOSIS — M6281 Muscle weakness (generalized): Secondary | ICD-10-CM | POA: Diagnosis not present

## 2019-12-27 DIAGNOSIS — E876 Hypokalemia: Secondary | ICD-10-CM | POA: Diagnosis not present

## 2019-12-27 DIAGNOSIS — R160 Hepatomegaly, not elsewhere classified: Secondary | ICD-10-CM | POA: Diagnosis not present

## 2019-12-27 DIAGNOSIS — K8020 Calculus of gallbladder without cholecystitis without obstruction: Secondary | ICD-10-CM | POA: Diagnosis not present

## 2019-12-27 DIAGNOSIS — R41841 Cognitive communication deficit: Secondary | ICD-10-CM | POA: Diagnosis not present

## 2019-12-27 DIAGNOSIS — I272 Pulmonary hypertension, unspecified: Secondary | ICD-10-CM | POA: Diagnosis not present

## 2019-12-27 DIAGNOSIS — R296 Repeated falls: Secondary | ICD-10-CM | POA: Diagnosis not present

## 2019-12-27 DIAGNOSIS — R2681 Unsteadiness on feet: Secondary | ICD-10-CM | POA: Diagnosis not present

## 2019-12-27 DIAGNOSIS — B029 Zoster without complications: Secondary | ICD-10-CM | POA: Diagnosis not present

## 2019-12-27 DIAGNOSIS — R488 Other symbolic dysfunctions: Secondary | ICD-10-CM | POA: Diagnosis not present

## 2019-12-27 DIAGNOSIS — R278 Other lack of coordination: Secondary | ICD-10-CM | POA: Diagnosis not present

## 2019-12-27 DIAGNOSIS — R1312 Dysphagia, oropharyngeal phase: Secondary | ICD-10-CM | POA: Diagnosis not present

## 2019-12-27 DIAGNOSIS — M1711 Unilateral primary osteoarthritis, right knee: Secondary | ICD-10-CM | POA: Diagnosis not present

## 2019-12-27 DIAGNOSIS — I129 Hypertensive chronic kidney disease with stage 1 through stage 4 chronic kidney disease, or unspecified chronic kidney disease: Secondary | ICD-10-CM | POA: Diagnosis not present

## 2019-12-27 DIAGNOSIS — M6281 Muscle weakness (generalized): Secondary | ICD-10-CM | POA: Diagnosis not present

## 2019-12-28 DIAGNOSIS — R278 Other lack of coordination: Secondary | ICD-10-CM | POA: Diagnosis not present

## 2019-12-28 DIAGNOSIS — M6281 Muscle weakness (generalized): Secondary | ICD-10-CM | POA: Diagnosis not present

## 2019-12-28 DIAGNOSIS — R488 Other symbolic dysfunctions: Secondary | ICD-10-CM | POA: Diagnosis not present

## 2019-12-28 DIAGNOSIS — R2681 Unsteadiness on feet: Secondary | ICD-10-CM | POA: Diagnosis not present

## 2019-12-28 DIAGNOSIS — R1312 Dysphagia, oropharyngeal phase: Secondary | ICD-10-CM | POA: Diagnosis not present

## 2019-12-28 DIAGNOSIS — R296 Repeated falls: Secondary | ICD-10-CM | POA: Diagnosis not present

## 2019-12-28 DIAGNOSIS — R41841 Cognitive communication deficit: Secondary | ICD-10-CM | POA: Diagnosis not present

## 2019-12-29 DIAGNOSIS — R296 Repeated falls: Secondary | ICD-10-CM | POA: Diagnosis not present

## 2019-12-29 DIAGNOSIS — R2681 Unsteadiness on feet: Secondary | ICD-10-CM | POA: Diagnosis not present

## 2019-12-29 DIAGNOSIS — R488 Other symbolic dysfunctions: Secondary | ICD-10-CM | POA: Diagnosis not present

## 2019-12-29 DIAGNOSIS — R41841 Cognitive communication deficit: Secondary | ICD-10-CM | POA: Diagnosis not present

## 2019-12-29 DIAGNOSIS — R1312 Dysphagia, oropharyngeal phase: Secondary | ICD-10-CM | POA: Diagnosis not present

## 2019-12-29 DIAGNOSIS — R278 Other lack of coordination: Secondary | ICD-10-CM | POA: Diagnosis not present

## 2019-12-29 DIAGNOSIS — M6281 Muscle weakness (generalized): Secondary | ICD-10-CM | POA: Diagnosis not present

## 2020-01-01 DIAGNOSIS — M6281 Muscle weakness (generalized): Secondary | ICD-10-CM | POA: Diagnosis not present

## 2020-01-01 DIAGNOSIS — R1312 Dysphagia, oropharyngeal phase: Secondary | ICD-10-CM | POA: Diagnosis not present

## 2020-01-01 DIAGNOSIS — R296 Repeated falls: Secondary | ICD-10-CM | POA: Diagnosis not present

## 2020-01-01 DIAGNOSIS — R41841 Cognitive communication deficit: Secondary | ICD-10-CM | POA: Diagnosis not present

## 2020-01-01 DIAGNOSIS — R488 Other symbolic dysfunctions: Secondary | ICD-10-CM | POA: Diagnosis not present

## 2020-01-01 DIAGNOSIS — R278 Other lack of coordination: Secondary | ICD-10-CM | POA: Diagnosis not present

## 2020-01-01 DIAGNOSIS — R2681 Unsteadiness on feet: Secondary | ICD-10-CM | POA: Diagnosis not present

## 2020-01-02 ENCOUNTER — Ambulatory Visit: Payer: PPO | Admitting: Nurse Practitioner

## 2020-01-03 DIAGNOSIS — R278 Other lack of coordination: Secondary | ICD-10-CM | POA: Diagnosis not present

## 2020-01-03 DIAGNOSIS — R488 Other symbolic dysfunctions: Secondary | ICD-10-CM | POA: Diagnosis not present

## 2020-01-03 DIAGNOSIS — R238 Other skin changes: Secondary | ICD-10-CM | POA: Diagnosis not present

## 2020-01-03 DIAGNOSIS — E876 Hypokalemia: Secondary | ICD-10-CM | POA: Diagnosis not present

## 2020-01-03 DIAGNOSIS — E8809 Other disorders of plasma-protein metabolism, not elsewhere classified: Secondary | ICD-10-CM | POA: Diagnosis not present

## 2020-01-03 DIAGNOSIS — D7589 Other specified diseases of blood and blood-forming organs: Secondary | ICD-10-CM | POA: Diagnosis not present

## 2020-01-03 DIAGNOSIS — R2681 Unsteadiness on feet: Secondary | ICD-10-CM | POA: Diagnosis not present

## 2020-01-03 DIAGNOSIS — Z79899 Other long term (current) drug therapy: Secondary | ICD-10-CM | POA: Diagnosis not present

## 2020-01-03 DIAGNOSIS — R229 Localized swelling, mass and lump, unspecified: Secondary | ICD-10-CM | POA: Diagnosis not present

## 2020-01-03 DIAGNOSIS — R296 Repeated falls: Secondary | ICD-10-CM | POA: Diagnosis not present

## 2020-01-03 DIAGNOSIS — D631 Anemia in chronic kidney disease: Secondary | ICD-10-CM | POA: Diagnosis not present

## 2020-01-03 DIAGNOSIS — R7401 Elevation of levels of liver transaminase levels: Secondary | ICD-10-CM | POA: Diagnosis not present

## 2020-01-03 DIAGNOSIS — M6281 Muscle weakness (generalized): Secondary | ICD-10-CM | POA: Diagnosis not present

## 2020-01-04 DIAGNOSIS — R278 Other lack of coordination: Secondary | ICD-10-CM | POA: Diagnosis not present

## 2020-01-04 DIAGNOSIS — R488 Other symbolic dysfunctions: Secondary | ICD-10-CM | POA: Diagnosis not present

## 2020-01-04 DIAGNOSIS — R296 Repeated falls: Secondary | ICD-10-CM | POA: Diagnosis not present

## 2020-01-04 DIAGNOSIS — M6281 Muscle weakness (generalized): Secondary | ICD-10-CM | POA: Diagnosis not present

## 2020-01-04 DIAGNOSIS — R2681 Unsteadiness on feet: Secondary | ICD-10-CM | POA: Diagnosis not present

## 2020-01-04 DIAGNOSIS — R229 Localized swelling, mass and lump, unspecified: Secondary | ICD-10-CM | POA: Diagnosis not present

## 2020-01-05 DIAGNOSIS — R296 Repeated falls: Secondary | ICD-10-CM | POA: Diagnosis not present

## 2020-01-05 DIAGNOSIS — R2681 Unsteadiness on feet: Secondary | ICD-10-CM | POA: Diagnosis not present

## 2020-01-05 DIAGNOSIS — M6281 Muscle weakness (generalized): Secondary | ICD-10-CM | POA: Diagnosis not present

## 2020-01-05 DIAGNOSIS — R488 Other symbolic dysfunctions: Secondary | ICD-10-CM | POA: Diagnosis not present

## 2020-01-05 DIAGNOSIS — R278 Other lack of coordination: Secondary | ICD-10-CM | POA: Diagnosis not present

## 2020-01-08 ENCOUNTER — Non-Acute Institutional Stay: Payer: PPO | Admitting: Internal Medicine

## 2020-01-08 DIAGNOSIS — R29898 Other symptoms and signs involving the musculoskeletal system: Secondary | ICD-10-CM | POA: Diagnosis not present

## 2020-01-08 DIAGNOSIS — K5900 Constipation, unspecified: Secondary | ICD-10-CM

## 2020-01-08 DIAGNOSIS — Z515 Encounter for palliative care: Secondary | ICD-10-CM

## 2020-01-08 DIAGNOSIS — R2681 Unsteadiness on feet: Secondary | ICD-10-CM | POA: Diagnosis not present

## 2020-01-08 DIAGNOSIS — R296 Repeated falls: Secondary | ICD-10-CM | POA: Diagnosis not present

## 2020-01-08 DIAGNOSIS — M6281 Muscle weakness (generalized): Secondary | ICD-10-CM | POA: Diagnosis not present

## 2020-01-08 DIAGNOSIS — R278 Other lack of coordination: Secondary | ICD-10-CM | POA: Diagnosis not present

## 2020-01-08 DIAGNOSIS — R488 Other symbolic dysfunctions: Secondary | ICD-10-CM | POA: Diagnosis not present

## 2020-01-08 NOTE — Progress Notes (Signed)
[place] Consult Note Telephone: [phone]  Fax: [phone]  PATIENT NAME: Jade Juarez DOB: January 17, 1927 MRN: [id number]  PRIMARY CARE PROVIDER:   Dr. Ezequiel Kayser  REFERRING PROVIDER: Dr. Ezequiel Kayser   RESPONSIBLE PARTY:      Alcon,Judy Relative [phone]   DAUGHTER in law    RECOMMENDATIONS and PLAN:  Palliative care encounter  Z51.5   1.  Advance care planning:  DNAR/DNI with comfort measures without return to the hospital, antibiotics if indicated and no feeding tube.   Palliative care will continue to f/u with patient.   2.  R knee pain:  Chronic. Tylenol 500 mg every 6 hours as previously ordered.  Continue physical therapy.  3.  Alteration of gait:  Unchanged.  Continue PT/OT as tolerated. Assist with mobility as needed.  Fall risk prevention from family and staff.  4.  Constipation: No Improvement. Milk of Magnesia 30cc PO now.  Increase Senna Plus to 2 tablets twice daily. Continue to improve hydration and dietary fiber.  Continue to monitor.   5.  Cognitive deficits:  Related to vascular ischemia and previous CVA:  At baseline.  FAST stage 6E with difficulty of ambulation.  Continue Aricept 5mg  at HS.  Monitor for changes of status.     I spent 45 minutes providing this consultation,  from 1600 to 1645. More than 50% of the time in this consultation was spent coordinating communication with patient and [facility].Marland Kitchen   HISTORY OF PRESENT ILLNESS:  Follow-up with Clista Bernhardt, a 86 y.o. year old female with multiple medical problems including previous CVA with alteration of ambulation, cognitive deficits related to vascular ischemia and previous CVA, Clinical staff reports increased weakness of lower extremities causing great difficulty with mobilizations and transfers.  Reports also of significant constipation.  She continues to feed self and weight is stable.  Palliative Care was asked to help address goals of  care.   CODE STATUS: DNR/DNI  PPS: 30% 2 person assist for transfers HOSPICE ELIGIBILITY/DIAGNOSIS: TBD  PAST MEDICAL HISTORY:  Past Medical History:  Diagnosis Date  . Anemia    takes Ferrous Sulfate daily  . Anxiety   . Arthritis    right knees  . Atrial fibrillation ([place])    prescribed Eliquis but hasn't started-waiting until after this procedure per [place]  . Depression    takes Zoloft daily  . Diverticulitis    hx of  . Frequent UTI   . GERD (gastroesophageal reflux disease)    hx of-yrs ago  . Headache    occasionally  . Heart murmur   . High cholesterol    takes Atorvastatin daily  . History of bronchitis    about a yr ago  . History of colon polyps   . Hypertension    takes Diltiazem and Diovan daily  . Hypothyroidism    takes Synthroid daily  . IBS (irritable bowel syndrome)    PMH  . Incontinence   . Joint pain   . Joint swelling   . Nocturia   . Overactive bladder   . PONV (postoperative nausea and vomiting)   . Presence of permanent cardiac pacemaker   . Restless leg syndrome   . Status post placement of implantable loop recorder 05/2014  . Stool incontinence    at times  . Stroke [facility])    takes Plavix daily-right sided weakness  . Vertebrobasilar artery insufficiency   . Wears glasses      ALLERGIES:  Allergies  Allergen Reactions  . Hibiclens [Chlorhexidine Gluconate]     vomiting  . Benicar [Olmesartan] Other (See Comments)    Headache  . Codeine Nausea Only  . Erythromycin Nausea Only  . Lipitor [Atorvastatin] Other (See Comments)    Myalgia  . Other Nausea Only and Other (See Comments)    Entex ER OTC Cough Medicines - Causes confusion / disorientation   . Tetracyclines & Related Nausea Only  . Vibramycin [Doxycycline] Nausea Only  . Vicodin [Hydrocodone-Acetaminophen] Nausea Only     PERTINENT MEDICATIONS:  Outpatient Encounter Medications as of 12/20/2019  Medication Sig  . acetaminophen (TYLENOL) 500 MG tablet Take 500 mg  by mouth every 8 (eight) hours as needed for moderate pain.  Marland Kitchen amiodarone (PACERONE) 200 MG tablet Take 200 mg by mouth 2 (two) times daily.  Marland Kitchen apixaban (ELIQUIS) 2.5 MG TABS tablet Take 2.5 mg by mouth 2 (two) times daily.  Marland Kitchen aspirin 81 MG chewable tablet Chew 81 mg by mouth daily.  Marland Kitchen diltiazem (CARDIZEM SR) 120 MG 12 hr capsule Take 120 mg by mouth 2 (two) times daily.   Marland Kitchen donepezil (ARICEPT) 5 MG tablet Take 5 mg by mouth at bedtime.  Marland Kitchen ezetimibe (ZETIA) 10 MG tablet Take 10 mg by mouth daily.  . ferrous sulfate 325 (65 FE) MG tablet Take 325 mg by mouth 3 (three) times daily with meals.   . gabapentin (NEURONTIN) 100 MG capsule Take 100 mg by mouth at bedtime.   Marland Kitchen glycerin adult 2 g suppository Place 1 suppository rectally as needed for constipation.  . hydrochlorothiazide (HYDRODIURIL) 25 MG tablet Take 25 mg by mouth daily.  Marland Kitchen levofloxacin (LEVAQUIN) 750 MG tablet Take 1 tablet (750 mg total) by mouth daily.  Marland Kitchen levothyroxine (SYNTHROID) 100 MCG tablet Take 100 mcg by mouth daily.  Marland Kitchen losartan (COZAAR) 100 MG tablet Take 100 mg by mouth daily.  . Magnesium 400 MG TABS Take 1 tablet by mouth daily.  . montelukast (SINGULAIR) 10 MG tablet Take 10 mg by mouth daily.   . Multiple Vitamin (MULTIVITAMIN) tablet Take 1 tablet by mouth daily.  . Nutritional Supplements (EQUATE PO) Take 1 tablet by mouth daily as needed (gerd).  Marland Kitchen omeprazole (PRILOSEC) 40 MG capsule Take 40 mg by mouth daily.  . ondansetron (ZOFRAN ODT) 4 MG disintegrating tablet Take 1 tablet (4 mg total) by mouth every 8 (eight) hours as needed for nausea or vomiting. (Patient not taking: Reported on 11/22/2019)  . polyethylene glycol (MIRALAX / GLYCOLAX) 17 g packet Take 17 g by mouth daily.  . promethazine (PHENERGAN) 12.5 MG suppository Place 1 suppository (12.5 mg total) rectally every 6 (six) hours as needed for nausea or vomiting.  . sertraline (ZOLOFT) 50 MG tablet Take 50 mg by mouth daily.     PHYSICAL EXAM:    General: NAD,well nourished elderly female in wheelchair Cardiovascular: regular rate and rhythm Pulmonary: clear all fields Abdomen: soft, nontender, + bowel sounds GU: no suprapubic tenderness Extremities: 1+ edema R ankle Skin: exposed skin is intact Neurological: A&O x3, Weakness.  Fluid speech Psych:  Pleasant mood, cooperative  Gonzella Lex, NP-C

## 2020-01-09 ENCOUNTER — Other Ambulatory Visit: Payer: Self-pay

## 2020-01-09 DIAGNOSIS — R296 Repeated falls: Secondary | ICD-10-CM | POA: Diagnosis not present

## 2020-01-09 DIAGNOSIS — R278 Other lack of coordination: Secondary | ICD-10-CM | POA: Diagnosis not present

## 2020-01-09 DIAGNOSIS — M6281 Muscle weakness (generalized): Secondary | ICD-10-CM | POA: Diagnosis not present

## 2020-01-09 DIAGNOSIS — R2681 Unsteadiness on feet: Secondary | ICD-10-CM | POA: Diagnosis not present

## 2020-01-09 DIAGNOSIS — R488 Other symbolic dysfunctions: Secondary | ICD-10-CM | POA: Diagnosis not present

## 2020-01-10 DIAGNOSIS — R488 Other symbolic dysfunctions: Secondary | ICD-10-CM | POA: Diagnosis not present

## 2020-01-10 DIAGNOSIS — R935 Abnormal findings on diagnostic imaging of other abdominal regions, including retroperitoneum: Secondary | ICD-10-CM | POA: Diagnosis not present

## 2020-01-10 DIAGNOSIS — R296 Repeated falls: Secondary | ICD-10-CM | POA: Diagnosis not present

## 2020-01-10 DIAGNOSIS — R8271 Bacteriuria: Secondary | ICD-10-CM | POA: Diagnosis not present

## 2020-01-10 DIAGNOSIS — R2681 Unsteadiness on feet: Secondary | ICD-10-CM | POA: Diagnosis not present

## 2020-01-10 DIAGNOSIS — M6281 Muscle weakness (generalized): Secondary | ICD-10-CM | POA: Diagnosis not present

## 2020-01-10 DIAGNOSIS — N3281 Overactive bladder: Secondary | ICD-10-CM | POA: Diagnosis not present

## 2020-01-10 DIAGNOSIS — R278 Other lack of coordination: Secondary | ICD-10-CM | POA: Diagnosis not present

## 2020-01-11 DIAGNOSIS — R2681 Unsteadiness on feet: Secondary | ICD-10-CM | POA: Diagnosis not present

## 2020-01-11 DIAGNOSIS — R296 Repeated falls: Secondary | ICD-10-CM | POA: Diagnosis not present

## 2020-01-11 DIAGNOSIS — R278 Other lack of coordination: Secondary | ICD-10-CM | POA: Diagnosis not present

## 2020-01-11 DIAGNOSIS — R488 Other symbolic dysfunctions: Secondary | ICD-10-CM | POA: Diagnosis not present

## 2020-01-11 DIAGNOSIS — M6281 Muscle weakness (generalized): Secondary | ICD-10-CM | POA: Diagnosis not present

## 2020-01-13 DIAGNOSIS — R296 Repeated falls: Secondary | ICD-10-CM | POA: Diagnosis not present

## 2020-01-13 DIAGNOSIS — I651 Occlusion and stenosis of basilar artery: Secondary | ICD-10-CM | POA: Diagnosis not present

## 2020-01-13 DIAGNOSIS — R32 Unspecified urinary incontinence: Secondary | ICD-10-CM | POA: Diagnosis not present

## 2020-01-13 DIAGNOSIS — R269 Unspecified abnormalities of gait and mobility: Secondary | ICD-10-CM | POA: Diagnosis not present

## 2020-01-15 DIAGNOSIS — Z79899 Other long term (current) drug therapy: Secondary | ICD-10-CM | POA: Diagnosis not present

## 2020-01-15 DIAGNOSIS — N390 Urinary tract infection, site not specified: Secondary | ICD-10-CM | POA: Diagnosis not present

## 2020-01-16 ENCOUNTER — Ambulatory Visit: Payer: PPO | Admitting: Gastroenterology

## 2020-01-16 DIAGNOSIS — R296 Repeated falls: Secondary | ICD-10-CM | POA: Diagnosis not present

## 2020-01-16 DIAGNOSIS — R278 Other lack of coordination: Secondary | ICD-10-CM | POA: Diagnosis not present

## 2020-01-16 DIAGNOSIS — R2681 Unsteadiness on feet: Secondary | ICD-10-CM | POA: Diagnosis not present

## 2020-01-16 DIAGNOSIS — R488 Other symbolic dysfunctions: Secondary | ICD-10-CM | POA: Diagnosis not present

## 2020-01-16 DIAGNOSIS — M6281 Muscle weakness (generalized): Secondary | ICD-10-CM | POA: Diagnosis not present

## 2020-01-17 DIAGNOSIS — R488 Other symbolic dysfunctions: Secondary | ICD-10-CM | POA: Diagnosis not present

## 2020-01-17 DIAGNOSIS — N1831 Chronic kidney disease, stage 3a: Secondary | ICD-10-CM | POA: Diagnosis not present

## 2020-01-17 DIAGNOSIS — E876 Hypokalemia: Secondary | ICD-10-CM | POA: Diagnosis not present

## 2020-01-17 DIAGNOSIS — R229 Localized swelling, mass and lump, unspecified: Secondary | ICD-10-CM | POA: Diagnosis not present

## 2020-01-17 DIAGNOSIS — R2681 Unsteadiness on feet: Secondary | ICD-10-CM | POA: Diagnosis not present

## 2020-01-17 DIAGNOSIS — M6281 Muscle weakness (generalized): Secondary | ICD-10-CM | POA: Diagnosis not present

## 2020-01-17 DIAGNOSIS — R2232 Localized swelling, mass and lump, left upper limb: Secondary | ICD-10-CM | POA: Diagnosis not present

## 2020-01-17 DIAGNOSIS — R296 Repeated falls: Secondary | ICD-10-CM | POA: Diagnosis not present

## 2020-01-17 DIAGNOSIS — R278 Other lack of coordination: Secondary | ICD-10-CM | POA: Diagnosis not present

## 2020-01-18 DIAGNOSIS — R296 Repeated falls: Secondary | ICD-10-CM | POA: Diagnosis not present

## 2020-01-18 DIAGNOSIS — E876 Hypokalemia: Secondary | ICD-10-CM | POA: Diagnosis not present

## 2020-01-18 DIAGNOSIS — N1831 Chronic kidney disease, stage 3a: Secondary | ICD-10-CM | POA: Diagnosis not present

## 2020-01-18 DIAGNOSIS — R229 Localized swelling, mass and lump, unspecified: Secondary | ICD-10-CM | POA: Diagnosis not present

## 2020-01-18 DIAGNOSIS — R2232 Localized swelling, mass and lump, left upper limb: Secondary | ICD-10-CM | POA: Diagnosis not present

## 2020-01-18 DIAGNOSIS — R2681 Unsteadiness on feet: Secondary | ICD-10-CM | POA: Diagnosis not present

## 2020-01-18 DIAGNOSIS — R278 Other lack of coordination: Secondary | ICD-10-CM | POA: Diagnosis not present

## 2020-01-18 DIAGNOSIS — R488 Other symbolic dysfunctions: Secondary | ICD-10-CM | POA: Diagnosis not present

## 2020-01-18 DIAGNOSIS — M6281 Muscle weakness (generalized): Secondary | ICD-10-CM | POA: Diagnosis not present

## 2020-01-18 DIAGNOSIS — R7401 Elevation of levels of liver transaminase levels: Secondary | ICD-10-CM | POA: Diagnosis not present

## 2020-01-19 DIAGNOSIS — M6281 Muscle weakness (generalized): Secondary | ICD-10-CM | POA: Diagnosis not present

## 2020-01-19 DIAGNOSIS — R278 Other lack of coordination: Secondary | ICD-10-CM | POA: Diagnosis not present

## 2020-01-19 DIAGNOSIS — R2681 Unsteadiness on feet: Secondary | ICD-10-CM | POA: Diagnosis not present

## 2020-01-19 DIAGNOSIS — R296 Repeated falls: Secondary | ICD-10-CM | POA: Diagnosis not present

## 2020-01-19 DIAGNOSIS — R488 Other symbolic dysfunctions: Secondary | ICD-10-CM | POA: Diagnosis not present

## 2020-01-20 ENCOUNTER — Emergency Department (HOSPITAL_COMMUNITY)
Admission: EM | Admit: 2020-01-20 | Discharge: 2020-01-20 | Disposition: A | Discharge status: Home / Self Care | Payer: PPO | Attending: Emergency Medicine | Admitting: Emergency Medicine

## 2020-01-20 ENCOUNTER — Other Ambulatory Visit: Payer: Self-pay

## 2020-01-20 ENCOUNTER — Encounter (HOSPITAL_COMMUNITY): Payer: Self-pay

## 2020-01-20 ENCOUNTER — Emergency Department (HOSPITAL_COMMUNITY): Payer: PPO

## 2020-01-20 DIAGNOSIS — R2681 Unsteadiness on feet: Secondary | ICD-10-CM | POA: Diagnosis not present

## 2020-01-20 DIAGNOSIS — Y999 Unspecified external cause status: Secondary | ICD-10-CM | POA: Diagnosis not present

## 2020-01-20 DIAGNOSIS — R488 Other symbolic dysfunctions: Secondary | ICD-10-CM | POA: Diagnosis not present

## 2020-01-20 DIAGNOSIS — R296 Repeated falls: Secondary | ICD-10-CM | POA: Diagnosis not present

## 2020-01-20 DIAGNOSIS — M47812 Spondylosis without myelopathy or radiculopathy, cervical region: Secondary | ICD-10-CM | POA: Diagnosis not present

## 2020-01-20 DIAGNOSIS — M5021 Other cervical disc displacement,  high cervical region: Secondary | ICD-10-CM | POA: Diagnosis not present

## 2020-01-20 DIAGNOSIS — Z7901 Long term (current) use of anticoagulants: Secondary | ICD-10-CM | POA: Diagnosis not present

## 2020-01-20 DIAGNOSIS — G9389 Other specified disorders of brain: Secondary | ICD-10-CM | POA: Diagnosis not present

## 2020-01-20 DIAGNOSIS — Z79899 Other long term (current) drug therapy: Secondary | ICD-10-CM | POA: Diagnosis not present

## 2020-01-20 DIAGNOSIS — R278 Other lack of coordination: Secondary | ICD-10-CM | POA: Diagnosis not present

## 2020-01-20 DIAGNOSIS — W050XXA Fall from non-moving wheelchair, initial encounter: Secondary | ICD-10-CM | POA: Insufficient documentation

## 2020-01-20 DIAGNOSIS — S0990XA Unspecified injury of head, initial encounter: Secondary | ICD-10-CM | POA: Diagnosis present

## 2020-01-20 DIAGNOSIS — Y9389 Activity, other specified: Secondary | ICD-10-CM | POA: Diagnosis not present

## 2020-01-20 DIAGNOSIS — I10 Essential (primary) hypertension: Secondary | ICD-10-CM | POA: Diagnosis not present

## 2020-01-20 DIAGNOSIS — Y92122 Bedroom in nursing home as the place of occurrence of the external cause: Secondary | ICD-10-CM | POA: Diagnosis not present

## 2020-01-20 DIAGNOSIS — W19XXXA Unspecified fall, initial encounter: Secondary | ICD-10-CM

## 2020-01-20 DIAGNOSIS — S0083XA Contusion of other part of head, initial encounter: Secondary | ICD-10-CM | POA: Insufficient documentation

## 2020-01-20 DIAGNOSIS — S0003XA Contusion of scalp, initial encounter: Secondary | ICD-10-CM | POA: Diagnosis not present

## 2020-01-20 DIAGNOSIS — E039 Hypothyroidism, unspecified: Secondary | ICD-10-CM | POA: Insufficient documentation

## 2020-01-20 DIAGNOSIS — S199XXA Unspecified injury of neck, initial encounter: Secondary | ICD-10-CM | POA: Diagnosis not present

## 2020-01-20 DIAGNOSIS — R110 Nausea: Secondary | ICD-10-CM | POA: Diagnosis not present

## 2020-01-20 DIAGNOSIS — I6389 Other cerebral infarction: Secondary | ICD-10-CM | POA: Diagnosis not present

## 2020-01-20 DIAGNOSIS — G319 Degenerative disease of nervous system, unspecified: Secondary | ICD-10-CM | POA: Diagnosis not present

## 2020-01-20 DIAGNOSIS — M6281 Muscle weakness (generalized): Secondary | ICD-10-CM | POA: Diagnosis not present

## 2020-01-20 LAB — CBC
HCT: 29.6 % — ABNORMAL LOW (ref 36.0–46.0)
Hemoglobin: 9.6 g/dL — ABNORMAL LOW (ref 12.0–15.0)
MCH: 30.4 pg (ref 26.0–34.0)
MCHC: 32.4 g/dL (ref 30.0–36.0)
MCV: 93.7 fL (ref 80.0–100.0)
Platelets: 288 10*3/uL (ref 150–400)
RBC: 3.16 MIL/uL — ABNORMAL LOW (ref 3.87–5.11)
RDW: 16.9 % — ABNORMAL HIGH (ref 11.5–15.5)
WBC: 7.2 10*3/uL (ref 4.0–10.5)
nRBC: 0 % (ref 0.0–0.2)

## 2020-01-20 LAB — BASIC METABOLIC PANEL
Anion gap: 11 (ref 5–15)
BUN: 18 mg/dL (ref 8–23)
CO2: 18 mmol/L — ABNORMAL LOW (ref 22–32)
Calcium: 8.7 mg/dL — ABNORMAL LOW (ref 8.9–10.3)
Chloride: 105 mmol/L (ref 98–111)
Creatinine, Ser: 1.42 mg/dL — ABNORMAL HIGH (ref 0.44–1.00)
GFR, Estimated: 32 mL/min — ABNORMAL LOW (ref >60)
Glucose, Bld: 117 mg/dL — ABNORMAL HIGH (ref 70–99)
Potassium: 4.2 mmol/L (ref 3.5–5.1)
Sodium: 134 mmol/L — ABNORMAL LOW (ref 135–145)

## 2020-01-20 LAB — PROTIME-INR
INR: 1.7 — ABNORMAL HIGH (ref 0.8–1.2)
Prothrombin Time: 19 seconds — ABNORMAL HIGH (ref 11.4–15.2)

## 2020-01-20 MED ORDER — ACETAMINOPHEN 500 MG PO TABS: 1000.0000 mg | ORAL_TABLET | Freq: Four times a day (QID) | ORAL | 0 refills | Status: AC | PRN

## 2020-01-20 NOTE — ED Notes (Signed)
Patient verbalizes understanding of discharge instructions. Opportunity for questioning and answers were provided. Armband removed by staff, pt discharged from ED via [place].

## 2020-01-20 NOTE — ED Provider Notes (Signed)
[facility] EMERGENCY DEPARTMENT Provider Note   CSN: [id number] Arrival date & time: 01/20/20  1125     History Chief Complaint  Patient presents with  . Trauma    Jade Juarez is a 86 y.o. female.  HPI Patient is brought from [facility].  She was found on the floor in her room.  She was found approximately 45 minutes prior to arrival.  Patient had reported that she was leaning over trying to pick something up and fell forward.  Fall was unwitnessed.  Patient denies any pain.  She does have a large hematoma to the forehead.  Nursing staff reported to EMS that there was a small amount of vomit on the floor nearby.  They however notes that patient does sometimes dry heave and vomit small amounts at baseline.  Patient has been alert and interactive.  She follows commands.  She is at baseline mental status.  EMS reports patient was stable throughout transport.  Blood pressures vital signs stable.  Patient's mental status alert and cooperative.    Past Medical History:  Diagnosis Date  . Anemia    takes Ferrous Sulfate daily  . Anxiety   . Arthritis    right knees  . Atrial fibrillation ([place])    prescribed Eliquis but hasn't started-waiting until after this procedure per [place]  . Depression    takes Zoloft daily  . Diverticulitis    hx of  . Frequent UTI   . GERD (gastroesophageal reflux disease)    hx of-yrs ago  . Headache    occasionally  . Heart murmur   . High cholesterol    takes Atorvastatin daily  . History of bronchitis    about a yr ago  . History of colon polyps   . Hypertension    takes Diltiazem and Diovan daily  . Hypothyroidism    takes Synthroid daily  . IBS (irritable bowel syndrome)    PMH  . Incontinence   . Joint pain   . Joint swelling   . Nocturia   . Overactive bladder   . PONV (postoperative nausea and vomiting)   . Presence of permanent cardiac pacemaker   . Restless leg syndrome   . Status post  placement of implantable loop recorder 05/2014  . Stool incontinence    at times  . Stroke [facility])    takes Plavix daily-right sided weakness  . Vertebrobasilar artery insufficiency   . Wears glasses     Patient Active Problem List   Diagnosis Date Noted  . UTI (urinary tract infection) 07/30/2019  . Basilar artery stenosis 11/14/2014  . Occlusion and stenosis of basilar artery     Past Surgical History:  Procedure Laterality Date  . ABDOMINAL HYSTERECTOMY    . bladder tack     x 2  . BREAST SURGERY Right    cyst removed  . COLONOSCOPY    . EYE SURGERY Bilateral    cataract surgery  . INSERT / REPLACE / REMOVE PACEMAKER    . LOOP RECORDER IMPLANT  05/23/2014   [employer] Reveal loop recorder (Dr. Lisa Roca, [facility])  . RADIOLOGY WITH ANESTHESIA N/A 08/15/2014   Procedure: RADIOLOGY WITH ANESTHESIA;  Surgeon: Luanne Bras, MD;  Location: [place];  Service: Radiology;  Laterality: N/A;  . RADIOLOGY WITH ANESTHESIA N/A 11/05/2014   Procedure: ANGIOPLASTY WITH POSSIBLE STENTING      (RADIOLOGY WITH ANESTHESIA );  Surgeon: Luanne Bras, MD;  Location: [place];  Service: Radiology;  Laterality: N/A;  . RADIOLOGY WITH ANESTHESIA N/A 11/14/2014   Procedure: RADIOLOGY WITH ANESTHESIA;  Surgeon: Luanne Bras, MD;  Location: [place];  Service: Radiology;  Laterality: N/A;     OB History   No obstetric history on file.     Family History  Problem Relation Age of Onset  . Hypertension Mother   . Heart attack Mother   . Cancer Father     Social History   Tobacco Use  . Smoking status: Never Smoker  . Smokeless tobacco: Never Used  Substance Use Topics  . Alcohol use: No  . Drug use: No    Home Medications Prior to Admission medications   Medication Sig Start Date End Date Taking? Authorizing Provider  acetaminophen (TYLENOL) 500 MG tablet Take 500 mg by mouth every 8 (eight) hours as needed for moderate pain.    [provider]  acetaminophen (TYLENOL) 500 MG  tablet Take 2 tablets (1,000 mg total) by mouth every 6 (six) hours as needed. 01/20/20   Charlesetta Shanks, MD  amiodarone (PACERONE) 200 MG tablet Take 200 mg by mouth 2 (two) times daily. 11/02/19   [provider]  apixaban (ELIQUIS) 2.5 MG TABS tablet Take 2.5 mg by mouth 2 (two) times daily.    [provider]  aspirin 81 MG chewable tablet Chew 81 mg by mouth daily.    [provider]  diltiazem (CARDIZEM SR) 120 MG 12 hr capsule Take 120 mg by mouth 2 (two) times daily.  10/29/19   [provider]  donepezil (ARICEPT) 5 MG tablet Take 5 mg by mouth at bedtime.    [provider]  ezetimibe (ZETIA) 10 MG tablet Take 10 mg by mouth daily. 10/21/19   [provider]  ferrous sulfate 325 (65 FE) MG tablet Take 325 mg by mouth 3 (three) times daily with meals.     [provider]  gabapentin (NEURONTIN) 100 MG capsule Take 100 mg by mouth at bedtime.  10/26/19   [provider]  glycerin adult 2 g suppository Place 1 suppository rectally as needed for constipation. 11/15/19   Carollee Leitz, MD  hydrochlorothiazide (HYDRODIURIL) 25 MG tablet Take 25 mg by mouth daily. 10/31/19   [provider]  levofloxacin (LEVAQUIN) 750 MG tablet Take 1 tablet (750 mg total) by mouth daily. 11/27/19   Fredia Sorrow, MD  levothyroxine (SYNTHROID) 100 MCG tablet Take 100 mcg by mouth daily. 10/28/19   [provider]  losartan (COZAAR) 100 MG tablet Take 100 mg by mouth daily. 10/31/19   [provider]  Magnesium 400 MG TABS Take 1 tablet by mouth daily.    [provider]  montelukast (SINGULAIR) 10 MG tablet Take 10 mg by mouth daily.     [provider]  Multiple Vitamin (MULTIVITAMIN) tablet Take 1 tablet by mouth daily.    [provider]  Nutritional Supplements (EQUATE PO) Take 1 tablet by mouth daily as needed (gerd).    [provider]  omeprazole (PRILOSEC) 40 MG capsule Take  40 mg by mouth daily. 11/19/19   [provider]  ondansetron (ZOFRAN ODT) 4 MG disintegrating tablet Take 1 tablet (4 mg total) by mouth every 8 (eight) hours as needed for nausea or vomiting. Patient not taking: Reported on 11/22/2019 08/04/19   Gareth Morgan, MD  polyethylene glycol (MIRALAX / GLYCOLAX) 17 g packet Take 17 g by mouth daily.    [provider]  potassium chloride SA (KLOR-CON) 20  MEQ tablet Take 1 tablet (20 mEq total) by mouth 2 (two) times daily for 3 days. Patient taking differently: Take 20 mEq by mouth as directed. Take on MWF 07/31/19 11/22/19  Maudie Flakes, MD  promethazine (PHENERGAN) 12.5 MG suppository Place 1 suppository (12.5 mg total) rectally every 6 (six) hours as needed for nausea or vomiting. 11/22/19   Drenda Freeze, MD  sertraline (ZOLOFT) 50 MG tablet Take 50 mg by mouth daily.    [provider]    Allergies    Hibiclens [chlorhexidine gluconate], Benicar [olmesartan], Codeine, Erythromycin, Lipitor [atorvastatin], Other, Tetracyclines & related, Vibramycin [doxycycline], and Vicodin [hydrocodone-acetaminophen]  Review of Systems   Review of Systems 10 systems reviewed and negative except as per HPI Physical Exam Updated Vital Signs BP 133/63   Pulse 70   Temp 97.9 F (36.6 C) (Oral)   Resp 16   Ht 5\' 2"  (1.575 m)   Wt 61.2 kg   SpO2 98%   BMI 24.69 kg/m   Physical Exam Constitutional:      Comments: Alert and nontoxic.  Cooperative.  Mental status clear.  GCS 15.  HENT:     Head:     Comments: 5 cm hematoma to left forehead.  Otherwise normocephalic atraumatic    Mouth/Throat:     Pharynx: Oropharynx is clear.  Eyes:     Extraocular Movements: Extraocular movements intact.  Cardiovascular:     Rate and Rhythm: Normal rate and regular rhythm.  Pulmonary:     Effort: Pulmonary effort is normal.     Breath sounds: Normal breath sounds.  Chest:     Chest wall: No tenderness.  Abdominal:     General:  There is no distension.     Palpations: Abdomen is soft.     Tenderness: There is no abdominal tenderness. There is no guarding.  Musculoskeletal:        General: No deformity or signs of injury. Normal range of motion.     Comments: Normal range of motion upper and lower extremities.  Patient can flex and extend at the hip against resistance without pain  Neurological:     General: No focal deficit present.     Mental Status: She is oriented to person, place, and time.     Coordination: Coordination normal.  Psychiatric:        Mood and Affect: Mood normal.     ED Results / Procedures / Treatments   Labs (all labs ordered are listed, but only abnormal results are displayed) Labs Reviewed  BASIC METABOLIC PANEL - Abnormal; Notable for the following components:      Result Value   Sodium 134 (*)    CO2 18 (*)    Glucose, Bld 117 (*)    Creatinine, Ser 1.42 (*)    Calcium 8.7 (*)    GFR, Estimated 32 (*)    All other components within normal limits  CBC - Abnormal; Notable for the following components:   RBC 3.16 (*)    Hemoglobin 9.6 (*)    HCT 29.6 (*)    RDW 16.9 (*)    All other components within normal limits  PROTIME-INR - Abnormal; Notable for the following components:   Prothrombin Time 19.0 (*)    INR 1.7 (*)    All other components within normal limits    EKG EKG Interpretation  Date/Time:  Saturday January 20 2020 11:35:03 EDT Ventricular Rate:  76 PR Interval:    QRS Duration: 150 QT  Interval:  448 QTC Calculation: 504 R Axis:   94 Text Interpretation: Sinus rhythm Short PR interval Right bundle branch block no change from previous Confirmed by Charlesetta Shanks [id number]) on 01/20/2020 4:14:49 PM   Radiology CT Head Wo Contrast  Result Date: 01/20/2020 CLINICAL DATA:  Un witnessed fall from a wheelchair. Hematoma above the left brow. Patient is on Eliquis. EXAM: CT HEAD WITHOUT CONTRAST CT CERVICAL SPINE WITHOUT CONTRAST TECHNIQUE: Multidetector CT  imaging of the head and cervical spine was performed following the standard protocol without intravenous contrast. Multiplanar CT image reconstructions of the cervical spine were also generated. COMPARISON:  11/12/2019 FINDINGS: CT HEAD FINDINGS Brain: No evidence of acute infarction, hemorrhage, hydrocephalus, extra-axial collection or mass lesion/mass effect. Ventricular and sulcal enlargement reflects moderate atrophy. Old left basal gangliar lacunar infarct. Bilateral patchy white matter hypoattenuation consistent with moderate to advanced chronic microvascular ischemic change. These findings are stable. Vascular: No hyperdense vessel or unexpected calcification. Skull: Normal. Negative for fracture or focal lesion. Sinuses/Orbits: Globes and orbits are unremarkable. Visualized sinuses are clear. Other: Left frontal scalp hematoma. CT CERVICAL SPINE FINDINGS Alignment: Mild reversal the normal cervical lordosis centered at C3-C4. No spondylolisthesis/subluxation. Skull base and vertebrae: No acute fracture. No primary bone lesion or focal pathologic process. Soft tissues and spinal canal: No prevertebral fluid or swelling. No visible canal hematoma. Disc levels: Moderate to marked loss of disc height from C3-C4 through C6-C7 with mild spondylotic disc bulging and endplate spurring. No convincing disc herniation. No change. Upper chest: No acute findings. Other: None. IMPRESSION: HEAD CT 1. No acute intracranial abnormalities. 2. No skull fracture.  Left frontal scalp hematoma. CERVICAL CT 1. No fracture or acute finding. Electronically Signed   By: Lajean Manes M.D.   On: 01/20/2020 13:14   CT Cervical Spine Wo Contrast  Result Date: 01/20/2020 CLINICAL DATA:  Un witnessed fall from a wheelchair. Hematoma above the left brow. Patient is on Eliquis. EXAM: CT HEAD WITHOUT CONTRAST CT CERVICAL SPINE WITHOUT CONTRAST TECHNIQUE: Multidetector CT imaging of the head and cervical spine was performed following the  standard protocol without intravenous contrast. Multiplanar CT image reconstructions of the cervical spine were also generated. COMPARISON:  11/12/2019 FINDINGS: CT HEAD FINDINGS Brain: No evidence of acute infarction, hemorrhage, hydrocephalus, extra-axial collection or mass lesion/mass effect. Ventricular and sulcal enlargement reflects moderate atrophy. Old left basal gangliar lacunar infarct. Bilateral patchy white matter hypoattenuation consistent with moderate to advanced chronic microvascular ischemic change. These findings are stable. Vascular: No hyperdense vessel or unexpected calcification. Skull: Normal. Negative for fracture or focal lesion. Sinuses/Orbits: Globes and orbits are unremarkable. Visualized sinuses are clear. Other: Left frontal scalp hematoma. CT CERVICAL SPINE FINDINGS Alignment: Mild reversal the normal cervical lordosis centered at C3-C4. No spondylolisthesis/subluxation. Skull base and vertebrae: No acute fracture. No primary bone lesion or focal pathologic process. Soft tissues and spinal canal: No prevertebral fluid or swelling. No visible canal hematoma. Disc levels: Moderate to marked loss of disc height from C3-C4 through C6-C7 with mild spondylotic disc bulging and endplate spurring. No convincing disc herniation. No change. Upper chest: No acute findings. Other: None. IMPRESSION: HEAD CT 1. No acute intracranial abnormalities. 2. No skull fracture.  Left frontal scalp hematoma. CERVICAL CT 1. No fracture or acute finding. Electronically Signed   By: Lajean Manes M.D.   On: 01/20/2020 13:14    Procedures Procedures (including critical care time)  Medications Ordered in ED Medications - No data to display  ED Course  I  have reviewed the triage vital signs and the nursing notes.  Pertinent labs & imaging results that were available during my care of the patient were reviewed by me and considered in my medical decision making (see chart for details).    MDM  Rules/Calculators/A&P                          Patient presents with fall.  She has large hematoma to the forehead.  She is anticoagulated.  No evidence of intracranial bleeding.  Patient denies headache.  Mental status is sharp and clear.  No evidence of other injury.  She does not have pain in the chest abdomen pelvis or extremities.  This time, stable for discharge back to assisted living. Final Clinical Impression(s) / ED Diagnoses Final diagnoses:  Fall, initial encounter  Traumatic hematoma of forehead, initial encounter  Long term (current) use of anticoagulants    Rx / DC Orders ED Discharge Orders         Ordered    acetaminophen (TYLENOL) 500 MG tablet  Every 6 hours PRN        01/20/20 1625           Charlesetta Shanks, MD 01/20/20 2109

## 2020-01-20 NOTE — ED Triage Notes (Addendum)
Pt bib [facility] from [facility] after unwitnessed fall from wheelchair. Pt on eliquis. Pt has large hematoma above L brow. Pt AOx4, neuro intact, no LOC. Staff did not vomit on floor at site of fall, however, staff states pt has frequent n/v at baseline. EMS VSS.

## 2020-01-20 NOTE — Discharge Instructions (Signed)
1.  Keep head elevated at about 30 degrees while resting or sleeping.  You may try applying a well wrapped ice pack to the swollen hematoma on the forehead.  As this hematoma resolves, there will be a lot of bruising and color change around the lower portions of the face and eye. 2.  Follow head injury instructions.  Return if there are any changes to suggest confusion or decreased level of awareness.

## 2020-01-22 DIAGNOSIS — R278 Other lack of coordination: Secondary | ICD-10-CM | POA: Diagnosis not present

## 2020-01-22 DIAGNOSIS — R2681 Unsteadiness on feet: Secondary | ICD-10-CM | POA: Diagnosis not present

## 2020-01-22 DIAGNOSIS — R296 Repeated falls: Secondary | ICD-10-CM | POA: Diagnosis not present

## 2020-01-22 DIAGNOSIS — M6281 Muscle weakness (generalized): Secondary | ICD-10-CM | POA: Diagnosis not present

## 2020-01-22 DIAGNOSIS — R488 Other symbolic dysfunctions: Secondary | ICD-10-CM | POA: Diagnosis not present

## 2020-01-23 DIAGNOSIS — R488 Other symbolic dysfunctions: Secondary | ICD-10-CM | POA: Diagnosis not present

## 2020-01-23 DIAGNOSIS — R296 Repeated falls: Secondary | ICD-10-CM | POA: Diagnosis not present

## 2020-01-23 DIAGNOSIS — M6281 Muscle weakness (generalized): Secondary | ICD-10-CM | POA: Diagnosis not present

## 2020-01-23 DIAGNOSIS — R2681 Unsteadiness on feet: Secondary | ICD-10-CM | POA: Diagnosis not present

## 2020-01-23 DIAGNOSIS — R278 Other lack of coordination: Secondary | ICD-10-CM | POA: Diagnosis not present

## 2020-01-24 DIAGNOSIS — R488 Other symbolic dysfunctions: Secondary | ICD-10-CM | POA: Diagnosis not present

## 2020-01-24 DIAGNOSIS — R278 Other lack of coordination: Secondary | ICD-10-CM | POA: Diagnosis not present

## 2020-01-24 DIAGNOSIS — R2681 Unsteadiness on feet: Secondary | ICD-10-CM | POA: Diagnosis not present

## 2020-01-24 DIAGNOSIS — R296 Repeated falls: Secondary | ICD-10-CM | POA: Diagnosis not present

## 2020-01-24 DIAGNOSIS — M6281 Muscle weakness (generalized): Secondary | ICD-10-CM | POA: Diagnosis not present

## 2020-01-25 DIAGNOSIS — R278 Other lack of coordination: Secondary | ICD-10-CM | POA: Diagnosis not present

## 2020-01-25 DIAGNOSIS — F0150 Vascular dementia without behavioral disturbance: Secondary | ICD-10-CM | POA: Diagnosis not present

## 2020-01-25 DIAGNOSIS — R488 Other symbolic dysfunctions: Secondary | ICD-10-CM | POA: Diagnosis not present

## 2020-01-25 DIAGNOSIS — M6281 Muscle weakness (generalized): Secondary | ICD-10-CM | POA: Diagnosis not present

## 2020-01-25 DIAGNOSIS — R296 Repeated falls: Secondary | ICD-10-CM | POA: Diagnosis not present

## 2020-01-25 DIAGNOSIS — K5900 Constipation, unspecified: Secondary | ICD-10-CM | POA: Diagnosis not present

## 2020-01-25 DIAGNOSIS — D631 Anemia in chronic kidney disease: Secondary | ICD-10-CM | POA: Diagnosis not present

## 2020-01-25 DIAGNOSIS — S0083XD Contusion of other part of head, subsequent encounter: Secondary | ICD-10-CM | POA: Diagnosis not present

## 2020-01-25 DIAGNOSIS — R1111 Vomiting without nausea: Secondary | ICD-10-CM | POA: Diagnosis not present

## 2020-01-25 DIAGNOSIS — R2681 Unsteadiness on feet: Secondary | ICD-10-CM | POA: Diagnosis not present

## 2020-01-25 DIAGNOSIS — N390 Urinary tract infection, site not specified: Secondary | ICD-10-CM | POA: Diagnosis not present

## 2020-01-31 DIAGNOSIS — S31819S Unspecified open wound of right buttock, sequela: Secondary | ICD-10-CM | POA: Diagnosis not present

## 2020-01-31 DIAGNOSIS — W19XXXA Unspecified fall, initial encounter: Secondary | ICD-10-CM | POA: Diagnosis not present

## 2020-01-31 DIAGNOSIS — R2232 Localized swelling, mass and lump, left upper limb: Secondary | ICD-10-CM | POA: Diagnosis not present

## 2020-01-31 DIAGNOSIS — D509 Iron deficiency anemia, unspecified: Secondary | ICD-10-CM | POA: Diagnosis not present

## 2020-02-03 DIAGNOSIS — N390 Urinary tract infection, site not specified: Secondary | ICD-10-CM | POA: Diagnosis not present

## 2020-02-03 DIAGNOSIS — S31819A Unspecified open wound of right buttock, initial encounter: Secondary | ICD-10-CM | POA: Diagnosis not present

## 2020-02-03 DIAGNOSIS — R2232 Localized swelling, mass and lump, left upper limb: Secondary | ICD-10-CM | POA: Diagnosis not present

## 2020-02-03 DIAGNOSIS — D509 Iron deficiency anemia, unspecified: Secondary | ICD-10-CM | POA: Diagnosis not present

## 2020-02-03 DIAGNOSIS — W19XXXA Unspecified fall, initial encounter: Secondary | ICD-10-CM | POA: Diagnosis not present

## 2020-02-06 DIAGNOSIS — D509 Iron deficiency anemia, unspecified: Secondary | ICD-10-CM | POA: Diagnosis not present

## 2020-02-07 DIAGNOSIS — Z515 Encounter for palliative care: Secondary | ICD-10-CM | POA: Diagnosis not present

## 2020-02-07 DIAGNOSIS — R634 Abnormal weight loss: Secondary | ICD-10-CM | POA: Diagnosis not present

## 2020-02-07 DIAGNOSIS — E039 Hypothyroidism, unspecified: Secondary | ICD-10-CM | POA: Diagnosis not present

## 2020-02-07 DIAGNOSIS — R52 Pain, unspecified: Secondary | ICD-10-CM | POA: Diagnosis not present

## 2020-02-14 DIAGNOSIS — Z515 Encounter for palliative care: Secondary | ICD-10-CM | POA: Diagnosis not present

## 2020-02-14 DIAGNOSIS — F0150 Vascular dementia without behavioral disturbance: Secondary | ICD-10-CM | POA: Diagnosis not present

## 2020-02-14 DIAGNOSIS — I4891 Unspecified atrial fibrillation: Secondary | ICD-10-CM | POA: Diagnosis not present

## 2020-02-14 DIAGNOSIS — D539 Nutritional anemia, unspecified: Secondary | ICD-10-CM | POA: Diagnosis not present

## 2020-02-21 DIAGNOSIS — Z515 Encounter for palliative care: Secondary | ICD-10-CM | POA: Diagnosis not present

## 2020-02-21 DIAGNOSIS — G629 Polyneuropathy, unspecified: Secondary | ICD-10-CM | POA: Diagnosis not present

## 2020-02-21 DIAGNOSIS — R52 Pain, unspecified: Secondary | ICD-10-CM | POA: Diagnosis not present

## 2020-02-21 DIAGNOSIS — E7849 Other hyperlipidemia: Secondary | ICD-10-CM | POA: Diagnosis not present

## 2020-05-07 DEATH — deceased

## 2021-11-15 IMAGING — DX DG CLAVICLE*R*
1 series · 2 of 2 positions shown · non-contrast
Comparison: 05/17/2019 right shoulder radiographs

CLINICAL DATA: Right clavicle pain after fall

EXAM:
RIGHT CLAVICLE - 2+ VIEWS

[Series 1: clavicle · 0.14mm/px · 2 of 2 slices shown]
[im 1/2]
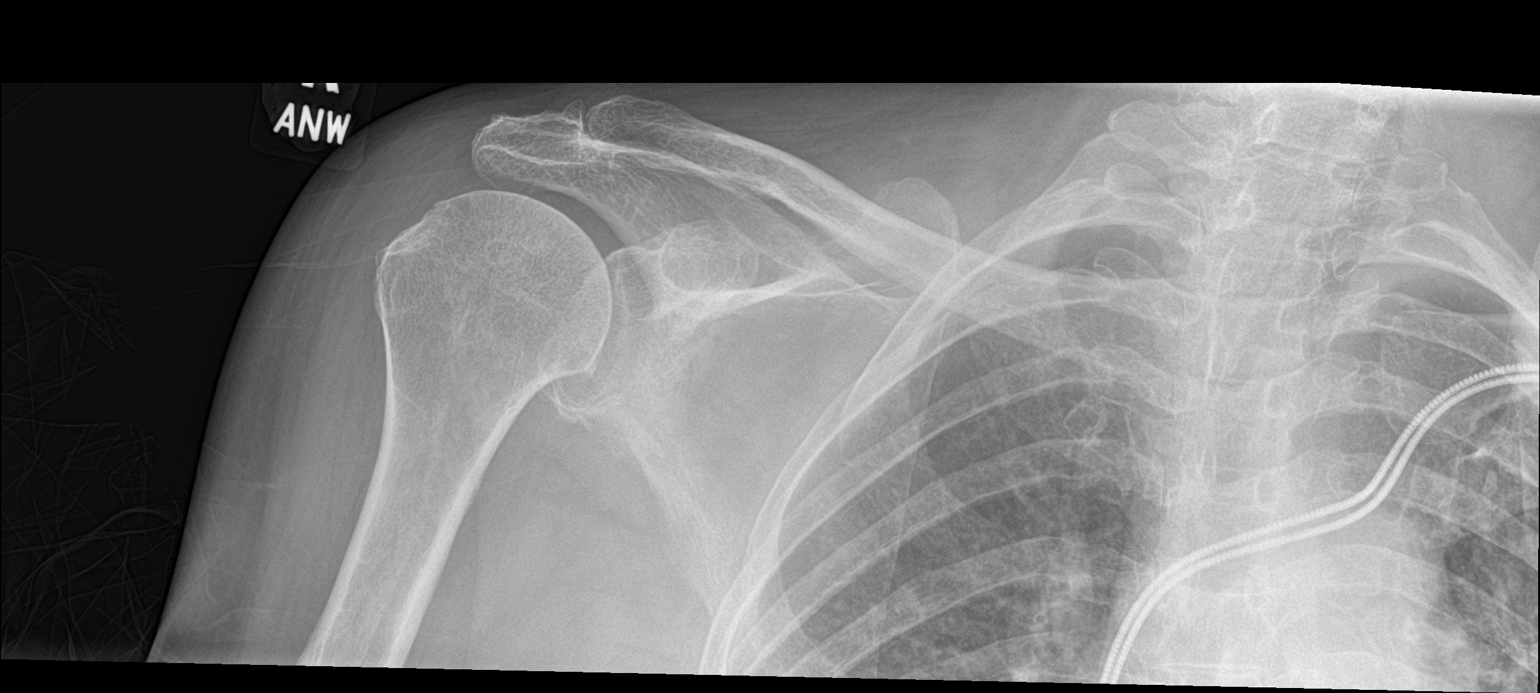
[im 2/2]
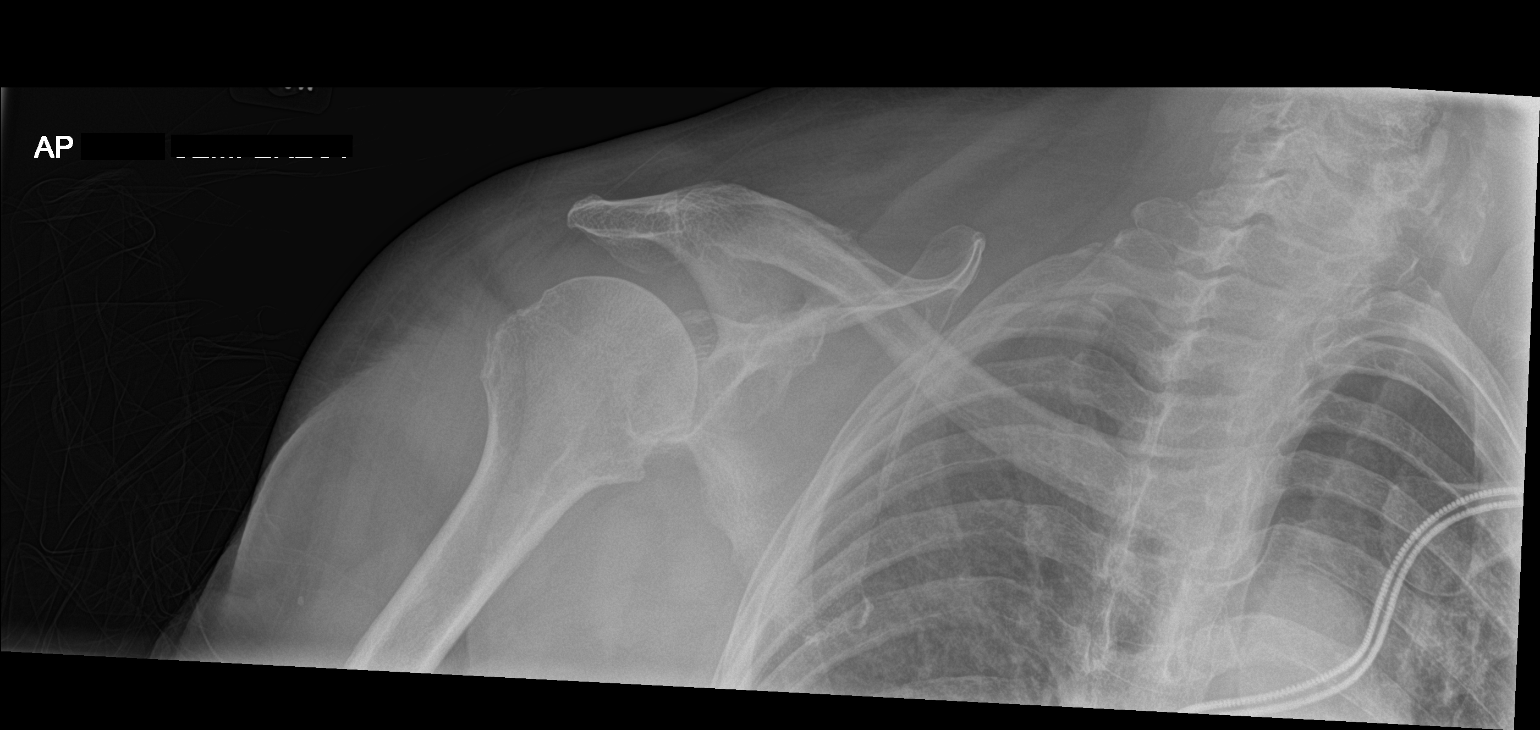

[2 of 2 positions shown; findings below may reference images not displayed]

FINDINGS: No right clavicle fracture. No focal osseous lesions. No evidence of
acromioclavicular separation. Mild right AC joint osteoarthritis.
Partially visualized 2 lead left subclavian pacemaker.
IMPRESSION: No right clavicle fracture. Mild right AC joint osteoarthritis.

## 2021-11-15 IMAGING — CT CT HEAD W/O CM
4 series · 16 of 47 positions shown, 18 images · non-contrast
Comparison: 05/17/2019

CLINICAL DATA: Fall from wheelchair, blood thinner

EXAM:
CT HEAD WITHOUT CONTRAST
CT CERVICAL SPINE WITHOUT CONTRAST
TECHNIQUE: Multidetector CT imaging of the head and cervical spine was
performed following the standard protocol without intravenous
contrast. Multiplanar CT image reconstructions of the cervical spine
were also generated.

[Series 3: head wo · axial · 0.39mm/px · z∈[-96,+20]mm · 7 of 31 slices shown, 9 images]
[im 4/31  brain]
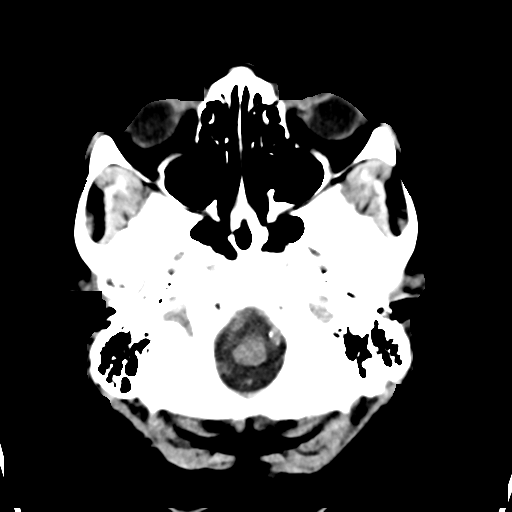
[im 4/31  bone]
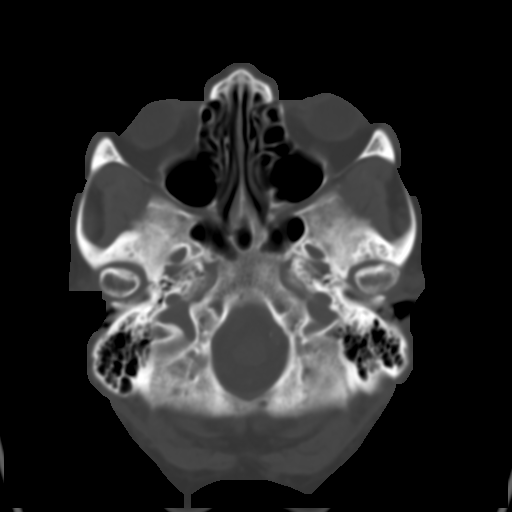
[im 8/31  brain]
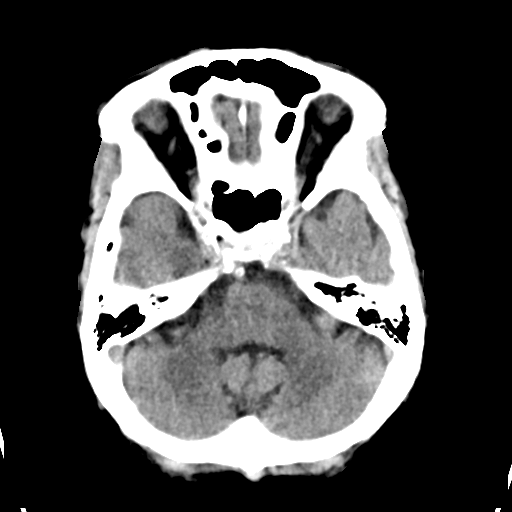
[im 12/31  brain]
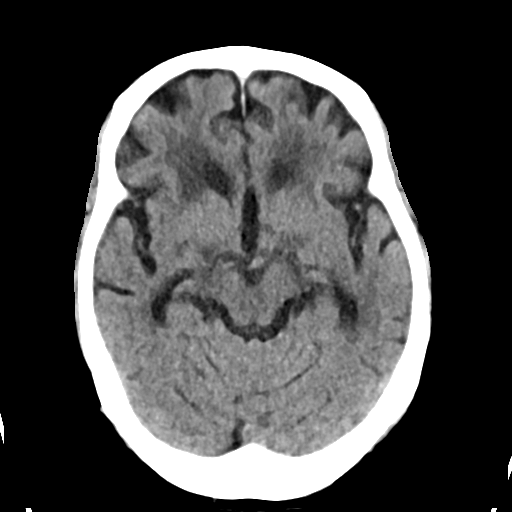
[im 16/31  brain]
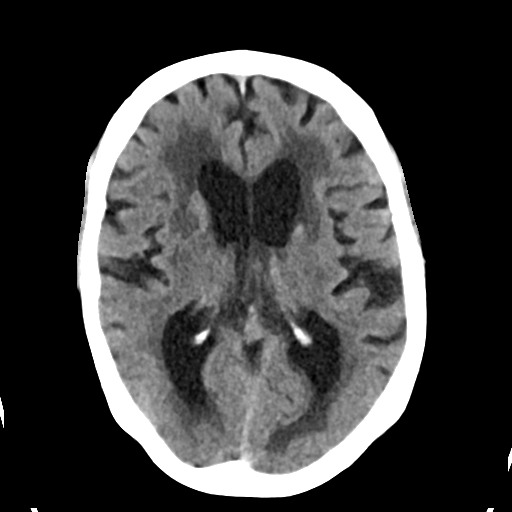
[im 19/31  brain]
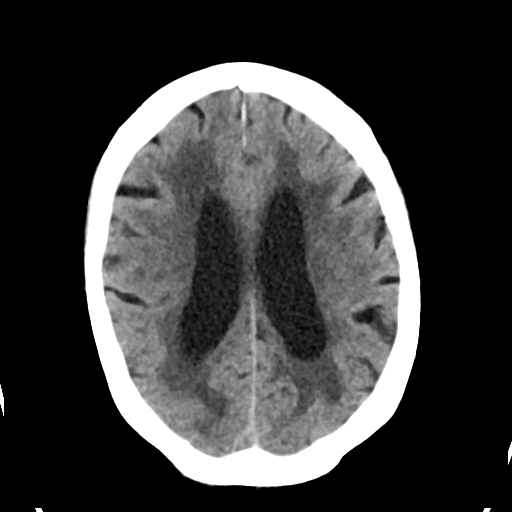
[im 19/31  bone]
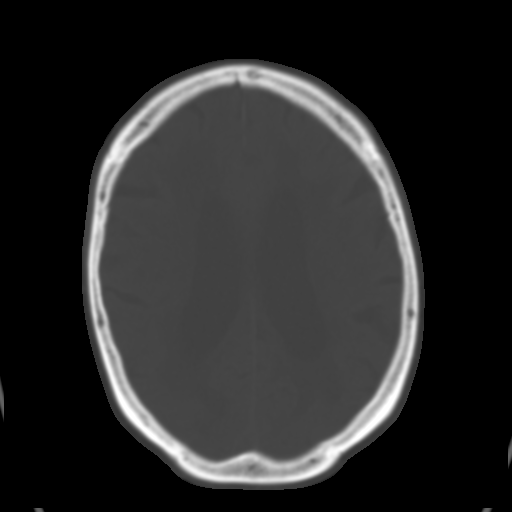
[im 23/31  brain]
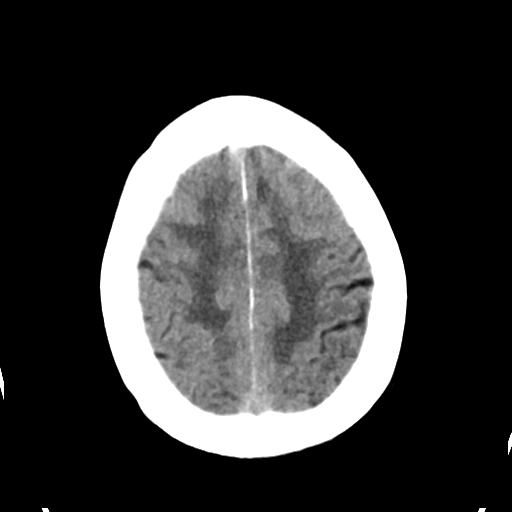
[im 27/31  brain]
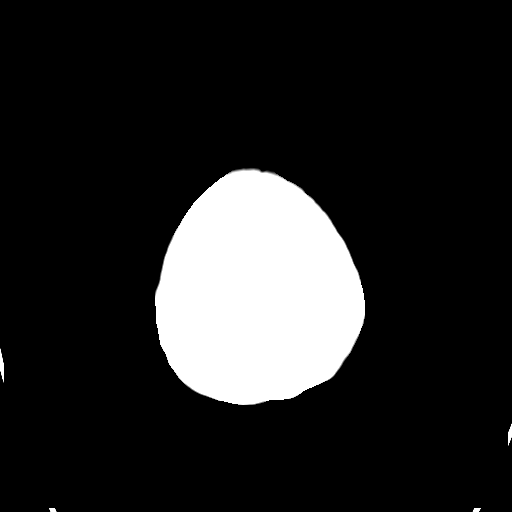

[Series 4: head bone · axial · 0.39mm/px · z∈[-96,-64]mm · 3 of 78 slices shown]
[im 8/78  bone]
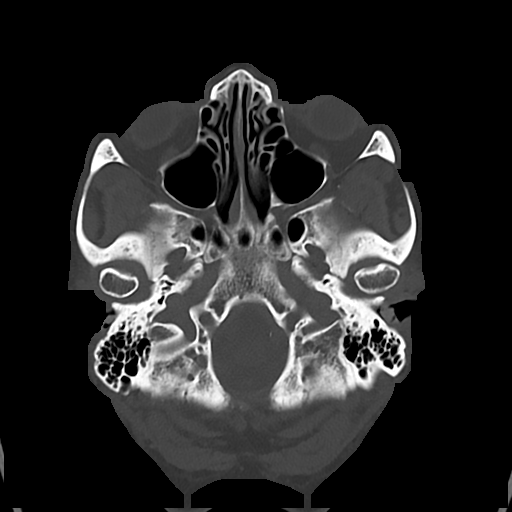
[im 16/78  bone]
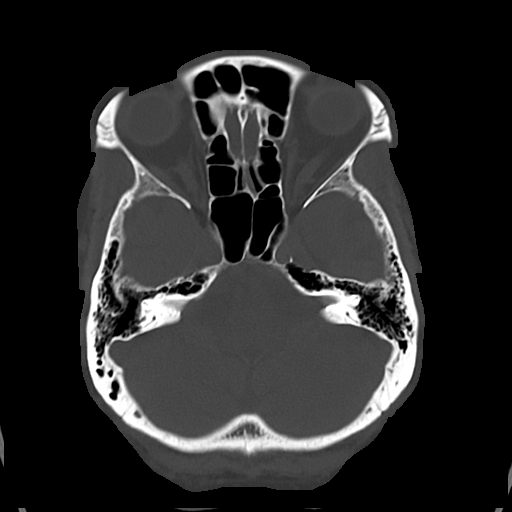
[im 24/78  bone]
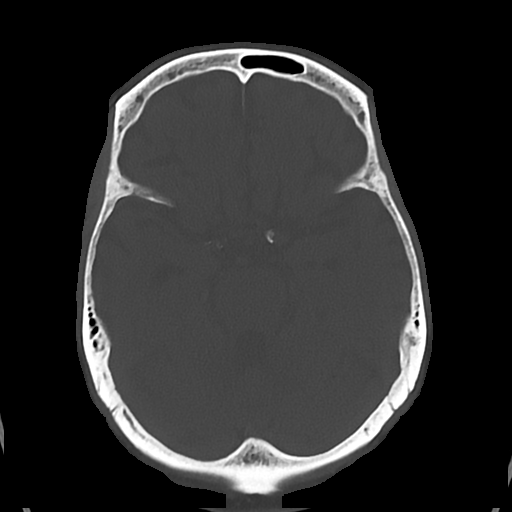

[Series 5: cor soft · coronal · 0.30mm/px · 3 of 63 slices shown]
[im 21/63  brain]
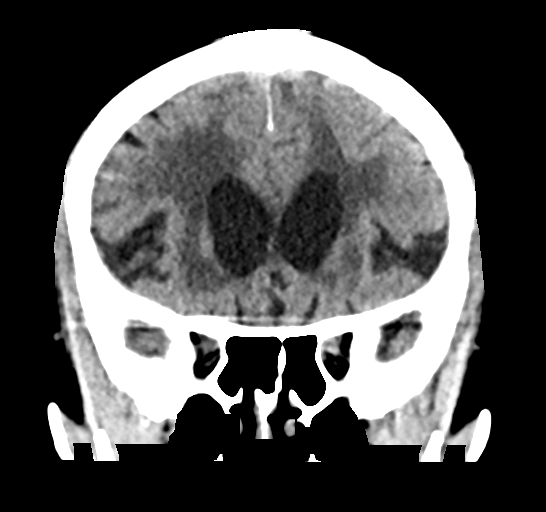
[im 28/63  brain]
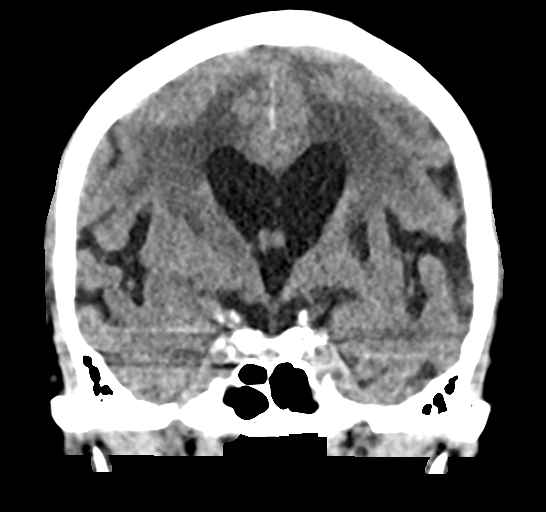
[im 35/63  brain]
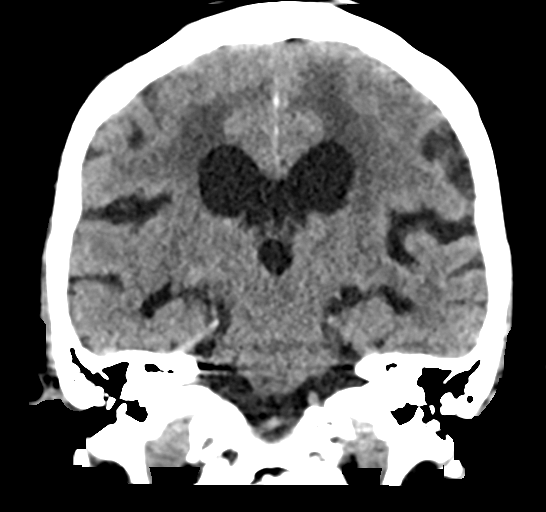

[Series 6: sag soft · sagittal · 0.30mm/px · 3 of 55 slices shown]
[im 19/55  brain]
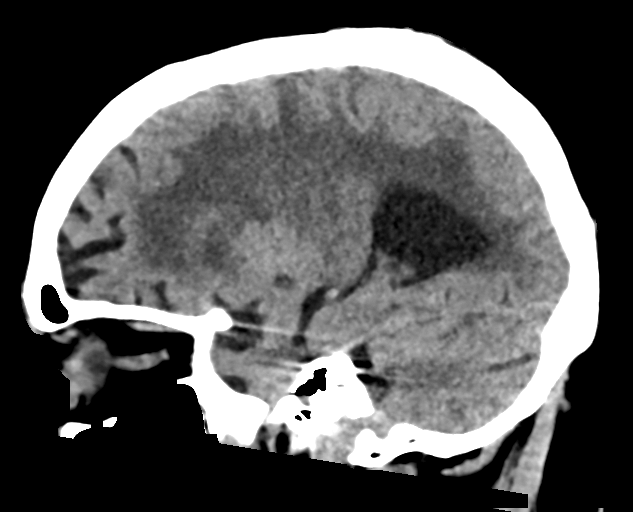
[im 28/55  brain]
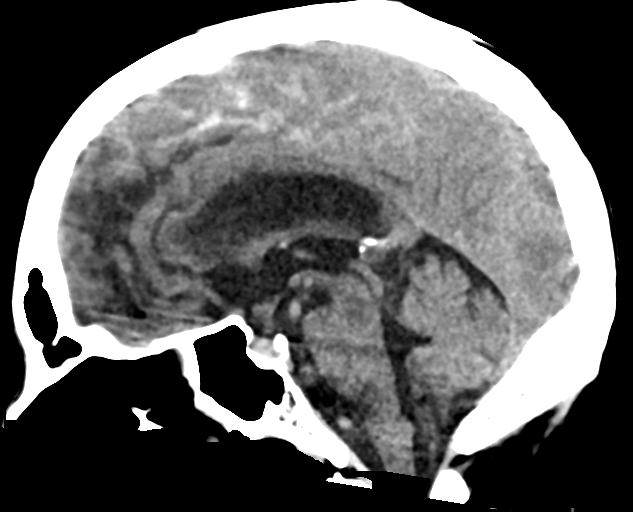
[im 37/55  brain]
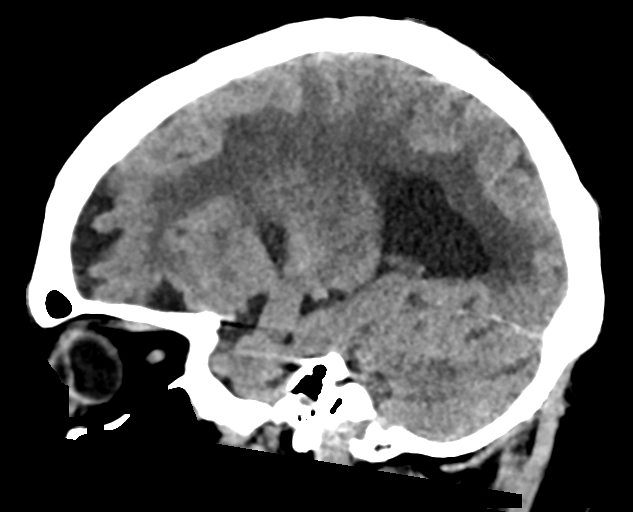

[16 of 47 positions shown; findings below may reference images not displayed]

FINDINGS: CT HEAD FINDINGS

Brain: No evidence of acute infarction, hemorrhage, hydrocephalus,
extra-axial collection or mass lesion/mass effect. Very extensive
periventricular and deep white matter hypodensity with moderate
global volume loss. Nonacute lacunar infarction of the left
lentiform nuclei.

Vascular: No hyperdense vessel or unexpected calcification.

Skull: Normal. Negative for fracture or focal lesion.

Sinuses/Orbits: No acute finding.

Other: None.

CT CERVICAL SPINE FINDINGS

Alignment: Straightening of the normal cervical lordosis.

Skull base and vertebrae: No acute fracture. No primary bone lesion
or focal pathologic process.

Soft tissues and spinal canal: No prevertebral fluid or swelling. No
visible canal hematoma.

Disc levels: Moderate to severe multilevel disc degenerative disease
of the cervical spine.

Upper chest: Negative.

Other: None.
IMPRESSION: 1. No acute intracranial pathology.
2. Advanced small-vessel white matter disease and global volume
loss. Nonacute lacunar infarction of the left lentiform nuclei.
3. No fracture or static subluxation of the cervical spine.
4. Moderate to severe multilevel disc degenerative disease of the
cervical spine.
